# Patient Record
Sex: Female | Born: 1970 | Race: White | Hispanic: No | Marital: Married | Smoking: Never smoker
Health system: Southern US, Community
[De-identification: ages and names within clinical notes are randomized; demographics above are authoritative.]

## PROBLEM LIST (undated history)

## (undated) DIAGNOSIS — F419 Anxiety disorder, unspecified: Secondary | ICD-10-CM

## (undated) DIAGNOSIS — C44310 Basal cell carcinoma of skin of unspecified parts of face: Secondary | ICD-10-CM

## (undated) HISTORY — PX: MOHS SURGERY: SUR867

## (undated) HISTORY — DX: Basal cell carcinoma of skin of unspecified parts of face: C44.310

---

## 2002-08-22 ENCOUNTER — Other Ambulatory Visit: Admission: RE | Admit: 2002-08-22 | Discharge: 2002-08-22 | Payer: Self-pay | Admitting: Obstetrics and Gynecology

## 2003-01-23 ENCOUNTER — Encounter: Admission: RE | Admit: 2003-01-23 | Discharge: 2003-01-23 | Payer: Self-pay | Admitting: Obstetrics and Gynecology

## 2003-02-02 ENCOUNTER — Ambulatory Visit (HOSPITAL_COMMUNITY): Admission: RE | Admit: 2003-02-02 | Discharge: 2003-02-02 | Payer: Self-pay | Admitting: Obstetrics and Gynecology

## 2003-02-02 ENCOUNTER — Encounter: Payer: Self-pay | Admitting: Obstetrics and Gynecology

## 2003-03-08 ENCOUNTER — Inpatient Hospital Stay (HOSPITAL_COMMUNITY): Admission: AD | Admit: 2003-03-08 | Discharge: 2003-03-10 | Payer: Self-pay | Admitting: Obstetrics and Gynecology

## 2004-05-19 ENCOUNTER — Other Ambulatory Visit: Admission: RE | Admit: 2004-05-19 | Discharge: 2004-05-19 | Payer: Self-pay | Admitting: Obstetrics and Gynecology

## 2004-05-19 ENCOUNTER — Other Ambulatory Visit: Admission: RE | Admit: 2004-05-19 | Discharge: 2004-05-19 | Payer: Self-pay | Admitting: *Deleted

## 2004-12-07 ENCOUNTER — Inpatient Hospital Stay (HOSPITAL_COMMUNITY): Admission: AD | Admit: 2004-12-07 | Discharge: 2004-12-09 | Payer: Self-pay | Admitting: Obstetrics and Gynecology

## 2004-12-11 ENCOUNTER — Encounter: Admission: RE | Admit: 2004-12-11 | Discharge: 2005-01-10 | Payer: Self-pay | Admitting: Obstetrics and Gynecology

## 2005-01-11 ENCOUNTER — Encounter: Admission: RE | Admit: 2005-01-11 | Discharge: 2005-02-10 | Payer: Self-pay | Admitting: Obstetrics and Gynecology

## 2005-03-11 ENCOUNTER — Encounter: Admission: RE | Admit: 2005-03-11 | Discharge: 2005-04-10 | Payer: Self-pay | Admitting: Obstetrics and Gynecology

## 2005-05-11 ENCOUNTER — Encounter: Admission: RE | Admit: 2005-05-11 | Discharge: 2005-06-10 | Payer: Self-pay | Admitting: Obstetrics and Gynecology

## 2005-07-11 ENCOUNTER — Encounter: Admission: RE | Admit: 2005-07-11 | Discharge: 2005-08-10 | Payer: Self-pay | Admitting: Obstetrics and Gynecology

## 2005-08-11 ENCOUNTER — Encounter: Admission: RE | Admit: 2005-08-11 | Discharge: 2005-09-09 | Payer: Self-pay | Admitting: Obstetrics and Gynecology

## 2005-11-23 DIAGNOSIS — C44310 Basal cell carcinoma of skin of unspecified parts of face: Secondary | ICD-10-CM

## 2005-11-23 HISTORY — DX: Basal cell carcinoma of skin of unspecified parts of face: C44.310

## 2006-01-08 ENCOUNTER — Other Ambulatory Visit: Admission: RE | Admit: 2006-01-08 | Discharge: 2006-01-08 | Payer: Self-pay | Admitting: Obstetrics and Gynecology

## 2008-04-09 ENCOUNTER — Inpatient Hospital Stay (HOSPITAL_COMMUNITY): Admission: AD | Admit: 2008-04-09 | Discharge: 2008-04-12 | Payer: Self-pay | Admitting: Obstetrics and Gynecology

## 2008-04-10 ENCOUNTER — Encounter (INDEPENDENT_AMBULATORY_CARE_PROVIDER_SITE_OTHER): Payer: Self-pay | Admitting: Obstetrics and Gynecology

## 2008-10-24 ENCOUNTER — Encounter: Admission: RE | Admit: 2008-10-24 | Discharge: 2008-10-24 | Payer: Self-pay | Admitting: Family Medicine

## 2008-11-12 ENCOUNTER — Ambulatory Visit: Payer: Self-pay | Admitting: Occupational Medicine

## 2008-11-12 DIAGNOSIS — J45909 Unspecified asthma, uncomplicated: Secondary | ICD-10-CM

## 2009-04-11 ENCOUNTER — Encounter: Admission: RE | Admit: 2009-04-11 | Discharge: 2009-04-11 | Payer: Self-pay | Admitting: Obstetrics and Gynecology

## 2009-07-30 ENCOUNTER — Ambulatory Visit: Payer: Self-pay | Admitting: Family Medicine

## 2009-07-30 DIAGNOSIS — F411 Generalized anxiety disorder: Secondary | ICD-10-CM

## 2009-07-30 DIAGNOSIS — Z8742 Personal history of other diseases of the female genital tract: Secondary | ICD-10-CM

## 2009-10-14 ENCOUNTER — Ambulatory Visit: Payer: Self-pay | Admitting: Sports Medicine

## 2009-10-14 DIAGNOSIS — M779 Enthesopathy, unspecified: Secondary | ICD-10-CM | POA: Insufficient documentation

## 2009-10-14 DIAGNOSIS — IMO0002 Reserved for concepts with insufficient information to code with codable children: Secondary | ICD-10-CM | POA: Insufficient documentation

## 2009-12-12 ENCOUNTER — Encounter: Admission: RE | Admit: 2009-12-12 | Discharge: 2009-12-12 | Payer: Self-pay | Admitting: Obstetrics and Gynecology

## 2010-02-13 ENCOUNTER — Ambulatory Visit: Payer: Self-pay | Admitting: Family Medicine

## 2010-02-14 ENCOUNTER — Encounter (INDEPENDENT_AMBULATORY_CARE_PROVIDER_SITE_OTHER): Payer: Self-pay | Admitting: *Deleted

## 2010-03-04 ENCOUNTER — Ambulatory Visit (HOSPITAL_COMMUNITY): Payer: Self-pay | Admitting: Licensed Clinical Social Worker

## 2010-03-18 ENCOUNTER — Ambulatory Visit: Payer: Self-pay | Admitting: Family Medicine

## 2010-03-20 ENCOUNTER — Ambulatory Visit (HOSPITAL_COMMUNITY): Payer: Self-pay | Admitting: Licensed Clinical Social Worker

## 2010-05-20 ENCOUNTER — Ambulatory Visit (HOSPITAL_COMMUNITY): Payer: Self-pay | Admitting: Licensed Clinical Social Worker

## 2010-06-10 ENCOUNTER — Ambulatory Visit (HOSPITAL_COMMUNITY): Payer: Self-pay | Admitting: Psychiatry

## 2010-06-19 ENCOUNTER — Ambulatory Visit: Payer: Self-pay | Admitting: Family Medicine

## 2010-06-24 ENCOUNTER — Ambulatory Visit: Payer: Self-pay | Admitting: Sports Medicine

## 2010-06-24 DIAGNOSIS — M79609 Pain in unspecified limb: Secondary | ICD-10-CM

## 2010-06-24 DIAGNOSIS — R209 Unspecified disturbances of skin sensation: Secondary | ICD-10-CM

## 2010-06-27 ENCOUNTER — Ambulatory Visit: Payer: Self-pay | Admitting: Emergency Medicine

## 2010-06-27 DIAGNOSIS — S058X9A Other injuries of unspecified eye and orbit, initial encounter: Secondary | ICD-10-CM | POA: Insufficient documentation

## 2010-08-18 ENCOUNTER — Ambulatory Visit: Payer: Self-pay | Admitting: Family Medicine

## 2010-08-18 DIAGNOSIS — M25559 Pain in unspecified hip: Secondary | ICD-10-CM | POA: Insufficient documentation

## 2010-08-22 ENCOUNTER — Ambulatory Visit (HOSPITAL_COMMUNITY): Admission: RE | Admit: 2010-08-22 | Discharge: 2010-08-22 | Payer: Self-pay | Admitting: Family Medicine

## 2010-09-01 ENCOUNTER — Ambulatory Visit: Payer: Self-pay | Admitting: Family Medicine

## 2010-09-16 ENCOUNTER — Encounter: Admission: RE | Admit: 2010-09-16 | Discharge: 2010-10-14 | Payer: Self-pay | Admitting: Orthopedic Surgery

## 2010-09-17 DIAGNOSIS — N83202 Unspecified ovarian cyst, left side: Secondary | ICD-10-CM | POA: Insufficient documentation

## 2010-09-22 ENCOUNTER — Encounter: Payer: Self-pay | Admitting: Family Medicine

## 2010-10-03 ENCOUNTER — Ambulatory Visit: Payer: Self-pay | Admitting: Family Medicine

## 2010-10-04 ENCOUNTER — Encounter: Payer: Self-pay | Admitting: Family Medicine

## 2010-10-05 ENCOUNTER — Encounter: Payer: Self-pay | Admitting: Family Medicine

## 2010-10-07 ENCOUNTER — Telehealth (INDEPENDENT_AMBULATORY_CARE_PROVIDER_SITE_OTHER): Payer: Self-pay | Admitting: *Deleted

## 2010-12-23 NOTE — Progress Notes (Signed)
  Phone Note Outgoing Call Call back at [facility] Phone [phone]   Call placed by: Lajean Saver RN,  October 07, 2010 12:32 PM Call placed to: Patient Action Taken: Phone Call Completed Summary of Call: Callback: Patient not available, left message with family member for her to call back if she has any questions or concerns

## 2010-12-23 NOTE — Assessment & Plan Note (Signed)
Summary: EYE PAIN/KH   Vital Signs:  Patient Profile:   40 Years Old Female CC:      right eye scratch from bug x last night Height:     66 inches Weight:      154 pounds O2 Sat:      99 % O2 treatment:    Room Air Temp:     98 degrees F oral Pulse rate:   65 / minute Resp:     14 per minute BP sitting:   97 / 54  (right arm) Cuff size:   regular  Pt. in pain?   yes    Location:   right eye    Type:       burning  Vitals Entered By: Lajean Saver RN (June 27, 2010 8:15 AM)                   Updated Prior Medication List: SERTRALINE HCL 50 MG TABS (SERTRALINE HCL) Take 1 tablet by mouth once a day PROAIR HFA 108 (90 BASE) MCG/ACT AERS (ALBUTEROL SULFATE) inhale 2 puffs as needed MIRENA 20 MCG/24HR IUD (LEVONORGESTREL)   Current Allergies (reviewed today): No known allergies History of Present Illness Chief Complaint: right eye scratch from bug x last night History of Present Illness: Was running last night and a bug flew into her eye.  She went right home and washed it out.  Afterwards felt like either it was still in there or that it was scratched.  Eye was crusty and watery this morning.  Washing it out made it feel better but she is still blinking. No swelling, redness, but mild discomfort.  REVIEW OF SYSTEMS Constitutional Symptoms      Denies fever, chills, night sweats, weight loss, weight gain, and fatigue.  Eyes       Complains of eye pain and eye drainage.      Denies change in vision, glasses, contact lenses, and eye surgery.      Comments: right eye Ear/Nose/Throat/Mouth       Denies hearing loss/aids, change in hearing, ear pain, ear discharge, dizziness, frequent runny nose, frequent nose bleeds, sinus problems, sore throat, hoarseness, and tooth pain or bleeding.  Respiratory       Denies dry cough, productive cough, wheezing, shortness of breath, asthma, bronchitis, and emphysema/COPD.  Cardiovascular       Denies murmurs, chest pain, and tires easily  with exhertion.    Gastrointestinal       Denies stomach pain, nausea/vomiting, diarrhea, constipation, blood in bowel movements, and indigestion. Genitourniary       Denies painful urination, kidney stones, and loss of urinary control. Neurological       Denies paralysis, seizures, and fainting/blackouts. Musculoskeletal       Denies muscle pain, joint pain, joint stiffness, decreased range of motion, redness, swelling, muscle weakness, and gout.  Skin       Denies bruising, unusual mles/lumps or sores, and hair/skin or nail changes.  Psych       Denies mood changes, temper/anger issues, anxiety/stress, speech problems, depression, and sleep problems. Other Comments: patient went out to run last night and bug flew into her right eye. After immediate rinsing she feels as if it is scratched and is painful to open her eye   Past History:  Past Medical History: Reviewed history from 07/30/2009 and no changes required. 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    Past Surgical History: Reviewed history from 07/30/2009 and no changes required.  Moh's surgery to remove Basal Cell Carcinoma on nose  Family History: Reviewed history from 07/30/2009 and no changes required. Mother alive and healthy Father alive high cholesterol, HTN otherwise healthy Brother alive and healthy Sister alive anxiety, depression  otherwise healthy Ucnle with depression.  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  [place] with 2 duaghters and one son.   Never Smoked Alcohol use-yes, 1-2 Drug use-no Regular exercise-yes, running. 3x/wk Physical Exam General appearance: well developed, well nourished, no acute distress Eyes: conjunctivae and lids normal Pupils: equal, round, reactive to light Chest/Lungs: no rales, wheezes, or rhonchi bilateral, breath sounds equal without effort Heart: regular rate and  rhythm, no  murmur Right fluoroscein eye exam shows very small corneal abrasion at 6 o'clock on sclera ( ~83mm).  No dendrites, no ulcers.  No foreign bodies.  No erythema, or swelling.  Assessment New Problems: CORNEAL ABRASION, RIGHT (ICD-918.1)   Plan New Medications/Changes: CILOXAN 0.3 % SOLN (CIPROFLOXACIN HCL) 2 drops in right eye Q6 hours for 10 days  #1 bottle x 0, 06/27/2010, Hoyt Koch MD  New Orders: Est. Patient Level III [phone] Planning Comments:   Use drops as directed If any visual problems, increased redness, increased pain, go to ER or follow up with ophthomology   The patient and/or caregiver has been counseled thoroughly with regard to medications prescribed including dosage, schedule, interactions, rationale for use, and possible side effects and they verbalize understanding.  Diagnoses and expected course of recovery discussed and will return if not improved as expected or if the condition worsens. Patient and/or caregiver verbalized understanding.  Prescriptions: CILOXAN 0.3 % SOLN (CIPROFLOXACIN HCL) 2 drops in right eye Q6 hours for 10 days  #1 bottle x 0   Entered and Authorized by:   Hoyt Koch MD   Signed by:   Hoyt Koch MD on 06/27/2010   Method used:   Printed then faxed to ...       [facility]* (retail)       [address]       [address] Shipping/mailing       [place], [place]  [address]       Ph: [phone]       Fax: [phone]   RxID:   [id number]   Orders Added: 1)  Est. Patient Level III [66440]

## 2010-12-23 NOTE — Assessment & Plan Note (Signed)
Summary: FEET NUMBNESS x 2 mos   Vital Signs:  Patient profile:   40 year old female BP sitting:   98 / 62  Vitals Entered By: Lillia Pauls CMA (June 24, 2010 11:01 AM)  Primary Provider:  Nani Gasser, MD   History of Present Illness: c/o foot numbness on both feet more likely left first last year w old shoes/ loosened laces and went away  last couple of months getting worse again  training for marathon now 20 to 25 MPW numbness is worse on longer runs  starts at 2 miles/ this past sunday both feet went numb retied laces several times/ gave herself a 10 min rest at 4 miles/ picked up heels more and was able to run 4 miles w no problems at end  Allergies: No Known Drug Allergies  Physical Exam  General:  Well-developed,well-nourished,in no acute distress; alert,appropriate and cooperative throughout examination Msk:  RT and LT ankles shows no swelling; stable lateral and medial ligaments; squeeze test and kleiger test unremarkable; talar dome seems nontender; no sign of peroneal tendon subluxations; no pain at base of 5th MT.  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  Take 1 tablet by mouth once a day 2)  Retin-a 0.05 % Crea (Tretinoin) .... Apply one time a day 3)  Proair Hfa 108 (90 Base) Mcg/act Aers (Albuterol sulfate) .... Inhale 2 puffs as needed 4)  Propranolol Hcl 10 Mg Tabs (Propranolol hcl) .... Take one tablet by mouth twice a day as needed public speaking 5)  Mirena 20 Mcg/24hr Iud (Levonorgestrel)  Patient Instructions: 1)  use foot wedge w new insoles for 1 month as trial 2)  relace shoes 3)  try some calf raises with knee straight and knee bent 4)  do 1 to 3 sets of 15 on a step 5)  let me know how this works

## 2010-12-23 NOTE — Letter (Signed)
Summary: Primary Care Consult Scheduled Letter  [facility]  [address]   [place], [place] [address]   Phone: [phone]  Fax: [phone]      02/14/2010 MRN: [id number]  [facility] [address] [place], [place]  [address]    Dear Ms. Piacente,    We have scheduled an appointment for you.  At the recommendation of Dr.Metheney , we have scheduled you a consult with [facility] , Merlene Morse on April  12,2011 at 1:30pm .  [facility], [place] . The office phone number is [phone].  If this appointment day and time is not convenient for you, please feel free to call the office of the doctor you are being referred to at the number listed above and reschedule the appointment.     It is important for you to keep your scheduled appointments. We are here to make sure you are given good patient care.     Thank you, [facility] Patient Care Coordinator [place]

## 2010-12-23 NOTE — Letter (Signed)
Summary: Depression & Anxiety Questionnaire/[place] Kathryne Sharper  Depression & Anxiety Questionnaire/[place] Kathryne Sharper   Imported By: Lanelle Bal 03/28/2010 12:58:35  _____________________________________________________________________  External Attachment:    Type:   Image     Comment:   External Document

## 2010-12-23 NOTE — Assessment & Plan Note (Signed)
Summary: LEG PAIN FROM RUNNING,MC   Vital Signs:  Patient profile:   40 year old female Pulse rate:   62 / minute BP sitting:   124 / 80  (right arm)  Vitals Entered By: Lillia Pauls CMA (August 18, 2010 2:18 PM) CC: rt leg pain from knee to hip   Primary Care Provider:  Nani Gasser, MD  CC:  rt leg pain from knee to hip.  History of Present Illness: 1. Right leg pain:  Pt has been training for a marathon and has been increasing her mileage.  She was up to about 45 miles per week.  Last sunday she was going for a 15 mile run.  She began noticing some right leg pain around her hip at the last 3 miles.  After that she just had an achey pain that is hard to localize in her right leg from her hip to her knee.  She has gone running twice since last week but cannot go long distances because of the pain.  Pain is reproduced with running or jumping.  Pain is rated a 3/10.  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    Past Surgical History: Last updated: 07/30/2009 Moh's surgery to remove Basal Cell Carcinoma on nose  Family History: Last updated: 07/30/2009 Mother alive and healthy Father alive high cholesterol, HTN otherwise healthy Brother alive and healthy Sister alive anxiety, depression  otherwise healthy Ucnle with depression.  MGF with Diabetes PGF stroke  Social History: Reviewed history from 06/27/2010 and no changes required. 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  [place] with 2 duaghters and one son.   Never Smoked Alcohol use-yes, 1-2 Drug use-no Regular exercise-yes, running. 3x/wk  Physical Exam  General:  well appearing, no acute distress Msk:  Back:  Full  ROM, no swelling or pain  Right hip:  Full ROM, slightly decreased internal rotation.  Nontender to palpation over the hip joint or surrounding muscles.  Pain reproduced with jump test and short jog.  5/5 strength to hip flexors, abductors, and extensors  Right leg:  no swelling, redness or deformity.  Nontender to palpation  Right knee:  no swelling, redness or deformity.  No joint line tenderness.  Full ROM (full extension / flexion).  Neg McMurrays.  GAIT: running gait short stride length but symmetrical. no crossover of her knees from midline. heel-midfoot striker walking gait--not antalgic Neurologic:  sensation intact to soft touch B LE    Impression & Recommendations:  Problem # 1:  HIP PAIN, RIGHT (ICD-719.45) Assessment New Concerning for stress fracture given history of increased mileage and diffuse pain with impact.  Will send pt for MRI to evaluate.  Advised pt to do nonimpact training and no further running until MRI is obtained. Orders: MRI without Contrast (MRI w/o Contrast)  Complete Medication List: 1)  Sertraline Hcl 50 Mg Tabs (Sertraline hcl) .... Take 1 tablet by mouth once a day 2)  Proair Hfa 108 (90 Base) Mcg/act Aers (Albuterol sulfate) .... Inhale 2 puffs as needed 3)  Mirena 20 Mcg/24hr Iud (Levonorgestrel) 4)  Ciloxan 0.3 % Soln (Ciprofloxacin hcl) .... 2 drops in right eye q6 hours for 10 days 5)  Valium 5 Mg Tabs (Diazepam) .Marland KitchenMarland KitchenMarland Kitchen  1 by mouth q 30 minutes prior to procedure and may repeat x 1  Patient Instructions: 1)  MRI AT [facility] ON [facility], SEPT 30TH AT 9AM. [phone] Prescriptions: VALIUM 5 MG TABS (DIAZEPAM) 1 by mouth q 30 minutes prior to procedure and may repeat x 1  #2 x 0   Entered and Authorized by:   Denny Levy MD   Signed by:   Denny Levy MD on 08/18/2010   Method used:   Telephoned to ...       [facility]  [address] [phone]* (retail)       [address] [place], [place]  [address]       Ph: [phone] or [phone]       Fax: [phone]    RxID:   [id number]

## 2010-12-23 NOTE — Assessment & Plan Note (Signed)
Summary: Anxiety getting worse   Vital Signs:  Patient profile:   40 year old female Height:      66 inches Weight:      146.25 pounds BMI:     23.69 Pulse rate:   71 / minute Pulse rhythm:   regular BP sitting:   114 / 67  (left arm) Cuff size:   regular  Vitals Entered By: Mervin Kung CMA (February 13, 2010 2:54 PM) CC: room 5  Pt would like to discuss alternative to Zoloft. States she is having allergy flare up and would like rx. Also needs refill on propranolol today.   Primary Care Provider:  Nani Gasser, MD  CC:  room 5  Pt would like to discuss alternative to Zoloft. States she is having allergy flare up and would like rx. Also needs refill on propranolol today.Marland Kitchen  History of Present Illness: room 5  Pt would like to discuss alternative to Zoloft. States she is having allergy flare up and would like rx. Also needs refill on propranolol today. Has been on the zoloft consistantly for 2 years.  has alot of stress at work but feels liek she needs something for her mood. Not sleeping well lately. Can't turn her worries off at night.  Doesn' t feel down. Feels very stresses.  Feels her panic is getting worse. She is potentially interested in counseling but she i snot sure.   Allergies (verified): No Known Drug Allergies  Physical Exam  General:  Well-developed,well-nourished,in no acute distress; alert,appropriate and cooperative throughout examination Psych:  Cognition and judgment appear intact. Alert and cooperative with normal attention span and concentration. No apparent delusions, illusions, hallucinations   Impression & Recommendations:  Problem # 1:  ANXIETY DISORDER, GENERALIZED (ICD-300.02)  GAD-7 score today is  12 (moderate) PHQ-9 score today is 8 (mild). Discussed restarting teh sertraline but making sure getting to a therapuetic dose of 100mg .  Will refer for counseling. F/U in 3-4 weeks to make sure doing wel. Will repeat the GAD-7 at next visit.  I  think the counseling will help her more long term.   Her updated medication list for this problem includes:    Sertraline Hcl 100 Mg Tabs (Sertraline hcl) .Marland Kitchen... 1/2 tab by mouth for one week, then increase to a whole tab  Orders: Psychology Referral (Psychology)  Complete Medication List: 1)  Sertraline Hcl 100 Mg Tabs (Sertraline hcl) .... 1/2 tab by mouth for one week, then increase to a whole tab 2)  Retin-a 0.05 % Crea (Tretinoin) .... Apply one time a day 3)  Proair Hfa 108 (90 Base) Mcg/act Aers (Albuterol sulfate) .... Inhale 2 puffs as needed 4)  Propranolol Hcl 10 Mg Tabs (Propranolol hcl) .... Take one tablet by mouth twice a day as needed public speaking  Patient Instructions: 1)  Please schedule a follow-up appointment in 4 weeks for mood.  2)  We will make a referral for counseling as well.  Prescriptions: PROPRANOLOL HCL 10 MG TABS (PROPRANOLOL HCL) Take one tablet by mouth twice a day as needed public speaking  #30 x 1   Entered and Authorized by:   Nani Gasser MD   Signed by:   Nani Gasser MD on 02/13/2010   Method used:   Electronically to        [facility]* (retail)       [address]       [address] Shipping/mailing  [place], [place]  [address]       Ph: [phone]       Fax: [phone]   RxID:   [id number] SERTRALINE HCL 100 MG TABS (SERTRALINE HCL) 1/2 tab by mouth for one week, then increase to a whole tab  #30 x 1   Entered and Authorized by:   Nani Gasser MD   Signed by:   Nani Gasser MD on 02/13/2010   Method used:   Electronically to        [facility]* (retail)       [address]       [address] Shipping/mailing       [place], [place]  [address]       Ph: [phone]       Fax: [phone]   RxID:   [id number]   Current Allergies (reviewed today): No known allergies

## 2010-12-23 NOTE — Assessment & Plan Note (Signed)
Summary: 3 mo. f/u Anxiety   Vital Signs:  Patient profile:   40 year old female Height:      66 inches Weight:      152 pounds Pulse rate:   71 / minute BP sitting:   103 / 57  (left arm) Cuff size:   regular  Vitals Entered By: Avon Gully CMA, Duncan Dull) (June 19, 2010 2:03 PM) CC: f/u mood, pt feeling fine. concerned that she has gained wt on the medication   Primary Care Provider:  Nani Gasser, MD  CC:  f/u mood and pt feeling fine. concerned that she has gained wt on the medication.  History of Present Illness: f/u mood, pt feeling fine. concerned that she has gained wt on the medication.  she is training for a marathon and still gaining weight on the meds. Says has been eating well. had 4 seesions with Merlene Morse and thought this was helpful.   Current Medications (verified): 1)  Sertraline Hcl 100 Mg Tabs (Sertraline Hcl) .... Take 1 Tablet By Mouth Once A Day 2)  Retin-A 0.05 % Crea (Tretinoin) .... Apply One Time A Day 3)  Proair Hfa 108 (90 Base) Mcg/act Aers (Albuterol Sulfate) .... Inhale 2 Puffs As Needed 4)  Propranolol Hcl 10 Mg Tabs (Propranolol Hcl) .... Take One Tablet By Mouth Twice A Day As Needed Public Speaking 5)  Mirena 20 Mcg/24hr Iud (Levonorgestrel)  Allergies (verified): No Known Drug Allergies  Comments:  Nurse/Medical Assistant: The patient's medications and allergies were reviewed with the patient and were updated in the Medication and Allergy Lists. Avon Gully CMA, Duncan Dull) (June 19, 2010 2:09 PM)  Physical Exam  General:  Well-developed,well-nourished,in no acute distress; alert,appropriate and cooperative throughout examination   Impression & Recommendations:  Problem # 1:  ANXIETY DISORDER, GENERALIZED (ICD-300.02) Will try to taper off since doing well. See instructions. If decides to stay on the low dose and not completely taper off then f/u in 3 months.    Her updated medication list for this problem includes:  Sertraline Hcl 50 Mg Tabs (Sertraline hcl) .Marland Kitchen... Take 1 tablet by mouth once a day  Complete Medication List: 1)  Sertraline Hcl 50 Mg Tabs (Sertraline hcl) .... Take 1 tablet by mouth once a day 2)  Retin-a 0.05 % Crea (Tretinoin) .... Apply one time a day 3)  Proair Hfa 108 (90 Base) Mcg/act Aers (Albuterol sulfate) .... Inhale 2 puffs as needed 4)  Propranolol Hcl 10 Mg Tabs (Propranolol hcl) .... Take one tablet by mouth twice a day as needed public speaking 5)  Mirena 20 Mcg/24hr Iud (Levonorgestrel)  Patient Instructions: 1)  Drop the sertraline down to 50mg  a day for one month. If doing well then can cut in half for 2 weeks and then stop.   Prescriptions: SERTRALINE HCL 50 MG TABS (SERTRALINE HCL) Take 1 tablet by mouth once a day  #30 x 1   Entered and Authorized by:   Nani Gasser MD   Signed by:   Nani Gasser MD on 06/19/2010   Method used:   Electronically to        [facility]* (retail)       [address]       [address] Shipping/mailing       [place], [place]  [address]       Ph: [phone]       Fax: [phone]   RxID:   [id number]

## 2010-12-23 NOTE — Assessment & Plan Note (Signed)
Summary: RASH ON CHEST/WB (rm 5)   Vital Signs:  Patient Profile:   40 Years Old Female CC:      rash on chest x 2 days Height:     66 inches Weight:      149 pounds O2 Sat:      99 % O2 treatment:    Room Air Temp:     98.8 degrees F oral Pulse rate:   86 / minute Pulse rhythm:   regular Resp:     14 per minute BP sitting:   113 / 67  (left arm) Cuff size:   regular  Pt. in pain?   no  Vitals Entered By: Lajean Saver RN (October 03, 2010 8:14 AM)                   Updated Prior Medication List: SERTRALINE HCL 50 MG TABS (SERTRALINE HCL) Take 1 tablet by mouth once a day PROAIR HFA 108 (90 BASE) MCG/ACT AERS (ALBUTEROL SULFATE) inhale 2 puffs as needed MIRENA 20 MCG/24HR IUD (LEVONORGESTREL)   Current Allergies: No known allergies History of Present Illness Chief Complaint: rash on chest x 2 days History of Present Illness:  Subjective:  Patient complains of onset of cold-like symptoms about 4 days ago with sinus congestion and sore throat.  Two days ago she developed a rash on her upper anterior chest that has gradually spread to her back.  The rash does not itch.  She still has a mild sore throat and sinus congestion; minimal cough.  No fevers, chills, and sweats.  She feels well otherwise.  Denies hot tub use.  REVIEW OF SYSTEMS Constitutional Symptoms      Denies fever, chills, night sweats, weight loss, weight gain, and fatigue.  Eyes       Denies change in vision, eye pain, eye discharge, glasses, contact lenses, and eye surgery. Ear/Nose/Throat/Mouth       Complains of frequent nose bleeds.      Denies hearing loss/aids, change in hearing, ear pain, ear discharge, dizziness, frequent runny nose, sinus problems, sore throat, hoarseness, and tooth pain or bleeding.      Comments: congestion Respiratory       Denies dry cough, productive cough, wheezing, shortness of breath, asthma, bronchitis, and emphysema/COPD.  Cardiovascular       Denies murmurs, chest  pain, and tires easily with exhertion.    Gastrointestinal       Denies stomach pain, nausea/vomiting, diarrhea, constipation, blood in bowel movements, and indigestion. Genitourniary       Denies painful urination, kidney stones, and loss of urinary control. Neurological       Denies paralysis, seizures, and fainting/blackouts. Musculoskeletal       Denies muscle pain, joint pain, joint stiffness, decreased range of motion, redness, swelling, muscle weakness, and gout.  Skin       Denies bruising, unusual mles/lumps or sores, and hair/skin or nail changes.      Comments: chest/upper back Psych       Denies mood changes, temper/anger issues, anxiety/stress, speech problems, depression, and sleep problems. Other Comments: Patient c/o rash to chest and upper back x 2 days. It is made up as multiple small red bumps, she denies any itching. She noted that the rash developed about the same time as she developed runny nose and congestin   Past History:  Past Medical History: Reviewed history from 07/30/2009 and no changes required. 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    Past Surgical  History: Reviewed history from 07/30/2009 and no changes required. Moh's surgery to remove Basal Cell Carcinoma on nose  Family History: Reviewed history from 07/30/2009 and no changes required. Mother alive and healthy Father alive high cholesterol, HTN otherwise healthy Brother alive and healthy Sister alive anxiety, depression  otherwise healthy Ucnle with depression.  MGF with Diabetes PGF stroke  Social History: Reviewed history from 06/27/2010 and no changes required. 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  [place] with 2 duaghters and one son.   Never Smoked Alcohol use-yes, 1-2 Drug use-no Regular exercise-yes, running. 3x/wk   Objective:  Appearance:  Patient appears healthy, stated age, and in no acute distress  Skin:  On upper anterior  chest extending to neck is confluent macular erythema.  On shoulders and back the rash transitions to follicular lesions with a tiny central pustule.  No involvement of extremities or abdomen Eyes:  Pupils are equal, round, and reactive to light and accomdation.  Extraocular movement is intact.  Conjunctivae are not inflamed.  Ears:  Canals normal.  Tympanic membranes normal.   Nose:  Normal septum.  Normal turbinates, mildly congested.    No sinus tenderness present.  Pharynx:  Minimal erythema. Neck:  Supple.  No adenopathy is present.   Lungs:  Clear to auscultation.  Breath sounds are equal.  Heart:  Regular rate and rhythm without murmurs, rubs, or gallops.  Abdomen:  Nontender without masses or hepatosplenomegaly.  Bowel sounds are present.  No CVA or flank tenderness.  CBC: WBC 5.3 Rapid strep test negative  Assessment New Problems: ACUTE PHARYNGITIS (ICD-462) FOLLICULITIS (ICD-704.8) UPPER RESPIRATORY INFECTION, ACUTE (ICD-465.9)  RASH IS SUGGESTIVE OF SCARLET FEVER. HOWEVER, SUSPECT VIRAL URI AND BACTERIAL FOLLICULITIS  Plan New Medications/Changes: CEPHALEXIN 500 MG CAPS (CEPHALEXIN) One by mouth two times a day  #14 x 0, 10/03/2010, Donna Christen MD  New Orders: T-Culture, Throat [[id number]] Rapid Strep [35009] CBC w/Diff [[id number]] Est. Patient Level IV [69678] Planning Comments:   Begin Keflex.  Throat culture pending. Given a Water quality scientist patient information and instruction sheet on topic folliculitis. Treat URI symptomatically:  expectorant/decongestant, cough suppressant at bedtime. Follow-up with PCP if not improving.   The patient and/or caregiver has been counseled thoroughly with regard to medications prescribed including dosage, schedule, interactions, rationale for use, and possible side effects and they verbalize understanding.  Diagnoses and expected course of recovery discussed and will return if not improved as expected or if the condition worsens. Patient  and/or caregiver verbalized understanding.  Prescriptions: CEPHALEXIN 500 MG CAPS (CEPHALEXIN) One by mouth two times a day  #14 x 0   Entered and Authorized by:   Donna Christen MD   Signed by:   Donna Christen MD on 10/03/2010   Method used:   Print then Give to Patient   RxID:   [id number]   Patient Instructions: 1)  May use Mucinex D (guaifenesin with decongestant) twice daily for congestion. 2)  Increase fluid intake, rest. 3)  May use Afrin nasal spray (or generic oxymetazoline) twice daily for about 5 days.  Also recommend using saline nasal spray several times daily and/or saline nasal irrigation. 4)  If cough becomes worse at night, Delsym Cough Suppressant may be helpful at bedtime. 5)  Followup with family doctor if not improving one week.   Orders Added: 1)  T-Culture, Throat [[id number]] 2)  Rapid Strep [44315] 3)  CBC w/Diff [[id number]] 4)  Est. Patient Level IV [09326]  Laboratory Results  Date/Time Received: October 03, 2010 8:45 AM  Date/Time Reported: October 03, 2010 8:45 AM   Other Tests  Rapid Strep: negative  Kit Test Internal QC: Negative   (Normal Range: Negative)

## 2010-12-23 NOTE — Assessment & Plan Note (Signed)
Summary: 1 MONTH FU Anxiety   Vital Signs:  Patient profile:   40 year old female Height:      66 inches Weight:      148 pounds Pulse rate:   70 / minute BP sitting:   116 / 57  (left arm) Cuff size:   regular  Vitals Entered By: Kathlene November (March 18, 2010 1:59 PM) CC: followup mood. Pt states feeling better- would like to decrease dose to 50mg - feels like she would do ok on the lower dose   Primary Care Provider:  Nani Gasser, MD  CC:  followup mood. Pt states feeling better- would like to decrease dose to 50mg - feels like she would do ok on the lower dose.  History of Present Illness: followup mood. Pt states feeling better- would like to decrease dose to 50mg - feels like she would do ok on the lower dose. Sleeping OK. No S.E from the doses except has gained about 2 lLBs and she is worried it may be from the medication. Did go to see Darel Hong once. Has an appt later this week.    Current Medications (verified): 1)  Sertraline Hcl 100 Mg Tabs (Sertraline Hcl) .... 1/2 Tab By Mouth For One Week, Then Increase To A Whole Tab 2)  Retin-A 0.05 % Crea (Tretinoin) .... Apply One Time A Day 3)  Proair Hfa 108 (90 Base) Mcg/act Aers (Albuterol Sulfate) .... Inhale 2 Puffs As Needed 4)  Propranolol Hcl 10 Mg Tabs (Propranolol Hcl) .... Take One Tablet By Mouth Twice A Day As Needed Public Speaking 5)  Mirena 20 Mcg/24hr Iud (Levonorgestrel)  Allergies (verified): No Known Drug Allergies  Comments:  Nurse/Medical Assistant: The patient's medications and allergies were reviewed with the patient and were updated in the Medication and Allergy Lists. 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   GAD-7 score is 4 today (much improved) and PHQ-9 score is 3 (normal), thus she is fully in remission on both. Discussed staying on the 100mg  for one more month and then if dong  welll can try to drop down to 50mg  daily and see if sxs stay well congtrolled. If needs to stop med for any reason asked her to call and have [place] help her taper it. F/ u in 3 mo.  Her updated medication list for this problem includes:    Sertraline Hcl 100 Mg Tabs (Sertraline hcl) .Marland Kitchen... Take 1 tablet by mouth once a day  Complete Medication List: 1)  Sertraline Hcl 100 Mg Tabs (Sertraline hcl) .... Take 1 tablet by mouth once a day 2)  Retin-a 0.05 % Crea (Tretinoin) .... Apply one time a day 3)  Proair Hfa 108 (90 Base) Mcg/act Aers (Albuterol sulfate) .... Inhale 2 puffs as needed 4)  Propranolol Hcl 10 Mg Tabs (Propranolol hcl) .... Take one tablet by mouth twice a day as needed public speaking 5)  Mirena 20 Mcg/24hr Iud (Levonorgestrel)  Patient Instructions: 1)  Please schedule a follow-up appointment in 3 months for mood.  Prescriptions: SERTRALINE HCL 100 MG TABS (SERTRALINE HCL) Take 1 tablet by mouth once a day  #30 x 2   Entered and Authorized by:   Nani Gasser MD   Signed by:   Nani Gasser MD on 03/18/2010   Method used:   Electronically to        [facility]  [address] [phone]* (retail)  [address]       [place], [place]  [address]       Ph: [phone] or [phone]       Fax: [phone]   RxID:   [id number]

## 2010-12-23 NOTE — Letter (Signed)
Summary: Handout Printed  Printed Handout:  - Rheumatic Fever 

## 2010-12-23 NOTE — Assessment & Plan Note (Signed)
Summary: 3:30 APPT,LEG PAIN,MC   Vital Signs:  Patient profile:   40 year old female Height:      66 inches Weight:      145 pounds Pulse rate:   64 / minute BP sitting:   115 / 71  (right arm)  Vitals Entered By: Rochele Pages RN (September 01, 2010 3:33 PM) CC: f/u rt leg pain    Primary Care Jayvien Rowlette:  Nani Gasser, MD  CC:  f/u rt leg pain .  History of Present Illness: Contniued right hip / leg pain. It improved some when she was not doing any running. Yesterday she ran 2 miles and is having fauirly significant pain during latter part of her run.  Pain has improved overnigt but still nagging. Pain remians located in hip jointb wit radiation down from of thigh.  Current Medications (verified): 1)  Sertraline Hcl 50 Mg Tabs (Sertraline Hcl) .... Take 1 Tablet By Mouth Once A Day 2)  Proair Hfa 108 (90 Base) Mcg/act Aers (Albuterol Sulfate) .... Inhale 2 Puffs As Needed 3)  Mirena 20 Mcg/24hr Iud (Levonorgestrel) 4)  Ciloxan 0.3 % Soln (Ciprofloxacin Hcl) .... 2 Drops in Right Eye Q6 Hours For 10 Days  Allergies: No Known Drug Allergies  Physical Exam  General:  alert, well-developed, well-nourished, and well-hydrated.     Hip Exam  Hip Exam:    Right:    Inspection:  Normal    Palpation:  Normal    Stability:  stable    Tenderness:  no    Swelling:  no    Erythema:  no    Cannot elicit pain with any particular maneuver. Has FROm IR/ER extension and flexion. Pain with hop test.   Impression & Recommendations:  Problem # 1:  HIP PAIN, RIGHT (ICD-719.45) very unclear. Pain with weight bearing activity, normal MRI. Will refer to ortho for further eval. we discussed at length her decisions re upcoming marathon (1 m away and would be her first0 and whether or not she should attempt this. I think ortho eval will help give her more info.  Complete Medication List: 1)  Sertraline Hcl 50 Mg Tabs (Sertraline hcl) .... Take 1 tablet by mouth once a day 2)  Proair Hfa  108 (90 Base) Mcg/act Aers (Albuterol sulfate) .... Inhale 2 puffs as needed 3)  Mirena 20 Mcg/24hr Iud (Levonorgestrel) 4)  Ciloxan 0.3 % Soln (Ciprofloxacin hcl) .... 2 drops in right eye q6 hours for 10 days  Patient Instructions: 1)  Your appt is with Dr. Jodi Geralds at [facility] on Thurs October 13th at 2pm. [phone]. [address]

## 2010-12-25 NOTE — Consult Note (Signed)
Summary: [facility]   Imported By: Marily Memos 11/03/2010 10:44:26  _____________________________________________________________________  External Attachment:    Type:   Image     Comment:   External Document

## 2011-04-07 NOTE — H&P (Signed)
[name], [name]             ACCOUNT NO.:  [id number]   MEDICAL RECORD NO.:  [id number]          PATIENT TYPE:  INP   LOCATION:  9163                          FACILITY:  [facility]   PHYSICIAN:  Crist Fat. Rivard, M.D. DATE OF BIRTH:  09/05/1971   DATE OF ADMISSION:  04/09/2008  DATE OF DISCHARGE:                              HISTORY & PHYSICAL   The patient is a 40 year old, married, white female, gravida 3, para 2-0-  0-2, who presents at 40-1/7 weeks for an [facility] of Apr 08, 2008 with chief  complaint of constant upper abdominal pain with onset earlier in the day  and extreme sensitivity and upper abdomen and abdominal muscles.  She  reports difficulty in changing her position.  She does have some  improvement with her arms above her head.  She reports being very  thirsty today.  She denies vaginal bleeding or leakage of fluid.  She  reports positive fetal movement.  No PIH, UTI or other signs and  symptoms of infection noted.  She reports some nausea, some indigestion.  She reports contractions are fairly frequent as well.  She did eat a  regular supper approximately 6 p.m. She was followed by CNM service at  [facility]. Her history is remarkable for:  1. History of panic attacks.  2. Asthma.  3. Gestational diabetes with gravida 1 that was diet controlled.   OBSTETRICAL HISTORY:  Gravida 1 was spontaneous vaginal delivery of a  female infant weighing 7 pounds 11 ounces at 38-4/7 weeks.  That was in  April 2004.  She had Stadol for anesthesia.  She has a second degree  laceration and it was complicated by gestational diabetes which was diet  controlled.  Her daughter's name is Sue [name].   Gravida 2 was spontaneous vaginal delivery at 39 weeks in January 2006.  Female infant named Sue [name] and weight was 7 pounds 11 ounces.  She reports  only using Stadol with that.  She had about 10 hours of labor with both  of those deliveries.   Gravida 3 is current pregnancy.   ALLERGIES:  She  denies medication or latex allergies.   PAST MEDICAL HISTORY:  1. She reports menarche at age 89, 62-30 day cycles and last menstrual      period July 03, 2007, giving her an [facility] of Apr 08, 2008.  2.  She      reports use of NuvaRing and Micronor in the past for contraception.  2. Occasional yeast infection.  3. She was group beta strep positive with both her previous      pregnancies, has been negative this time.  4. She had an outbreak of shingles in 2001.  5. Diet controlled gestational diabetes with gravida 1.  6. Exercise and illness induced asthma.  She does have a p.r.n.      metered-dose inhaler.  Asthma is well controlled.  7. Panic attacks and anxiety for which she takes Zoloft 50 mg every      day.  8. She has had a fracture of her leg in 1994.  9. Sports concussion July 25, 1990.  10.Wisdom teeth excision 1998.  11.Basal cell carcinoma of the right side of her nose which was      excised in December 2006.  12.She does have some trouble with nausea and vomiting after      anesthesia.  13.She has only been hospitalized for childbirth with her two previous      pregnancies.   FAMILY HISTORY:  Paternal grandfather with heart disease.  Paternal  uncle and three cousins with chronic hypertension.  Maternal grandfather  with adult  onset diabetes.  Maternal grandmother has Alzheimer's.  Paternal grandmother with colorectal cancer.  Maternal grandfather,  brain cancer.  Brother and sister with those anxiety and panic attacks.  The patient's sister had intrahepatic cholestasis with her third child.   GENETIC HISTORY:  Unremarkable.   SOCIAL HISTORY:  She is married white female.  Husband's name is Sue [name].  They are of Catholic faith.  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  Father of  the  baby is a Arts development officer.  Has a bachelor's degree.  She denied  alcohol, tobacco or illicit drug use.  She uses Claritin  10 daily as  well as having the sertraline and she has an albuterol metered-dose  inhaler that she used as needed.   PRENATAL LABS:  Blood type O positive, Rh antibody screen negative, RPR  nonreactive, rubella titer immune.  Hepatitis surface antigen negative,  HIV nonreactive.  Cystic fibrosis screen negative.  Gonorrhea and  chlamydia cultures negative.  Hemoglobin at her new OB was 14, platelets  were 195,000.  Group beta strep culture negative.  Change in her 1-hour  GTT which had an early Glucola she failed.  It was 154.  Her 3-hour GTT  which was then followed was within normal limits and the results fasting  was 80, 1 hour was 141, 2 hour was 144, and 3 hour was 105.  She then  had repeat 1-hour GTT around 28 weeks which she again did not pass.  It  was 166 and then she had a follow-up 3-hour GTT and those were within  normal limits with a history of present pregnancy.  She entered care at  approximately 10-2/7 weeks in October 2008.  She did desire first  trimester screen which she had done on November 4 which was within  normal limits.  She was treated for yeast infection November 18;  otherwise was doing well.  Had anatomy ultrasound at 18-2/7 weeks  showing  SIUP with size equal to dates.  Cervix was 4.22 cm.  All  anatomy was within normal limits with normal growth and development.  Had some complaints late second trimester of restless leg syndrome.  She  was doing well on her Zoloft 50 mg per day. The patient's pregnancy  continued to progress from that point without any complications.  Most  recently in the office cervix was 1 cm.  She had not had any other  ultrasounds since her 18-week scan and was measuring size equal to  dates.   PHYSICAL EXAMINATION:  VITAL SIGNS:  On admission to MAU, her vital  signs were blood pressure 124/80, heart rate 90, temperature 97.5,  respirations were 20. On EFM fetal heart rate was 145 initially with  minimal variability, was  nonreactive but there were 2 to 3 just very  mild variable decelerations.  Toco was tachysystole and uterine  irritability.  There was minimal relaxation between contractions on  palpation.  Bedside ultrasound was obtained and vertex presentation was  noted.  GENERAL:  Alert and oriented x3, slightly anxious.  Noted discomfort,  particularly with position changes and contractions.  She was grossly  intact within normal limits.  CARDIOVASCULAR:  Regular rate and rhythm without murmur.  LUNGS:  Clear to auscultation bilaterally.  ABDOMEN:  Upper abdomen was hard and tender to touch.  She was guarded  with her contractions.  Estimated fetal weight 7-1/2 to 8 hours.  PELVIC:  Cervix 2-3 cm, 70%, minus 2 initially.  The cervix was more  anterior on second exam, unchanged but was more posterior and to the  patient's right.  EXTREMITIES:  Within normal limits with some mild edema.  DTRs 2+.  No  clonus.   Secondary to tachysystole the patient was given IV fluid bolus and some  oxygen secondary to nonreactive NST.  After further observation,  ultrasound was obtained and showed AFI of 27.1 which is polyhydramnios,  greater than 97th percentile and BPP was 6/10 with no fetal breathing  movements observed  The patient had a difficult time with ultrasound  secondary to upper abdominal discomfort, actually asked the sonographer  to stop the ultrasound several times because of pain from the transducer  with trying to take a look at the baby for the BPP.  Following the  ultrasound, the patient was placed back on monitor and consulted with  Dr. Estanislado Pandy  regarding the patient's status.  Before talking with Dr.  Estanislado Pandy, the patient had stated I don't feel comfortable when home with  the pain.  She voiced desire for least intervention possible and  awaiting spontaneous labor.  She was amenable to AROM but not Pitocin at  the time and desired rest overnight and reevaluation in a.m.  She was  having some  indigestion and had emesis episode x1.  Discussed with the  patient the risk of AROM with ballottable presentation and possible cord  prolapse and per Dr. Cloretta Ned recommendation, admit to 23-hour  observation with continuous monitoring and reevaluation in the a.m. for  spontaneous labor and rediscussion of induction of labor in a.m.  secondary to term pregnancy with polyhydramnios.  The patient is  agreeable with plan.  If the patient is without spontaneous labor in the  a.m., Dr. Estanislado Pandy recommends repeat ultrasound for another BPP and AFI.  Just before 1 a.m., fetal heart rate with baseline around 140, still  minimal to moderate variability, but is now reactive.  Contractions  continue every 1-3 minutes with some spacing.  She did have a CBC that  was drawn and her IV was placed.  Her white count is slightly elevated  at 16.4.  Hemoglobin is 10.9, hematocrit 32.6 and platelets were stable  at 187,000.   IMPRESSION:  1. Intrauterine pregnancy at 40-2/7 weeks.  2. Polyhydramnios at term.  3. Elevated white blood cell count without other signs or symptoms of      infection.  4. Now reactive NST which was prior nonreactive after extended      monitoring.  5. Biophysical profile 6 out of 10 which would now be 8 out of 10.  6. Tachysystole with some spacing.   PLAN:  1. Admit to birthing suite for 23-hour observation with Dr. Estanislado Pandy as      attending physician.  2. She will have continuous monitoring, saline lock IV at present with      regular diet.  She has a list of p.r.n. medications for rest  and      discomfort and plan to allow her to rest overnight and reevaluate      in a.m. for spontaneous labor for possible induction of labor.      Candice [place], CNM      Dois Davenport A. Rivard, M.D.  Electronically Signed    CHS/MEDQ  D:  04/10/2008  T:  04/10/2008  Job:  [id number]

## 2011-04-10 NOTE — H&P (Signed)
NAME:  Sue [name], Sue [name]                       ACCOUNT NO.:  [id number]   MEDICAL RECORD NO.:  [id number]                   PATIENT TYPE:  INP   LOCATION:  9165                                 [facility]ILITY:  [facility]   PHYSICIAN:  Naima A. Dillard, M.D.              DATE OF BIRTH:  11-Dec-1970   DATE OF ADMISSION:  03/08/2003  DATE OF DISCHARGE:                                HISTORY & PHYSICAL   REASON FOR ADMISSION:  The patient is a 40 year old married white female,  gravida at 1 and 4/7th weeks who presents with regular uterine contractions  all evening.  She denies leaking, bleeding, headache, nausea, vomiting,  visual disturbances.  Her pregnancy has been followed by [facility]  OB/GYN MD Service and it has been remarkable for a history of panic attacks,  class A diabetic, group B strep positive.  Her ultrasound at 34-weeks showed  estimated fetal weight in the 90th-96th percentile.  Her prenatal labs were  collected on August 02, 2002.  Hemoglobin 13.3, hematocrit 39.2,  platelets 215,000.  Blood type O positive.  Antibody negative.  RPR  nonreactive.  Rubella immune.  Hepatitis B surface antigen negative.  Past  ________ normal limits.  Gonorrhea negative.  Chlamydia negative.  TSH  within normal limits.  Return of CMF  S protein was within normal limits.  One hour Glucola on January 01, 2003 was 162 with an abnormal three hour GTT  and her hemoglobin on January 01, 2003 was 11.1.  Culture of the vaginal  tract for group B strep on February 22, 2003 was positive.   HISTORY OF PRESENT PREGNANCY:  She presented for care at [facility] on  December 25, 2002 at 28 and 2/7th weeks as a transfer from [facility].  She had an abnormal one hour Glucola at [redacted] weeks  gestation and a three hour GTT on February 18 which was abnormal.  She was  then transferred to the MD's service from the [facility] service.  Her gestational  diabetes was diet controlled and was well  managed with her fasting blood  sugar and two hour PCs within normal limits for the most part.  She was  measured in size greater then date at [redacted] weeks gestation and her ultrasound  showed growth in the 90th-96th percentile.  The rest of her prenatal care  was unremarkable.   OBSTETRIC HISTORY:  She is a primigravida.   ALLERGIES:  She has no medication allergies.   PAST MEDICAL HISTORY:  1. She reports having had the usual childhood illnesses.  2. She has occasional yeast infections.  3. She has exercise or illness induced asthma which she uses an inhaler as     needed.  4. She reports a history of panic attacks and anxiety.  5. She fractured her leg in 1994 in a sports injury.  6. Had a D&C in 1991.   PAST  SURGICAL HISTORY:  Remarkable for wisdom teeth extraction in 1998.   FAMILY MEDICAL HISTORY:  Paternal grandfather with MI.  Paternal uncle and  cousins with chronic hypertension.  Maternal grandfather with adult onset  diabetes.  Paternal grandmother with colorectal cancer.  Maternal  grandfather with brain cancer.  Paternal grandfather with history of CVA.  Maternal grandmother with Alzheimer's.  Brother and sister with panic  attacks and anxiety.  Genetic history:  Unremarkable.   SOCIAL HISTORY:  Patient is married to the father of the baby.  His name is  Jeremi.  They are of the Catholic faith.  They are both college educated and  employed full time.  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  They deny any alcohol, tobacco or illicit drug use  with the pregnancy.   OBJECTIVE DATA:  VITAL SIGNS:  Stable.  She is afebrile.  HEENT:  Grossly within normal limits.  CHEST:  Clear to auscultation.  HEART:  Regular, rate and rhythm.  ABDOMEN:  Gravid with fundal height extending approximately 39 cm above the  pubic symphysis.  Electronic fetal monitoring shows reassuring fetal heart  rate.  Uterine contractions every 2-3 half minutes,  moderate in quality.  PELVIC:  Cervical exam, 5 cm, 90% effaced.  Vertex minus one with intact bag  of water.  EXTREMITIES:  Within normal limits.   ASSESSMENT:  1. Pregnancy at term.  2. Gestational diabetes mellitus.  3. Active labor.  4. Group B strep positive.   PLAN:  1. Admit to birthing suite for a consult with Dr. Normand Sloop.  2. Routine MD orders.  3. Plan group B strep prophylaxis with penicillin.     Cam Hai, C.N.M.                     Naima A. Normand Sloop, M.D.    KS/MEDQ  D:  03/08/2003  T:  03/08/2003  Job:  [id number]

## 2011-04-10 NOTE — H&P (Signed)
Sue [name], Sue [name]             ACCOUNT NO.:  [id number]   MEDICAL RECORD NO.:  [id number]          PATIENT TYPE:  INP   LOCATION:  9170                          FACILITY:  [facility]   PHYSICIAN:  Janine Limbo, M.D.DATE OF BIRTH:  03-29-1971   DATE OF ADMISSION:  12/07/2004  DATE OF DISCHARGE:                                HISTORY & PHYSICAL   HISTORY OF PRESENT ILLNESS:  The patient is a 40 year old gravida 2, para 1-  0-0-1, at [redacted] weeks gestation, EDD December 14, 2004, who presents for labor  evaluation.   NO FURTHER DICTATION     [name]   SDM/MEDQ  D:  12/07/2004  T:  12/07/2004  Job:  [id number]

## 2011-04-10 NOTE — H&P (Signed)
NAMESUVI, Sue [name]             ACCOUNT NO.:  [id number]   MEDICAL RECORD NO.:  [id number]          PATIENT TYPE:  INP   LOCATION:  9170                          [facility]ILITY:  [facility]   PHYSICIAN:  Janine Limbo, M.D.DATE OF BIRTH:  12-Nov-1971   DATE OF ADMISSION:  12/07/2004  DATE OF DISCHARGE:                                HISTORY & PHYSICAL   HISTORY OF PRESENT ILLNESS:  The patient is a 40 year old gravida 2, para 1-  0-0-1, at [redacted] weeks gestation, EDD December 14, 2004, who presents for labor  evaluation with contractions every three to four minutes.  Her contractions  began at approximately 4:30 a.m. this morning and have become stronger and  more regular.  She reports positive fetal movement, no bleeding, and no  rupture of membranes and denies any PIH symptoms, no headache, visual  changes, or epigastric pain.  Her pregnancy has been followed by the C.N.M.  service at [facility] and is remarkable for:  1) History of panic attacks.  2)  History of gestational diabetes.  3) Asthma.  4) Positive Group B Strep.  This patient began prenatal care at the office of [facility] on May 19, 2004, at  [redacted] weeks gestation.  Her pregnancy has been unremarkable.  She has been size  equal to dates throughout, normotensive with no proteinuria.   PAST OBSTETRICAL HISTORY:  In 2004, the patient had a normal spontaneous  vaginal delivery with the birth of a 7 pound 11 ounce female infant named  Arna Medici with no complications.  The patient was gestational diabetic with that  pregnancy.   PAST MEDICAL HISTORY:  The patient had shingles in 2001.  The patient has a  history of asthma and uses an inhaler p.r.n.  She has a history of panic  attacks and is currently not taking any medication.  She had her wisdom  teeth removed in 1998.   FAMILY HISTORY:  Paternal grandfather with a history of heart disease.  Paternal grandmother with colorectal cancer and maternal grandfather with  brain cancer.  Maternal  grandmother with Alzheimer'Sue disease.   GENETIC HISTORY:  There is no genetic history of familial or chromosomal  disorders, children that died in infancy or that were born with birth  defects.   ALLERGIES:  No known drug allergies.   SOCIAL HISTORY:  She denies the use of tobacco, alcohol, or illicit drugs.  The patient is a 40 year old married Caucasian female.  She works in the  [employer] at [employer].  Her husband, Felica Chargois, is a Runner, broadcasting/film/video.  He is involved and supportive.  They are catholic on their faith.   PRENATAL LABORATORY DATA:  On May 19, 2004, hemoglobin and hematocrit 13.8  and 41.4, platelets 207,000.  Blood type and Rh O positive, antibody screen  negative.  VDRL nonreactive.  Rubella immune.  Hepatitis B surface antigen  negative.  Pap smear within normal limits.  GC and Chlamydia negative.  At  28 weeks, one-hour Glucose Challenge 138.  She did have three-hour GTT on  October 03, 2004, which was within normal limits.  At 36 weeks, culture  of  the vaginal tract is positive for Group B Strep.  The patient will be  treated in labor with penicillin.   REVIEW OF SYSTEMS:  As described above.  The patient is typical of one with  a uterine pregnancy at term in early labor.   PHYSICAL EXAMINATION:  VITAL SIGNS:  Stable, afebrile.  HEENT:  Unremarkable.  HEART:  Regular rate and rhythm.  LUNGS:  Clear.  ABDOMEN:  Gravid in its contour.  Uterine fundus is noted to extend 39 cm  above the level of the pubic symphysis.  Leopold'Sue maneuver finds the infant  to be in a longitudinal lie, cephalic presentation, and the estimated fetal  weight is 8 pounds.  Baseline of the fetal heart rate monitor is 140 to 150  with average longterm variability.  Reactivity is present with no periodic  changes.  The patient is contracting every two to four minutes.  Digital  examination of the cervix finds it to be 3 to 4 cm dilated, 90% effaced,  with a cephalic presenting part at  a -2 station and a bulging bag of water  is present.  EXTREMITIES:  No pathologic edema.  DTR'Sue are 1+ with no clonus.   ASSESSMENT:  1.  Intrauterine pregnancy at term.  2.  Early labor.   PLAN:  Admit per Janine Limbo, M.D.  Routine C.N.M. orders.  Start  penicillin-G prophylaxis for positive Group B Strep.  Will discuss pain  medicine further as the patient desires.     Sue [name]   SDM/MEDQ  D:  12/07/2004  T:  12/07/2004  Job:  [id number]

## 2011-05-21 ENCOUNTER — Ambulatory Visit (INDEPENDENT_AMBULATORY_CARE_PROVIDER_SITE_OTHER): Payer: Self-pay | Admitting: Family Medicine

## 2011-05-21 ENCOUNTER — Encounter: Payer: Self-pay | Admitting: Family Medicine

## 2011-05-21 DIAGNOSIS — R209 Unspecified disturbances of skin sensation: Secondary | ICD-10-CM

## 2011-05-21 DIAGNOSIS — M62838 Other muscle spasm: Secondary | ICD-10-CM

## 2011-05-21 DIAGNOSIS — M62831 Muscle spasm of calf: Secondary | ICD-10-CM

## 2011-05-21 NOTE — Patient Instructions (Signed)
Your calf spasms typically are the result of weakness in musculature leading to spasms. Do calf raises - start with 3 sets of 6 each direction (toes in, straight, and out) once a day without weights and work your way up to 3 sets of 10. Theraband ankle strengthening exercises 3 sets of 10 in each of 3 directions as well once a day. Calf sleeve for compression will help keep muscle warm and supported, less risk of spasms. Ice calves down for 15 minutes at a time at end of runs. Cross train for next few days then start back into a walk:jog program for 10 minutes, 1:1 walk:jog.  Increase jog time by 5 minutes each time, jog time by 1 minute each time.  For hydration, drink 20 ounces of water 2 hours prior to run, 6 ounces 15 minutes prior to run, then generally it's recommended you drink 4 ounces every 15-30 minutes during your runs. Add a tablespoon of salt to a bottle of gatorade if you are getting cramps still and drink one of these on days of your runs (typically at least a half hour before your run). Pickle juice, beef broth, pretzels, are all other things that will help increase sodium levels and decrease risk of cramps.  For feet numbness this is generally related to one of two things: compressive neuropathy (swelling of feet compressing skin nerves that go into foot) or tarsal tunnel syndrome (nerve on inside of ankles being stretched). Make sure your shoes are not too tight when you run - if they are, loosen laces.  If they're not, take one of the laces out to allow for more expansion in your shoes. You may have to switch to the new shoes to see if they're better in this. Try the insoles with scaphoid pads instead - use them regularly for at least 3 weeks to know if they're making a difference. You can buy these insoles from [employer] if they're working well for you. Follow up with me in 1 month for a recheck on how you're doing.

## 2011-05-22 ENCOUNTER — Encounter: Payer: Self-pay | Admitting: Family Medicine

## 2011-05-22 DIAGNOSIS — M62831 Muscle spasm of calf: Secondary | ICD-10-CM | POA: Insufficient documentation

## 2011-05-22 NOTE — Assessment & Plan Note (Signed)
1.  R calf spasms/strain  - Pt shown calf strengthening exercises to do everyday. Provided with a calf sleeve for compression and warmth.  Tylenol or NSAIDS  Prn.  Icing after activities.  Will ease her back into a walk/jog program as her pain allows.  See instructions for further.

## 2011-05-22 NOTE — Assessment & Plan Note (Signed)
2.  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 Numbness is not in typical tarsal tunnel distribution and she has neg Tinel's today.  We discussed ways to relieve circumferential pressure on her ankle that is likely contributing to her numbness.  She does have mile over-pronation of bilateral long arches.  Given sports insoles with scaphoid pads to prevent possible stretching of tibial nerve.  Pt has no pain or numbness in typical distributions of exertional compartment syndrome.

## 2011-05-22 NOTE — Progress Notes (Signed)
Subjective:    Patient ID: Sue [name], female    DOB: 08/21/1971, 40 y.o.   MRN: [id number] PCP Dr. Joneen Caraway HPI 40 yo female here for R calf spasms and bilateral foot numbness.    1. R calf spasms   Pt currently training for a marathon  She states she is up to 10-15 miles per week.    1 week ago about 1 mile into a 4 mile run, she developed spasms deep in R calf Had to stop running due to spasm.   Had no swelling or bruising and has had no pain while walking She took a day off in between runs and next time she could only run 1/2 mile before getting spasms. She has tried Ibuprofen, stretching which both help minimally No prior calf injuries or problems Does not due weight training or leg strengthening exercises.  2.  Bilateral foot numbness Has a history of this back in the fall when she saw Dr. Darrick Penna States that approximately a couple of miles into her runs, she feels bilateral feet become numb that after she gets to 6 miles, numbness resolves. Was previously diagnosed with probable tarsal tunnel syndrome She has been using Sports Insoles with medial heel wedges but not much relief. Bought new shoes recently but hasn't run enough to detect a difference  History reviewed. No pertinent past medical history.  No current outpatient prescriptions on file prior to visit.    History reviewed. No pertinent past surgical history.  No Known Allergies  History   Social History  . Marital Status: Married    Spouse Name: N/A    Number of Children: N/A  . Years of Education: N/A   Occupational History  . Not on file.   Social History Main Topics  . Smoking status: Never Smoker   . Smokeless tobacco: Not on file  . Alcohol Use: Yes     couple drinks per week  . Drug Use: Not on file  . Sexually Active: Not on file   Other Topics Concern  . Not on file   Social History Narrative  . No narrative on file    Family History  Problem Relation Age of Onset  .  Hypertension Mother   . Hyperlipidemia Father   . Hypertension Father   . Diabetes Maternal Grandfather     BP 108/72  Pulse 76  Temp(Src) 98.1 F (36.7 C) (Oral)  Ht 5\' 6"  (1.676 m)  Wt 156 lb 3.2 oz (70.852 kg)  BMI 25.21 kg/m2   Review of Systems  See HPI     Objective:   Physical Exam Gen:  NAD  R lower leg:   No gross deformity, swelling, or bruising.  No palpable cords in calf.   No focal tenderness to palpation in calf muscle medially or laterally FROM knee and ankle Negative Thompson's test Strength 5/5 with all ankle motions. Neg Tinel's over tibial nerve  L ankle  No gross deformity, swelling or bruising. No focal tenderness about ankle or foot FROM Negative Thompson's test Negative Tinel's over tibial nerve  Mild bilateral over pronation long arches  Assessment & Plan:    1.  R calf spasms/strain  - Pt shown calf strengthening exercises to do everyday. Provided with a calf sleeve for compression and warmth.  Tylenol or NSAIDS  Prn.  Icing after activities.  Will ease her back into a walk/jog program as her pain allows.  See instructions for further. 2.  Bilateral foot numbness secondary  to bilateral compressive neuropathy versus less likely tarsal tunnel syndrome - Pts. Numbness is not in typical tarsal tunnel distribution and she has neg Tinel's today.  We discussed ways to relieve circumferential pressure on her ankle that is likely contributing to her numbness.  She does have mild over-pronation of bilateral long arches.  Given sports insoles with scaphoid pads to prevent possible stretching of tibial nerve.  Pt has no pain or numbness in typical distributions of exertional compartment syndrome.

## 2011-07-03 ENCOUNTER — Encounter: Payer: Self-pay | Admitting: Family Medicine

## 2011-07-03 ENCOUNTER — Inpatient Hospital Stay (INDEPENDENT_AMBULATORY_CARE_PROVIDER_SITE_OTHER)
Admission: RE | Admit: 2011-07-03 | Discharge: 2011-07-03 | Disposition: A | Discharge status: Home / Self Care | Payer: 59 | Source: Ambulatory Visit | Attending: Family Medicine | Admitting: Family Medicine

## 2011-07-03 DIAGNOSIS — T1590XA Foreign body on external eye, part unspecified, unspecified eye, initial encounter: Secondary | ICD-10-CM | POA: Insufficient documentation

## 2011-07-10 ENCOUNTER — Encounter: Payer: Self-pay | Admitting: Family Medicine

## 2011-07-10 ENCOUNTER — Ambulatory Visit (INDEPENDENT_AMBULATORY_CARE_PROVIDER_SITE_OTHER): Payer: 59 | Admitting: Family Medicine

## 2011-07-10 DIAGNOSIS — K146 Glossodynia: Secondary | ICD-10-CM

## 2011-07-10 DIAGNOSIS — Z23 Encounter for immunization: Secondary | ICD-10-CM

## 2011-07-10 NOTE — Progress Notes (Signed)
  Subjective:    Patient ID: Sue [name], female    DOB: March 15, 1971, 40 y.o.   MRN: [id number]  HPI  Lesion on her tongue, tender. Noticed it last night. Says tender and looks orange.  No treatments. No worsening or alleviating sx.   Review of Systems     Objective:   Physical Exam  Constitutional: She is oriented to person, place, and time. She appears well-developed and well-nourished.  HENT:  Head: Normocephalic and atraumatic.  Mouth/Throat: Oropharynx is clear and moist.       Popcorn kernel wedges onto her tongue. I was able to move it with a tongue depressor and pt was ble to swallow it down.   Eyes: Pupils are equal, round, and reactive to light.  Neurological: She is alert and oriented to person, place, and time.  Skin: Skin is warm and dry.  Psychiatric: She has a normal mood and affect. Her behavior is normal.          Assessment & Plan:  Tongue Pain - secondary to popcorn kernel that was removed.  Pt feeling better.   Tdap updated today. She says she gets a flu shot at work.  She plans on getting her flu shot through work.

## 2011-08-19 LAB — CCBB MATERNAL DONOR DRAW

## 2011-08-19 LAB — CBC
HCT: 32.6 — ABNORMAL LOW
Hemoglobin: 10.9 — ABNORMAL LOW
MCHC: 33.4
MCHC: 33.6
MCV: 82.2
Platelets: 140 — ABNORMAL LOW
Platelets: 187
RDW: 14.1
RDW: 14.2

## 2011-10-02 ENCOUNTER — Other Ambulatory Visit: Payer: Self-pay | Admitting: Family Medicine

## 2011-10-26 NOTE — Progress Notes (Signed)
Summary: eye irritation (rm 4)   Vital Signs:  Patient Profile:   40 Years Old Female CC:      right eye irritation x today Height:     66 inches Weight:      154 pounds O2 Sat:      98 % O2 treatment:    Room Air Temp:     98.6 degrees F oral Pulse rate:   66 / minute Resp:     14 per minute BP sitting:   99 / 64  (left arm) Cuff size:   regular  Vitals Entered By: Lajean Saver RN (July 03, 2011 4:29 PM)                  Updated Prior Medication List: SERTRALINE HCL 50 MG TABS (SERTRALINE HCL) Take 1 tablet by mouth once a day PROAIR HFA 108 (90 BASE) MCG/ACT AERS (ALBUTEROL SULFATE) inhale 2 puffs as needed MIRENA 20 MCG/24HR IUD (LEVONORGESTREL)   Current Allergies: No known allergies History of Present Illness Chief Complaint: right eye irritation x today History of Present Illness: Twice she has irigated her eye but she keeps feeling as if something is in it sticking her as well. No Hx of eyes problems before and she does not wear corrective eyewear.  Current Problems: FOREIGN BODY, EYE, RIGHT (ICD-930.9) HIP PAIN, RIGHT (ICD-719.45) CORNEAL ABRASION, RIGHT (ICD-918.1) FOOT PAIN, BILATERAL (ICD-729.5) NUMBNESS (ICD-782.0) HAMSTRING TENDINITIS (ICD-726.90) SHIN SPLINTS (ICD-844.9) DIABETES MELLITUS, GESTATIONAL, HX OF (ICD-V13.29) ANXIETY DISORDER, GENERALIZED (ICD-300.02) ASTHMA (ICD-493.90)   Current Meds SERTRALINE HCL 50 MG TABS (SERTRALINE HCL) Take 1 tablet by mouth once a day PROAIR HFA 108 (90 BASE) MCG/ACT AERS (ALBUTEROL SULFATE) inhale 2 puffs as needed MIRENA 20 MCG/24HR IUD (LEVONORGESTREL)  GENTAMICIN SULFATE 0.3 % SOLN (GENTAMICIN SULFATE) 2 drops in R eye 3-4x a day next 3-5 days HYDROCODONE-ACETAMINOPHEN 5-325 MG TABS (HYDROCODONE-ACETAMINOPHEN) 1 by mouth q 8hrs as needed for pain  REVIEW OF SYSTEMS Constitutional Symptoms      Denies fever, chills, night sweats, weight loss, weight gain, and fatigue.  Eyes       Complains of eye  pain.      Denies change in vision, eye discharge, glasses, contact lenses, and eye surgery. Ear/Nose/Throat/Mouth       Denies hearing loss/aids, change in hearing, ear pain, ear discharge, dizziness, frequent runny nose, frequent nose bleeds, sinus problems, sore throat, hoarseness, and tooth pain or bleeding.  Respiratory       Denies dry cough, productive cough, wheezing, shortness of breath, asthma, bronchitis, and emphysema/COPD.  Cardiovascular       Denies murmurs, chest pain, and tires easily with exhertion.    Gastrointestinal       Denies stomach pain, nausea/vomiting, diarrhea, constipation, blood in bowel movements, and indigestion. Genitourniary       Denies painful urination, kidney stones, and loss of urinary control. Neurological       Denies paralysis, seizures, and fainting/blackouts. Musculoskeletal       Denies muscle pain, joint pain, joint stiffness, decreased range of motion, redness, swelling, muscle weakness, and gout.  Skin       Denies bruising, unusual mles/lumps or sores, and hair/skin or nail changes.  Psych       Denies mood changes, temper/anger issues, anxiety/stress, speech problems, depression, and sleep problems. Other Comments: Today patient felt "like something was poking me in the center of my eye". She washed her eye out and the pain went away. it came back later,  washed again and it has returned again   Past History:  Family History: Last updated: 07/30/2009 Mother alive and healthy Father alive high cholesterol, HTN otherwise healthy Brother alive and healthy Sister alive anxiety, depression  otherwise healthy Ucnle with depression.  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  [place] with 2 duaghters and one son.   Never Smoked Alcohol use-yes, 1-2 Drug use-no Regular exercise-yes, running. 3x/wk  Past Medical History: Reviewed  history from 07/30/2009 and no changes required. 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    Past Surgical History: Reviewed history from 07/30/2009 and no changes required. Moh's surgery to remove Basal Cell Carcinoma on nose  Family History: Reviewed history from 07/30/2009 and no changes required. Mother alive and healthy Father alive high cholesterol, HTN otherwise healthy Brother alive and healthy Sister alive anxiety, depression  otherwise healthy Ucnle with depression.  MGF with Diabetes PGF stroke  Social History: Reviewed history from 06/27/2010 and no changes required. 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  [place] with 2 duaghters and one son.   Never Smoked Alcohol use-yes, 1-2 Drug use-no Regular exercise-yes, running. 3x/wk Physical Exam General appearance: well developed, well nourished, no acute distress Head: normocephalic, atraumatic Eyes: conjunctivae and lids normal Pupils: equal, round, reactive to light Skin: no obvious rashes or lesions MSE: oriented to time, place, and person Assessment Problems:   HIP PAIN, RIGHT (ICD-719.45) CORNEAL ABRASION, RIGHT (ICD-918.1) FOOT PAIN, BILATERAL (ICD-729.5) NUMBNESS (ICD-782.0) HAMSTRING TENDINITIS (ICD-726.90) SHIN SPLINTS (ICD-844.9) DIABETES MELLITUS, GESTATIONAL, HX OF (ICD-V13.29) ANXIETY DISORDER, GENERALIZED (ICD-300.02) ASTHMA (ICD-493.90) New Problems: FOREIGN BODY, EYE, RIGHT (ICD-930.9)   Plan New Medications/Changes: HYDROCODONE-ACETAMINOPHEN 5-325 MG TABS (HYDROCODONE-ACETAMINOPHEN) 1 by mouth q 8hrs as needed for pain  #20 x 0, 07/03/2011, Hassan Rowan MD GENTAMICIN SULFATE 0.3 % SOLN (GENTAMICIN SULFATE) 2 drops in R eye 3-4x a day next 3-5 days  #1 x 0, 07/03/2011, Hassan Rowan MD  New Orders: Est. Patient Level III [16109] Remove Foreign Body Eye superficial  [65205] Irrigation Syringe [A4322] Follow Up: Follow up in 2-3 days if no  improvement Follow Up: Opthomology Work/School Excuse: Return to work/school tomorrow  The patient and/or caregiver has been counseled thoroughly with regard to medications prescribed including dosage, schedule, interactions, rationale for use, and possible side effects and they verbalize understanding.  Diagnoses and expected course of recovery discussed and will return if not improved as expected or if the condition worsens. Patient and/or caregiver verbalized understanding.   PROCEDURE:  Lesion Removal Site: R eye Number of Lesions: 1 Anesthesia: tetracaine Procedure: after eye was stained and no signs of scratches or FOB was  seen on the cornea the eye lid was inverted and swept w/a moist cotton swab . A small silver piece of material was recovered.  Follow up: plans to try and irrigate between 100-511ml of saline  Prescriptions: HYDROCODONE-ACETAMINOPHEN 5-325 MG TABS (HYDROCODONE-ACETAMINOPHEN) 1 by mouth q 8hrs as needed for pain  #20 x 0   Entered and Authorized by:   Hassan Rowan MD   Signed by:   Hassan Rowan MD on 07/03/2011   Method used:   Printed then faxed to ...       [facility]  [address] [phone]* (retail)       [address]       [place], [place]  [address]  Ph: [phone] or [phone]       Fax: [phone]   RxID:   [id number] GENTAMICIN SULFATE 0.3 % SOLN (GENTAMICIN SULFATE) 2 drops in R eye 3-4x a day next 3-5 days  #1 x 0   Entered and Authorized by:   Hassan Rowan MD   Signed by:   Hassan Rowan MD on 07/03/2011   Method used:   Printed then faxed to ...       [facility]  [address] [phone]* (retail)       [address] [place], [place]  [address]       Ph: [phone] or [phone]       Fax: [phone]   RxID:   [id number]   Patient Instructions: 1)  If not better by MOnday see OPthomologist of choice if symptoms get worse this weekend go to [facility].  Orders Added: 1)  Est. Patient Level III [42706] 2)  Remove Foreign Body Eye  superficial  [65205] 3)  Irrigation Syringe [A4322]

## 2011-12-26 ENCOUNTER — Encounter: Payer: Self-pay | Admitting: Obstetrics and Gynecology

## 2011-12-26 DIAGNOSIS — N83202 Unspecified ovarian cyst, left side: Secondary | ICD-10-CM

## 2011-12-26 DIAGNOSIS — N926 Irregular menstruation, unspecified: Secondary | ICD-10-CM

## 2012-01-06 ENCOUNTER — Other Ambulatory Visit: Payer: Self-pay

## 2012-01-06 ENCOUNTER — Encounter (HOSPITAL_COMMUNITY): Payer: Self-pay | Admitting: *Deleted

## 2012-01-06 ENCOUNTER — Emergency Department (INDEPENDENT_AMBULATORY_CARE_PROVIDER_SITE_OTHER)
Admission: EM | Admit: 2012-01-06 | Discharge: 2012-01-06 | Disposition: A | Discharge status: Home / Self Care | Payer: 59 | Source: Home / Self Care | Attending: Family Medicine | Admitting: Family Medicine

## 2012-01-06 DIAGNOSIS — K219 Gastro-esophageal reflux disease without esophagitis: Secondary | ICD-10-CM

## 2012-01-06 DIAGNOSIS — F410 Panic disorder [episodic paroxysmal anxiety] without agoraphobia: Secondary | ICD-10-CM

## 2012-01-06 DIAGNOSIS — R4589 Other symptoms and signs involving emotional state: Secondary | ICD-10-CM

## 2012-01-06 HISTORY — DX: Anxiety disorder, unspecified: F41.9

## 2012-01-06 MED ORDER — CLONAZEPAM 0.5 MG PO TABS: 0.5000 mg | ORAL_TABLET | Freq: Two times a day (BID) | ORAL | Status: DC | PRN

## 2012-01-06 MED ORDER — GI COCKTAIL ~~LOC~~
ORAL | Status: AC
  Filled 2012-01-06: qty 30

## 2012-01-06 MED ORDER — PANTOPRAZOLE SODIUM 40 MG PO TBEC: 40.0000 mg | DELAYED_RELEASE_TABLET | Freq: Every day | ORAL | Status: DC

## 2012-01-06 MED ORDER — GI COCKTAIL ~~LOC~~
30.0000 mL | Freq: Once | ORAL | Status: AC
  Administered 2012-01-06: 30 mL via ORAL

## 2012-01-06 NOTE — ED Notes (Signed)
Pt. States jaw pain eased.

## 2012-01-06 NOTE — ED Notes (Signed)
C/o chest tightness x 3 days but today pain to left jaw, c/o feeling lightheaded and faint and slightly nausea

## 2012-01-06 NOTE — ED Provider Notes (Signed)
History     CSN: [id number]  Arrival date & time 01/06/12  1659   First MD Initiated Contact with Patient 01/06/12 1707      Chief Complaint  Patient presents with  . Chest Pain    (Consider location/radiation/quality/duration/timing/severity/associated sxs/prior treatment) Patient is a 41 y.o. female presenting with chest pain. The history is provided by the patient.  Chest Pain The chest pain began 2 days ago. Chest pain occurs constantly. The chest pain is unchanged. The severity of the pain is mild. The quality of the pain is described as tightness. Primary symptoms include palpitations and nausea. Primary symptoms comment: has had palpitations in past assoc with panic attacks , has has assoc gerd sx for past 2 days, and been under lot of work stress. Risk factors include stress.     Past Medical History  Diagnosis Date  . Anxiety     History reviewed. No pertinent past surgical history.  Family History  Problem Relation Age of Onset  . Hypertension Mother   . Hyperlipidemia Father   . Hypertension Father   . Diabetes Maternal Grandfather     History  Substance Use Topics  . Smoking status: Never Smoker   . Smokeless tobacco: Not on file  . Alcohol Use: Yes     couple drinks per week    OB History    Grav Para Term Preterm Abortions TAB SAB Ect Mult Living                  Review of Systems  Constitutional: Negative.   HENT: Negative.   Eyes: Negative.   Respiratory: Positive for chest tightness.   Cardiovascular: Positive for chest pain and palpitations.  Gastrointestinal: Positive for nausea.  Neurological: Positive for light-headedness.    Allergies  Review of patient's allergies indicates no known allergies.  Home Medications   Current Outpatient Rx  Name Route Sig Dispense Refill  . SERTRALINE HCL 25 MG PO TABS Oral Take 25 mg by mouth daily.    Marland Kitchen CLONAZEPAM 0.5 MG PO TABS Oral Take 1 tablet (0.5 mg total) by mouth 2 (two) times daily as  needed for anxiety. 30 tablet 0  . LEVONORGESTREL 20 MCG/24HR IU IUD Intrauterine 1 each by Intrauterine route once.    Marland Kitchen PANTOPRAZOLE SODIUM 40 MG PO TBEC Oral Take 1 tablet (40 mg total) by mouth daily. 30 tablet 1  . PROPRANOLOL HCL 10 MG PO TABS  TAKE ONE TABLET BY MOUTH TWICE A DAY AS NEEDED FOR PUBLIC SPEAKING 30 tablet 1    BP 130/88  Pulse 86  Temp(Src) 99 F (37.2 C) (Oral)  Resp 22  SpO2 100%  LMP 01/06/2012  Physical Exam  Nursing note and vitals reviewed. Constitutional: She is oriented to person, place, and time. She appears well-developed and well-nourished.  HENT:  Head: Normocephalic.  Right Ear: External ear normal.  Mouth/Throat: Oropharynx is clear and moist.  Eyes: Pupils are equal, round, and reactive to light.  Neck: Normal range of motion. Neck supple.  Cardiovascular: Normal rate, regular rhythm, normal heart sounds and intact distal pulses.   Pulmonary/Chest: Effort normal and breath sounds normal.  Abdominal: Soft. Bowel sounds are normal. There is no tenderness.  Musculoskeletal: She exhibits no edema.  Neurological: She is alert and oriented to person, place, and time.  Skin: Skin is warm and dry.    ED Course  Procedures (including critical care time)  Labs Reviewed - No data to display No results found.  1. GERD (gastroesophageal reflux disease)   2. Panic attack as reaction to stress       MDM  [id number]--[id number]  Sx improved after med.        Barkley Bruns, MD 01/06/12 1740

## 2012-01-06 NOTE — ED Notes (Signed)
Pt. Placed on Monitor. NSR noted w/ rate of 90

## 2012-01-06 NOTE — Discharge Instructions (Signed)
Consider increasing zoloft to 50mg  daily, take new medicine as prescribed, reduce coffee,see your doctor to discuss medicines.

## 2012-01-22 ENCOUNTER — Ambulatory Visit (INDEPENDENT_AMBULATORY_CARE_PROVIDER_SITE_OTHER): Payer: 59 | Admitting: Family Medicine

## 2012-01-22 ENCOUNTER — Encounter: Payer: Self-pay | Admitting: Family Medicine

## 2012-01-22 VITALS — BP 110/46 | HR 72 | Ht 66.0 in | Wt 157.0 lb

## 2012-01-22 DIAGNOSIS — F419 Anxiety disorder, unspecified: Secondary | ICD-10-CM

## 2012-01-22 DIAGNOSIS — R61 Generalized hyperhidrosis: Secondary | ICD-10-CM

## 2012-01-22 DIAGNOSIS — F411 Generalized anxiety disorder: Secondary | ICD-10-CM

## 2012-01-22 MED ORDER — SERTRALINE HCL 50 MG PO TABS: 50.0000 mg | ORAL_TABLET | Freq: Every day | ORAL | Status: DC

## 2012-01-22 NOTE — Progress Notes (Addendum)
  Subjective:    Patient ID: Sue [name], female    DOB: November 05, 1971, 41 y.o.   MRN: [id number]  HPI says had been taking her medication irregularly bc she was doing well. But then 2 weeks ago had a panic attack and went to UC.  Now here to get back on meds.  Would like to start seeing a therapist before.    Did restart the zoloft. Used to go to counseling and would like to go again. Would prefer [place].   Says she sweats a lot more since having children. It is mostly under the under arm since that has tried several different types of deodorants without significant improvement. She also has a problem with underarm odor as well.  Review of Systems     Objective:   Physical Exam  Constitutional: She is oriented to person, place, and time. She appears well-developed and well-nourished.  HENT:  Head: Normocephalic and atraumatic.  Cardiovascular: Normal rate, regular rhythm and normal heart sounds.   Pulmonary/Chest: Effort normal and breath sounds normal.  Neurological: She is alert and oriented to person, place, and time.  Skin: Skin is warm and dry.  Psychiatric: She has a normal mood and affect. Her behavior is normal.          Assessment & Plan:  Anxiety - GAD -7 score of 13. Will refer for counseling. She is out of the sertraline, replace with 50 mg tablet. I would like to see her back in 4-6 weeks and repeat her dad 7 to see if she is at therapeutic goal. If she is not then consider increasing to 100 mg.  Excessive  Sweating - we discussed using a clinical strength type deodorant to have a higher amount of aluminum chloride. There also prescription medication such as Drysol that can be used. Also recommend applying at night as this will help shrink the sweat glands. Also recommend using a stain remover on her clothing close to the underarms of her clothes and using an antibacterial soap to scrub underarm.

## 2012-03-07 ENCOUNTER — Ambulatory Visit: Payer: 59 | Admitting: Family Medicine

## 2012-03-07 ENCOUNTER — Encounter: Payer: Self-pay | Admitting: *Deleted

## 2012-03-11 ENCOUNTER — Encounter: Payer: Self-pay | Admitting: *Deleted

## 2012-03-14 ENCOUNTER — Encounter: Payer: Self-pay | Admitting: Family Medicine

## 2012-03-14 ENCOUNTER — Ambulatory Visit (INDEPENDENT_AMBULATORY_CARE_PROVIDER_SITE_OTHER): Payer: 59 | Admitting: Family Medicine

## 2012-03-14 VITALS — BP 115/59 | HR 82 | Ht 66.0 in | Wt 155.0 lb

## 2012-03-14 DIAGNOSIS — F411 Generalized anxiety disorder: Secondary | ICD-10-CM

## 2012-03-14 DIAGNOSIS — F419 Anxiety disorder, unspecified: Secondary | ICD-10-CM

## 2012-03-14 MED ORDER — SERTRALINE HCL 50 MG PO TABS: 50.0000 mg | ORAL_TABLET | Freq: Every day | ORAL | Status: DC

## 2012-03-14 NOTE — Progress Notes (Signed)
  Subjective:    Patient ID: Sue [name], female    DOB: 12-13-1970, 41 y.o.   MRN: [id number]  HPI Anxiety - Says overall feeling much better.  Says doesn't want to go up on her dose bc she says it affects her libido and says tends to make it hard to lose weight. She has taken it before.  No CP or SOB.  Sleeing well.  Some regular exercise right nowl She hasn't had change to make appt with therapist yet, but plans to.     Review of Systems     Objective:   Physical Exam  Constitutional: She is oriented to person, place, and time. She appears well-developed and well-nourished.  HENT:  Head: Normocephalic and atraumatic.  Cardiovascular: Normal rate, regular rhythm and normal heart sounds.   Pulmonary/Chest: Effort normal and breath sounds normal.  Neurological: She is alert and oriented to person, place, and time.  Skin: Skin is warm and dry.  Psychiatric: She has a normal mood and affect. Her behavior is normal.          Assessment & Plan:  Anxiety - Gad7 score down to 8, form 13.  Says hasn't made appt with therapist yesterday.  Encouraged her to do so. We discussed options of increasing meds but wants to hold off for now bc of S.E. Continue with resgular exercise. F/U in 8 weeks.

## 2012-05-20 ENCOUNTER — Encounter: Payer: Self-pay | Admitting: *Deleted

## 2012-05-23 ENCOUNTER — Ambulatory Visit: Payer: 59 | Admitting: Family Medicine

## 2012-05-30 ENCOUNTER — Ambulatory Visit (INDEPENDENT_AMBULATORY_CARE_PROVIDER_SITE_OTHER): Payer: 59 | Admitting: Family Medicine

## 2012-05-30 ENCOUNTER — Encounter: Payer: Self-pay | Admitting: Family Medicine

## 2012-05-30 VITALS — BP 91/57 | HR 100 | Ht 66.0 in | Wt 157.0 lb

## 2012-05-30 DIAGNOSIS — F419 Anxiety disorder, unspecified: Secondary | ICD-10-CM

## 2012-05-30 DIAGNOSIS — F411 Generalized anxiety disorder: Secondary | ICD-10-CM

## 2012-05-30 MED ORDER — CLONAZEPAM 0.5 MG PO TABS: 0.5000 mg | ORAL_TABLET | Freq: Every day | ORAL | Status: DC | PRN

## 2012-05-30 MED ORDER — SERTRALINE HCL 50 MG PO TABS: 75.0000 mg | ORAL_TABLET | Freq: Every day | ORAL | Status: DC

## 2012-05-30 NOTE — Progress Notes (Signed)
  Subjective:    Patient ID: Sue [name], female    DOB: 03-27-1971, 41 y.o.   MRN: [id number]  HPI Anxiety - sEeing [place] at [facility] group.  Taking her meds without any SE.  Feels the overall anxiety is better but still has panicky moments when feel like on  The verge of a panic attack. Says walking with psych who rec inc her zoloft and consider adding clonazapam.  Says her appetite increases on zoloft so feaful of weight gain.     Review of Systems     Objective:   Physical Exam  Constitutional: She is oriented to person, place, and time. She appears well-developed and well-nourished.  HENT:  Head: Normocephalic and atraumatic.  Neurological: She is alert and oriented to person, place, and time.  Skin: Skin is warm.  Psychiatric: She has a normal mood and affect. Her behavior is normal.          Assessment & Plan:  Anxiety - GAD- score of 5.5, down from 8, 8 weeks ago.  We discussed different options including going up to 100 mg versus going in between and 75 mg. We also discussed changing to a different SSRI. She has taken Paxil in the past and gained a lot of weight on that as well. So far it has not affected her libido. Will increase her zoloft to 75mg .  Wil add clonazepam for rescue only. We discussed the possibility of dependency issues with this type of medication. Also warned about sedation with benzodiazepines. She said she will use it cautiously. Encouraged her start with half a tab if needed and then increase to whole after 30 minutes if she is not getting response with half of a tab. Followup in one month. We will continue to monitor weight. She continues to gain and we can consider switching her to either fluoxetine or possibly citalopram.

## 2012-06-10 ENCOUNTER — Other Ambulatory Visit: Payer: Self-pay | Admitting: Dermatology

## 2012-06-27 ENCOUNTER — Ambulatory Visit: Payer: 59 | Admitting: Family Medicine

## 2012-07-28 ENCOUNTER — Ambulatory Visit (INDEPENDENT_AMBULATORY_CARE_PROVIDER_SITE_OTHER): Payer: 59 | Admitting: Family Medicine

## 2012-07-28 ENCOUNTER — Encounter: Payer: Self-pay | Admitting: Family Medicine

## 2012-07-28 VITALS — BP 130/72 | HR 69 | Ht 66.0 in | Wt 150.0 lb

## 2012-07-28 DIAGNOSIS — M79609 Pain in unspecified limb: Secondary | ICD-10-CM

## 2012-07-28 DIAGNOSIS — M79604 Pain in right leg: Secondary | ICD-10-CM

## 2012-07-28 NOTE — Patient Instructions (Addendum)
Your history and exam are consistent with different issues in your right leg (patellofemoral syndrome, IT band syndrome, iliopsoas fatigue/weakness). These should all improve with home exercises. If you're still having problems can consider formal physical therapy. You can continue with your running protocol - no restrictions. Straight leg raises, straight leg raises with foot turned outward, hip side raises - 3 sets of 10 once a day. Hip flexion 3 sets of 10. Pick 2-3 stretches for IT band and hip flexor - hold for 20-30 seconds, repeat 3 times. Do all of these daily for next 6 weeks. Can add ankle weight if exercises become too easy. Follow up with me in 6 weeks.

## 2012-08-08 ENCOUNTER — Encounter: Payer: Self-pay | Admitting: Family Medicine

## 2012-08-08 DIAGNOSIS — M79604 Pain in right leg: Secondary | ICD-10-CM | POA: Insufficient documentation

## 2012-08-08 NOTE — Assessment & Plan Note (Signed)
Patient's exam negative for hip, lower leg stress fracture but advised to continue monitoring this - if worsening despite exercises and continued activity advised to return for f/u.  This is most consistent with right lower extremity weakness leading to pain from patellofemoral syndrome, IT band syndrome.  Shown a home exercise program and stretches.  Handouts provided.  Consider formal PT, imaging if not improving.  F/u in 6 weeks otherwise.

## 2012-08-08 NOTE — Progress Notes (Signed)
Subjective:    Patient ID: Sue [name], female    DOB: 1971-10-13, 41 y.o.   MRN: [id number]  PCP: Dr. Linford Arnold  HPI 41 yo F here for right hip, knee pain.  Patient denies known injury. States for past 2 weeks she has had a feeling of a rubber band tightness from hip to knee. Feels worse when running, worse last 2 miles. Some pain after running too. Has not progressed though to occurring earlier in run. No prior h/o stress fracture. Stretches before running and afterwards. Pain is primarily behind kneecap and up to right hip. Running 25-30 miles per week. No prior h/o stress fracture.  Past Medical History  Diagnosis Date  . Anxiety   . Asthma   . Gestational diabetes     Current Outpatient Prescriptions on File Prior to Visit  Medication Sig Dispense Refill  . albuterol (PROVENTIL HFA;VENTOLIN HFA) 108 (90 BASE) MCG/ACT inhaler Inhale 2 puffs into the lungs every 6 (six) hours as needed.      . clonazePAM (KLONOPIN) 0.5 MG tablet Take 1 tablet (0.5 mg total) by mouth daily as needed for anxiety.  30 tablet  0  . levonorgestrel (MIRENA) 20 MCG/24HR IUD 1 each by Intrauterine route once.      . propranolol (INDERAL) 10 MG tablet TAKE ONE TABLET BY MOUTH TWICE A DAY AS NEEDED FOR PUBLIC SPEAKING  30 tablet  1  . sertraline (ZOLOFT) 50 MG tablet Take 1.5 tablets (75 mg total) by mouth daily.  135 tablet  0    Past Surgical History  Procedure Date  . Mohs surgery     removal of basal cell carcinoma    No Known Allergies  History   Social History  . Marital Status: Married    Spouse Name: N/A    Number of Children: N/A  . Years of Education: N/A   Occupational History  . Not on file.   Social History Main Topics  . Smoking status: Never Smoker   . Smokeless tobacco: Not on file  . Alcohol Use: Yes     couple drinks per week  . Drug Use: No  . Sexually Active: Not on file   Other Topics Concern  . Not on file   Social History Narrative  . No  narrative on file    Family History  Problem Relation Age of Onset  . Hypertension Mother   . Hyperlipidemia Father   . Hypertension Father   . Diabetes Maternal Grandfather   . Depression Sister     BP 130/72  Pulse 69  Ht 5\' 6"  (1.676 m)  Wt 150 lb (68.04 kg)  BMI 24.21 kg/m2  Review of Systems See HPI above.    Objective:   Physical Exam Gen: NAD  R hip: No gross deformity, swelling, bruising. No focal TTP greater trochanter with mild tenderness within IT band.  No other TTP about hip. FROM with negative logroll. 4/5 strength with hip flexion and pain.  5-/5 with hip abduction. 5/5 other hip motions. NVI distally. Negative hop and fulcrum tests. Leg lengths equal.  R knee: No gross deformity, ecchymoses, swelling.  Mild VMO atrophy. No TTP joint lines or post patellar facets. FROM. Negative ant/post drawers. Negative valgus/varus testing. Negative lachmanns. Negative mcmurrays, apleys, patellar apprehension, clarkes. NV intact distally.    Assessment & Plan:  1. Right leg pain - Patient's exam negative for hip, lower leg stress fracture but advised to continue monitoring this - if worsening despite exercises  and continued activity advised to return for f/u.  This is most consistent with right lower extremity weakness leading to pain from patellofemoral syndrome, IT band syndrome.  Shown a home exercise program and stretches.  Handouts provided.  Consider formal PT, imaging if not improving.  F/u in 6 weeks otherwise.

## 2012-08-30 ENCOUNTER — Emergency Department
Admission: EM | Admit: 2012-08-30 | Discharge: 2012-08-30 | Disposition: A | Discharge status: Home / Self Care | Payer: 59 | Source: Home / Self Care | Attending: Family Medicine | Admitting: Family Medicine

## 2012-08-30 DIAGNOSIS — J069 Acute upper respiratory infection, unspecified: Secondary | ICD-10-CM

## 2012-08-30 DIAGNOSIS — H9209 Otalgia, unspecified ear: Secondary | ICD-10-CM

## 2012-08-30 MED ORDER — AMOXICILLIN 875 MG PO TABS: 875.0000 mg | ORAL_TABLET | Freq: Two times a day (BID) | ORAL | Status: DC

## 2012-08-30 MED ORDER — BENZONATATE 200 MG PO CAPS: 200.0000 mg | ORAL_CAPSULE | Freq: Every day | ORAL | Status: DC

## 2012-08-30 MED ORDER — ALBUTEROL SULFATE HFA 108 (90 BASE) MCG/ACT IN AERS: 2.0000 | INHALATION_SPRAY | RESPIRATORY_TRACT | Status: DC | PRN

## 2012-08-30 MED ORDER — PREDNISONE 20 MG PO TABS: 20.0000 mg | ORAL_TABLET | Freq: Two times a day (BID) | ORAL | Status: DC

## 2012-08-30 NOTE — ED Notes (Signed)
Angellica complains of dry cough and nasal congestion for 2 weeks. She has had chills, ear pain, sneezing, sore throat and body aches. Denies fever or sweats. She took Nyquil and Dayquil without relief.

## 2012-08-30 NOTE — ED Provider Notes (Signed)
History     CSN: [id number]  Arrival date & time 08/30/12  1706   First MD Initiated Contact with Patient 08/30/12 1726      Chief Complaint  Patient presents with  . Cough    x 2 weeks  . Nasal Congestion    x 2weeks      HPI Comments: Patient complains of approximately 2 week history of gradually progressive URI symptoms beginning with a mild sore throat (now improved), followed by progressive nasal congestion and a cough.  The symptoms became acutely worse one week ago.  She complains of increased fatigue and myalgias.  Cough is now worse at night and generally non-productive during the day.  There has been no pleuritic pain or shortness of breath but she does wheeze at times.  She has a history of exercise induced asthma, and rarely ever needs her albuterol inhaler but lately has felt that it would be helpful.   The history is provided by the patient.    Past Medical History  Diagnosis Date  . Anxiety   . Asthma   . Gestational diabetes     Past Surgical History  Procedure Date  . Mohs surgery     removal of basal cell carcinoma    Family History  Problem Relation Age of Onset  . Hypertension Mother   . Hyperlipidemia Father   . Hypertension Father   . Diabetes Maternal Grandfather   . Depression Sister     History  Substance Use Topics  . Smoking status: Never Smoker   . Smokeless tobacco: Not on file  . Alcohol Use: Yes     couple drinks per week    OB History    Grav Para Term Preterm Abortions TAB SAB Ect Mult Living                  Review of Systems + sore throat + cough No pleuritic pain + occasional wheezing + nasal congestion + post-nasal drainage + sinus pain/pressure No itchy/red eyes ? Earache; left ear feels clogged with decreased hearing No hemoptysis No SOB No fever, + chills No nausea No vomiting No abdominal pain No diarrhea No urinary symptoms No skin rashes + fatigue + myalgias No headache Used OTC meds without  relief  Allergies  Review of patient's allergies indicates no known allergies.  Home Medications   Current Outpatient Rx  Name Route Sig Dispense Refill  . LEVONORGESTREL 20 MCG/24HR IU IUD Intrauterine 1 each by Intrauterine route once.    Marland Kitchen PROPRANOLOL HCL 10 MG PO TABS  TAKE ONE TABLET BY MOUTH TWICE A DAY AS NEEDED FOR PUBLIC SPEAKING 30 tablet 1  . SERTRALINE HCL 50 MG PO TABS Oral Take 1.5 tablets (75 mg total) by mouth daily. 135 tablet 0  . ALBUTEROL SULFATE HFA 108 (90 BASE) MCG/ACT IN AERS Inhalation Inhale 2 puffs into the lungs every 6 (six) hours as needed.    . ALBUTEROL SULFATE HFA 108 (90 BASE) MCG/ACT IN AERS Inhalation Inhale 2 puffs into the lungs every 4 (four) hours as needed for wheezing. 1 Inhaler 0  . AMOXICILLIN 875 MG PO TABS Oral Take 1 tablet (875 mg total) by mouth 2 (two) times daily. 20 tablet 0  . BENZONATATE 200 MG PO CAPS Oral Take 1 capsule (200 mg total) by mouth at bedtime. Take as needed for cough 12 capsule 0  . CLONAZEPAM 0.5 MG PO TABS Oral Take 1 tablet (0.5 mg total) by mouth daily as  needed for anxiety. 30 tablet 0  . PREDNISONE 20 MG PO TABS Oral Take 1 tablet (20 mg total) by mouth 2 (two) times daily. Take with food. 10 tablet 0    BP 130/76  Pulse 69  Temp 98.2 F (36.8 C) (Oral)  Resp 16  Ht 5\' 5"  (1.651 m)  Wt 155 lb (70.308 kg)  BMI 25.79 kg/m2  SpO2 98%  LMP 08/12/2012  Physical Exam Nursing notes and Vital Signs reviewed. Appearance:  Patient appears healthy, stated age, and in no acute distress Eyes:  Pupils are equal, round, and reactive to light and accomodation.  Extraocular movement is intact.  Conjunctivae are not inflamed  Ears:  Canals normal (right canal partly occluded with cerumen).  Right tympanic membrane not completely visualized but appears normal.  Left tympanic membrane retracted.  Nose:  Mildly congested turbinates.  Mild maxillary sinus tenderness is present.  Pharynx:  Normal Neck:  Supple.   Tender shotty  posterior nodes are palpated bilaterally  Lungs:  Clear to auscultation.  Breath sounds are equal.  Heart:  Regular rate and rhythm without murmurs, rubs, or gallops.  Abdomen:  Nontender without masses or hepatosplenomegaly.  Bowel sounds are present.  No CVA or flank tenderness.  Extremities:  No edema.  No calf tenderness Skin:  No rash present.   ED Course  Procedures  none  Labs Reviewed - Tympanogram normal both ears    1. Acute upper respiratory infections of unspecified site; ?early sinusitis       MDM   Begin amoxicillin and prednisone burst. Prescription written for Benzonatate (Tessalon) to take at bedtime for night-time cough.  Take Mucinex D (guaifenesin with decongestant) twice daily for congestion.  Increase fluid intake, rest. May use Afrin nasal spray (or generic oxymetazoline) twice daily for about 5 days.  Also recommend using saline nasal spray several times daily and saline nasal irrigation (AYR is a common brand) Stop all antihistamines for now, and other non-prescription cough/cold preparations. Continue albuterol inhaler as needed (new Rx written) Follow-up with family doctor if not improving 7 to 10 days.         Lattie Haw, MD 08/30/12 [phone]

## 2012-09-01 ENCOUNTER — Telehealth: Payer: Self-pay | Admitting: *Deleted

## 2012-09-12 ENCOUNTER — Ambulatory Visit (INDEPENDENT_AMBULATORY_CARE_PROVIDER_SITE_OTHER): Payer: 59 | Admitting: Family Medicine

## 2012-09-12 ENCOUNTER — Encounter: Payer: Self-pay | Admitting: Family Medicine

## 2012-09-12 VITALS — BP 113/71 | HR 73 | Ht 66.0 in | Wt 148.0 lb

## 2012-09-12 DIAGNOSIS — M25579 Pain in unspecified ankle and joints of unspecified foot: Secondary | ICD-10-CM

## 2012-09-12 DIAGNOSIS — M25571 Pain in right ankle and joints of right foot: Secondary | ICD-10-CM

## 2012-09-12 NOTE — Patient Instructions (Addendum)
Have left peroneal tendinopathy. Continue using inserts in your running shoes. Foot outturning exercise (trash can or theraband) - 3 sets of 10 once a day;  And calf raise exercises 3 sets of 10 also. Icing 3 times a day for 15 minutes at a time. Aleve 2 tabs twice a day with food for pain and inflammation. Consider cortisone shot into the area though these are about 50/50 for benefiting your problem. Ankle brace may help with stability. Stop running from now to race day - it's better to go in undertrained and as healthy as possible than to complete training and run injured.

## 2012-09-13 ENCOUNTER — Encounter: Payer: Self-pay | Admitting: Family Medicine

## 2012-09-13 DIAGNOSIS — M25571 Pain in right ankle and joints of right foot: Secondary | ICD-10-CM | POA: Insufficient documentation

## 2012-09-13 NOTE — Assessment & Plan Note (Signed)
2/2 peroneal tendinopathy.  Has orthotics already.  Shown home exercise program with ext rotation, calf raises.  Advised to cease training now to allow for her to be as healthy as possible for race on Sunday - has done a significant amount of training up to this point including a 22 mile run so she should be fine for race day.  We discussed tendon sheath injection but we're 6 days from race and I think it would be about a 50/50 chance it would help as opposed to making her more sore - as such I advised against this.  Icing, regular nsaids.  See instructions.

## 2012-09-13 NOTE — Progress Notes (Signed)
Subjective:    Patient ID: Sue [name], female    DOB: 1970-11-24, 41 y.o.   MRN: [id number]  PCP: Dr. Linford Arnold  Foot Pain   41 yo F here for left foot/ankle pain.  Patient denies known injury. Patient training for a marathon coming up next weekend. States ran 22 miles about 2 weeks ago - did fine with this but that night started to develop lateral left foot/ankle pain. Took a cocktail of medicines for a URI including prednisone which helped ease the pain. Then ran a 10 mile run a few days ago and had lateral ankle/foot pain throughout and arch pain last 3 miles. No swelling or bruising. No numbness or tingling. No prior left foot/ankle problems.  Past Medical History  Diagnosis Date  . Anxiety   . Asthma   . Gestational diabetes     Current Outpatient Prescriptions on File Prior to Visit  Medication Sig Dispense Refill  . clonazePAM (KLONOPIN) 0.5 MG tablet Take 1 tablet (0.5 mg total) by mouth daily as needed for anxiety.  30 tablet  0  . levonorgestrel (MIRENA) 20 MCG/24HR IUD 1 each by Intrauterine route once.      . propranolol (INDERAL) 10 MG tablet TAKE ONE TABLET BY MOUTH TWICE A DAY AS NEEDED FOR PUBLIC SPEAKING  30 tablet  1  . sertraline (ZOLOFT) 50 MG tablet Take 1.5 tablets (75 mg total) by mouth daily.  135 tablet  0    Past Surgical History  Procedure Date  . Mohs surgery     removal of basal cell carcinoma    No Known Allergies  History   Social History  . Marital Status: Married    Spouse Name: N/A    Number of Children: N/A  . Years of Education: N/A   Occupational History  . Not on file.   Social History Main Topics  . Smoking status: Never Smoker   . Smokeless tobacco: Not on file  . Alcohol Use: Yes     couple drinks per week  . Drug Use: No  . Sexually Active: Not on file   Other Topics Concern  . Not on file   Social History Narrative  . No narrative on file    Family History  Problem Relation Age of Onset  .  Hypertension Mother   . Hyperlipidemia Father   . Hypertension Father   . Diabetes Maternal Grandfather   . Depression Sister     BP 113/71  Pulse 73  Ht 5\' 6"  (1.676 m)  Wt 148 lb (67.132 kg)  BMI 23.89 kg/m2  LMP 08/12/2012  Review of Systems  See HPI above.    Objective:   Physical Exam  Gen: NAD  L foot/ankle: No gross deformity, swelling, ecchymoses FROM with pain on ext rotation 5-/5 strength.  5/5 without pain other directions. TTP along peroneal tendons laterally.  No bony or other foot/ankle TTP. Negative ant drawer and talar tilt.   Negative syndesmotic compression. Thompsons test negative. NV intact distally.    Assessment & Plan:  1. Right foot/ankle pain - 2/2 peroneal tendinopathy.  Has orthotics already.  Shown home exercise program with ext rotation, calf raises.  Advised to cease training now to allow for her to be as healthy as possible for race on Sunday - has done a significant amount of training up to this point including a 22 mile run so she should be fine for race day.  We discussed tendon sheath injection but we're 6  days from race and I think it would be about a 50/50 chance it would help as opposed to making her more sore - as such I advised against this.  Icing, regular nsaids.  See instructions.

## 2012-09-14 ENCOUNTER — Telehealth: Payer: Self-pay | Admitting: *Deleted

## 2013-01-05 ENCOUNTER — Ambulatory Visit: Payer: 59 | Admitting: Family Medicine

## 2013-01-12 ENCOUNTER — Encounter: Payer: Self-pay | Admitting: Family Medicine

## 2013-01-12 ENCOUNTER — Ambulatory Visit (INDEPENDENT_AMBULATORY_CARE_PROVIDER_SITE_OTHER): Payer: 59 | Admitting: Family Medicine

## 2013-01-12 VITALS — BP 87/47 | HR 73 | Ht 66.0 in | Wt 155.0 lb

## 2013-01-12 MED ORDER — PROPRANOLOL HCL 10 MG PO TABS: ORAL_TABLET | ORAL | Status: DC

## 2013-01-12 MED ORDER — SERTRALINE HCL 50 MG PO TABS: 75.0000 mg | ORAL_TABLET | Freq: Every day | ORAL | Status: DC

## 2013-01-12 NOTE — Progress Notes (Signed)
  Subjective:    Patient ID: Sue [name], female    DOB: January 05, 1971, 42 y.o.   MRN: [id number]  HPI Anxiety - Doing well overall. Still some fluctuations in mood.  She is on 75mg  of sertraline.  No SE of the medication. Sleeping well.  Ran a marathon in October.  Not exercising as much.  Using MyFitness Pal for the last week.   Review of Systems     Objective:   Physical Exam  Constitutional: She is oriented to person, place, and time. She appears well-developed and well-nourished.  HENT:  Head: Normocephalic and atraumatic.  Cardiovascular: Normal rate, regular rhythm and normal heart sounds.   Pulmonary/Chest: Effort normal and breath sounds normal.  Neurological: She is alert and oriented to person, place, and time.  Skin: Skin is warm and dry.  Psychiatric: She has a normal mood and affect. Her behavior is normal.          Assessment & Plan:  GAD- GAD- 7 score of 5.  Doing well. Discussed options. She wants to continue current dose. Call if any problems or bumps in the road. Followup in 6 months. Refills sent to pharmacy.  Weight gain-we discussed strategies to work on losing weight. She started working out again and also started my fitness panel which is a phone at. This is fantastic. Also discussed how he can very easily undereat and potentially overeat and this can affect your ability to lose weight.  Tim spent 20 min, >50% spent in counseling on diet and anxiety

## 2013-04-25 ENCOUNTER — Ambulatory Visit: Payer: 59 | Admitting: *Deleted

## 2013-06-07 ENCOUNTER — Ambulatory Visit: Payer: 59 | Admitting: *Deleted

## 2013-06-19 ENCOUNTER — Encounter: Payer: 59 | Attending: Family Medicine | Admitting: Dietician

## 2013-06-19 ENCOUNTER — Encounter: Payer: Self-pay | Admitting: Dietician

## 2013-06-19 VITALS — Ht 66.0 in | Wt 156.3 lb

## 2013-06-19 DIAGNOSIS — Z713 Dietary counseling and surveillance: Secondary | ICD-10-CM | POA: Insufficient documentation

## 2013-06-19 DIAGNOSIS — E663 Overweight: Secondary | ICD-10-CM | POA: Insufficient documentation

## 2013-06-19 NOTE — Progress Notes (Signed)
  Medical Nutrition Therapy:  Appt start time: 1500 end time:  1600.   Assessment:  Primary concerns today: Daja is interested in losing weight, especially around the middle. She has previously been diagnosed with gestational diabetes for one pregnancy and is interested in preventing future diabetes. States that she was a normal weight most of her life, but gained weight when she went on Paxil for anxiety 14-15 years ago. Her weight fluctuated with three pregnancies with a highest non-pregnancy weight of 162 lbs, and has been stable around 150-155 ever since. Her weight loss goals is 135-140 lbs.   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 Husband chooses food for evening meal most of the time since he gets home from work earlier than her. Always looking for easier ways to make meals. The family eats dinner at the table with the TV on most of the time.    Tries to exercise, will exercise a lot or not at all and struggles with motivation to continue regular physical activity. Rahaf ran a marathon last fall and stopped working out after completing it. Discussed that she is barely overweight and that it is probably best to focus on eating intuitively and finding regular physical activity to do instead of counting calories at this point.   Tanita Body Composition:  Total body weight: 157 lbs Fat mass: 55 lbs FFM: 102 lbs TBW: 74.5 lbs Fat %: 35.0  MEDICATIONS: see list    DIETARY INTAKE:  24-hr recall:  B ( AM): coffee with creamer with granola and sometimes a banana  Snk ( AM): sometimes has a yogurt  L ( PM): goes to [facility] and will have salad bar with egg, chicken breast, tomatoes, broccoli, cheese, black olives, tries to not use dressing, sometimes ranch or balsamic, soups Snk ( PM): none D ( PM): meat such as fish, pork chop, with starchy side, green vegetable  (1 or 2), spaghetti once every week, goes out to eat on Friday and sometimes Saturdays Snk ( PM): cookies, string cheese  Beverages: half sweet iced tea (32 ounce), water  Usual physical activity: none at this time  Estimated energy needs: 1800 calories 200 g carbohydrates 135 g protein 50 g fat  Progress Towards Goal(s):  In progress.   Nutritional Diagnosis:  NB-1.1 Food and nutrition-related knowledge deficit As related to eating large portions at dinner and lack of regular physcial exercise.  As evidenced by BMI of 25.3.    Intervention:  Nutrition Counseling provided. Discussed focusing of intuitive eating instead of numbers on a scale, finding physical activity to do as a family, and eating more in the morning to prevent late day hunger. Plan includes: Eat in proportion with MyPlate. Listen to hunger/fullness cues. Eat when hungry and stop when no longer hungry.  Eat larger breakfast with protein and fiber to help control late day hunger. Consider going on evening walks with family or another activity with family. Consider reading Intuitive Eating book for guidance.  Handouts given during visit include:  MyPlate handout   Snack handout  Tanita scale print out  Monitoring/Evaluation:  Dietary intake, exercise, mindful eating, and body weight in 4 week(s).

## 2013-06-19 NOTE — Patient Instructions (Addendum)
Eat in proportion with MyPlate. Listen to hunger/fullness cues. Eat when hungry and stop when no longer hungry.  Eat larger breakfast with protein and fiber to help control late day hunger. Consider going on evening walks with family or another activity with family. Consider reading Intuitive Eating book for guidance.

## 2013-07-12 ENCOUNTER — Encounter: Payer: Self-pay | Admitting: Family Medicine

## 2013-07-12 ENCOUNTER — Ambulatory Visit (INDEPENDENT_AMBULATORY_CARE_PROVIDER_SITE_OTHER): Payer: 59 | Admitting: Family Medicine

## 2013-07-12 VITALS — BP 101/54 | HR 78 | Ht 65.6 in | Wt 159.0 lb

## 2013-07-12 DIAGNOSIS — F411 Generalized anxiety disorder: Secondary | ICD-10-CM

## 2013-07-12 MED ORDER — SERTRALINE HCL 100 MG PO TABS: 100.0000 mg | ORAL_TABLET | Freq: Every day | ORAL | Status: DC

## 2013-07-12 NOTE — Progress Notes (Signed)
  Subjective:    Patient ID: Sue [name], female    DOB: [birth year], 42 y.o.   MRN: [id number]  HPI Anxiety - she has been really stressed. She is moving. Says she hasn't been exercising or eating well.  Sleeping well overall. Taking meds regularly.  Her with her daughter today.  Still feelig on edge, nervous and anxious.  No loss of appetite.     Review of Systems     Objective:   Physical Exam  Constitutional: She is oriented to person, place, and time. She appears well-developed and well-nourished.  HENT:  Head: Normocephalic and atraumatic.  Cardiovascular: Normal rate, regular rhythm and normal heart sounds.   Pulmonary/Chest: Effort normal and breath sounds normal.  Neurological: She is alert and oriented to person, place, and time.  Skin: Skin is warm and dry.  Psychiatric: She has a normal mood and affect. Her behavior is normal.          Assessment & Plan:  Anxiety - will inc zoloft to 100mg  for a few months and then if doing well and settled in new home can dec dose back down.  F/U in 6 months. Cal if any concners.    Due for lipids next time I see her and A1C with her hx of gestational DM.

## 2013-07-17 ENCOUNTER — Ambulatory Visit: Payer: 59 | Admitting: Dietician

## 2013-10-16 ENCOUNTER — Ambulatory Visit (INDEPENDENT_AMBULATORY_CARE_PROVIDER_SITE_OTHER): Payer: 59 | Admitting: Family Medicine

## 2013-10-16 ENCOUNTER — Encounter: Payer: Self-pay | Admitting: Family Medicine

## 2013-10-16 ENCOUNTER — Encounter: Payer: 59 | Admitting: Family Medicine

## 2013-10-16 VITALS — BP 98/62 | HR 65 | Ht 65.6 in | Wt 163.0 lb

## 2013-10-16 DIAGNOSIS — Z1231 Encounter for screening mammogram for malignant neoplasm of breast: Secondary | ICD-10-CM

## 2013-10-16 DIAGNOSIS — Z0000 Encounter for general adult medical examination without abnormal findings: Secondary | ICD-10-CM

## 2013-10-16 MED ORDER — SERTRALINE HCL 100 MG PO TABS: 100.0000 mg | ORAL_TABLET | Freq: Every day | ORAL | Status: DC

## 2013-10-16 NOTE — Progress Notes (Signed)
  Subjective:     Sue [name] is a 42 y.o. female and is here for a comprehensive physical exam. The patient reports no problems.  History   Social History  . Marital Status: Married    Spouse Name: N/A    Number of Children: N/A  . Years of Education: N/A   Occupational History  . Not on file.   Social History Main Topics  . Smoking status: Never Smoker   . Smokeless tobacco: Not on file  . Alcohol Use: Yes     Comment: couple drinks per week  . Drug Use: No  . Sexual Activity: Not on file   Other Topics Concern  . Not on file   Social History Narrative  . No narrative on file   Health Maintenance  Topic Date Due  . Influenza Vaccine  06/23/2014  . Pap Smear  05/12/2016  . Tetanus/tdap  07/09/2021    The following portions of the patient's history were reviewed and updated as appropriate: allergies, current medications, past family history, past medical history, past social history, past surgical history and problem list.  Review of Systems A comprehensive review of systems was negative.   Objective:    BP 98/62  Pulse 65  Ht 5' 5.6" (1.666 m)  Wt 163 lb (73.936 kg)  BMI 26.64 kg/m2 General appearance: alert, cooperative and appears stated age Head: Normocephalic, without obvious abnormality, atraumatic Eyes: conj clear, EOMi, PEERLA Ears: normal TM's and external ear canals both ears Nose: Nares normal. Septum midline. Mucosa normal. No drainage or sinus tenderness. Throat: lips, mucosa, and tongue normal; teeth and gums normal Neck: no adenopathy, no carotid bruit, no JVD, supple, symmetrical, trachea midline and thyroid not enlarged, symmetric, no tenderness/mass/nodules Back: symmetric, no curvature. ROM normal. No CVA tenderness. Lungs: clear to auscultation bilaterally Heart: regular rate and rhythm, S1, S2 normal, no murmur, click, rub or gallop Abdomen: soft, non-tender; bowel sounds normal; no masses,  no organomegaly Extremities: extremities  normal, atraumatic, no cyanosis or edema Pulses: 2+ and symmetric Skin: Skin color, texture, turgor normal. No rashes or lesions Lymph nodes: normal cervical and supraclavicular nodes Neurologic: Alert and oriented X 3, normal strength and tone. Normal symmetric reflexes. Normal coordination and gait    Assessment:    Healthy female exam.    Plan:     See After Visit Summary for Counseling Recommendations  complete physical examination Keep up a regular exercise program and make sure you are eating a healthy diet Try to eat 4 servings of dairy a day, or if you are lactose intolerant take a calcium with vitamin D daily.  Your vaccines are up to date.   Mood - well controlled on zoloft - f/U in 6 mo.

## 2013-10-16 NOTE — Patient Instructions (Signed)
Check your insurance for coverage on pneumonia vaccine.

## 2013-11-22 LAB — LIPID PANEL
Total CHOL/HDL Ratio: 2.5 Ratio
VLDL: 9 mg/dL (ref 0–40)

## 2013-11-22 LAB — COMPLETE METABOLIC PANEL WITH GFR
AST: 16 U/L (ref 0–37)
Albumin: 4 g/dL (ref 3.5–5.2)
Alkaline Phosphatase: 53 U/L (ref 39–117)
Chloride: 106 mEq/L (ref 96–112)
GFR, Est Non African American: >89 mL/min
Glucose, Bld: 88 mg/dL (ref 70–99)
Potassium: 3.8 mEq/L (ref 3.5–5.3)
Sodium: 138 mEq/L (ref 135–145)
Total Protein: 6.4 g/dL (ref 6.0–8.3)

## 2014-01-12 ENCOUNTER — Ambulatory Visit: Payer: 59 | Admitting: Family Medicine

## 2014-04-17 ENCOUNTER — Encounter: Payer: Self-pay | Admitting: Family Medicine

## 2014-04-17 ENCOUNTER — Ambulatory Visit (INDEPENDENT_AMBULATORY_CARE_PROVIDER_SITE_OTHER): Payer: 59 | Admitting: Family Medicine

## 2014-04-17 VITALS — BP 115/59 | HR 77

## 2014-04-17 DIAGNOSIS — F411 Generalized anxiety disorder: Secondary | ICD-10-CM

## 2014-04-17 MED ORDER — PROPRANOLOL HCL 10 MG PO TABS: ORAL_TABLET | ORAL | Status: DC

## 2014-04-17 MED ORDER — SERTRALINE HCL 100 MG PO TABS: 100.0000 mg | ORAL_TABLET | Freq: Every day | ORAL | Status: DC

## 2014-04-17 NOTE — Progress Notes (Signed)
   Subjective:    Patient ID: Sue [name], female    DOB: [birth year], 43 y.o.   MRN: [id number]  HPI GAD f/u. She complains of feeling nervous anxious and on edge nearly every day. Also feels like she worries too much about things and has trouble relaxing. She becomes easily annoyed and feels afraid something awful might have been more than half the days of the week. She feels like she has a lot of external stressors going on right now with 3 children. She feels like a lot of the things will get better in the near future. She really is very happy with her regimen and doesn't want to change it. She would like to get back in with a therapist. She's going to see 2 different therapists and says that it was okay but felt like he really wasn't very helpful but is interested in potentially working with another therapist.  Review of Systems     Objective:   Physical Exam  Constitutional: She is oriented to person, place, and time. She appears well-developed and well-nourished.  HENT:  Head: Normocephalic and atraumatic.  Cardiovascular: Normal rate, regular rhythm and normal heart sounds.   Pulmonary/Chest: Effort normal and breath sounds normal.  Neurological: She is alert and oriented to person, place, and time.  Skin: Skin is warm and dry.  Psychiatric: She has a normal mood and affect. Her behavior is normal.          Assessment & Plan:  GAD- GAD- 7 score of 11 today. Not well controlled. Will continue current regimen but if her symptoms do not improve then please let me know and we can consider adjusting her regimen. I would also like to get her in with a provider who does more behavioral-type therapy maybe even a psychologist would be helpful. We'll try to do some research and find out for her.

## 2014-04-25 ENCOUNTER — Other Ambulatory Visit: Payer: Self-pay | Admitting: Family Medicine

## 2014-04-25 DIAGNOSIS — F411 Generalized anxiety disorder: Secondary | ICD-10-CM

## 2014-05-09 ENCOUNTER — Ambulatory Visit: Payer: 59

## 2014-06-14 ENCOUNTER — Ambulatory Visit
Admission: RE | Admit: 2014-06-14 | Discharge: 2014-06-14 | Disposition: A | Discharge status: Home / Self Care | Payer: 59 | Source: Ambulatory Visit | Attending: Family Medicine | Admitting: Family Medicine

## 2014-06-14 DIAGNOSIS — Z1231 Encounter for screening mammogram for malignant neoplasm of breast: Secondary | ICD-10-CM

## 2014-08-01 ENCOUNTER — Other Ambulatory Visit: Payer: Self-pay | Admitting: Family Medicine

## 2014-09-07 ENCOUNTER — Other Ambulatory Visit: Payer: Self-pay

## 2014-09-26 ENCOUNTER — Ambulatory Visit (INDEPENDENT_AMBULATORY_CARE_PROVIDER_SITE_OTHER): Payer: 59 | Admitting: Licensed Clinical Social Worker

## 2014-09-26 DIAGNOSIS — F4322 Adjustment disorder with anxiety: Secondary | ICD-10-CM

## 2014-10-12 ENCOUNTER — Ambulatory Visit (INDEPENDENT_AMBULATORY_CARE_PROVIDER_SITE_OTHER): Payer: 59 | Admitting: Licensed Clinical Social Worker

## 2014-10-12 DIAGNOSIS — F4322 Adjustment disorder with anxiety: Secondary | ICD-10-CM

## 2014-10-22 ENCOUNTER — Ambulatory Visit (INDEPENDENT_AMBULATORY_CARE_PROVIDER_SITE_OTHER): Payer: 59 | Admitting: Family Medicine

## 2014-10-22 ENCOUNTER — Encounter: Payer: Self-pay | Admitting: Family Medicine

## 2014-10-22 VITALS — BP 98/56 | HR 77 | Wt 172.0 lb

## 2014-10-22 DIAGNOSIS — J4530 Mild persistent asthma, uncomplicated: Secondary | ICD-10-CM

## 2014-10-22 DIAGNOSIS — F411 Generalized anxiety disorder: Secondary | ICD-10-CM

## 2014-10-22 MED ORDER — ALBUTEROL SULFATE HFA 108 (90 BASE) MCG/ACT IN AERS: 2.0000 | INHALATION_SPRAY | RESPIRATORY_TRACT | Status: DC | PRN

## 2014-10-22 MED ORDER — PROPRANOLOL HCL 10 MG PO TABS: ORAL_TABLET | ORAL | Status: DC

## 2014-10-22 MED ORDER — SERTRALINE HCL 50 MG PO TABS: 50.0000 mg | ORAL_TABLET | Freq: Every day | ORAL | Status: DC

## 2014-10-22 NOTE — Progress Notes (Signed)
   Subjective:    Patient ID: Sue [name], female    DOB: 03-15-1971, 43 y.o.   MRN: [id number]  HPI F/U anxiety - she is now seeing a therapist.  She is on 100 mg of sertraline. She is interested in decreasing her dose.  She feels it causes some weight gain. Does complain feeling nervous on it several days of the week and trouble relaxing. Does report becoming easily annoyed and irritable several days of the week.  Asthma - has had to use her rescue inhaler unless has a cold. Her current cone has expired. She needs new refill.   Review of Systems     Objective:   Physical Exam  Constitutional: She is oriented to person, place, and time. She appears well-developed and well-nourished.  HENT:  Head: Normocephalic and atraumatic.  Cardiovascular: Normal rate, regular rhythm and normal heart sounds.   Pulmonary/Chest: Effort normal and breath sounds normal.  Neurological: She is alert and oriented to person, place, and time.  Skin: Skin is warm and dry.  Psychiatric: She has a normal mood and affect. Her behavior is normal.          Assessment & Plan:  GAD- doing really well.  PHQ -9 score of 4 today. Looks great. She really wants to decrease her dose. Decrease to 74m. F/U in 6 months.    Asthma, mild intermittant- well controlled. REfill prn albuterol inhaler.

## 2014-12-27 ENCOUNTER — Emergency Department
Admission: EM | Admit: 2014-12-27 | Discharge: 2014-12-27 | Disposition: A | Discharge status: Home / Self Care | Payer: 59 | Source: Home / Self Care | Attending: Family Medicine | Admitting: Family Medicine

## 2014-12-27 ENCOUNTER — Encounter: Payer: Self-pay | Admitting: Emergency Medicine

## 2014-12-27 DIAGNOSIS — R002 Palpitations: Secondary | ICD-10-CM

## 2014-12-27 DIAGNOSIS — F411 Generalized anxiety disorder: Secondary | ICD-10-CM

## 2014-12-27 MED ORDER — CLONAZEPAM 0.25 MG PO TBDP: ORAL_TABLET | ORAL | Status: DC

## 2014-12-27 NOTE — Discharge Instructions (Signed)
Increase your Zoloft 35m to two tablets daily each morning.   Panic Attacks Panic attacks are sudden, short-livedsurges of severe anxiety, fear, or discomfort. They may occur for no reason when you are relaxed, when you are anxious, or when you are sleeping. Panic attacks may occur for a number of reasons:   Healthy people occasionally have panic attacks in extreme, life-threatening situations, such as war or natural disasters. Normal anxiety is a protective mechanism of the body that helps u[place] to danger (fight or flight response).  Panic attacks are often seen with anxiety disorders, such as panic disorder, social anxiety disorder, generalized anxiety disorder, and phobias. Anxiety disorders cause excessive or uncontrollable anxiety. They may interfere with your relationships or other life activities.  Panic attacks are sometimes seen with other mental illnesses, such as depression and posttraumatic stress disorder.  Certain medical conditions, prescription medicines, and drugs of abuse can cause panic attacks. SYMPTOMS  Panic attacks start suddenly, peak within 20 minutes, and are accompanied by four or more of the following symptoms:  Pounding heart or fast heart rate (palpitations).  Sweating.  Trembling or shaking.  Shortness of breath or feeling smothered.  Feeling choked.  Chest pain or discomfort.  Nausea or strange feeling in your stomach.  Dizziness, light-headedness, or feeling like you will faint.  Chills or hot flushes.  Numbness or tingling in your lips or hands and feet.  Feeling that things are not real or feeling that you are not yourself.  Fear of losing control or going crazy.  Fear of dying. Some of these symptoms can mimic serious medical conditions. For example, you may think you are having a heart attack. Although panic attacks can be very scary, they are not life threatening. DIAGNOSIS  Panic attacks are diagnosed through an assessment by your  health care provider. Your health care provider will ask questions about your symptoms, such as where and when they occurred. Your health care provider will also ask about your medical history and use of alcohol and drugs, including prescription medicines. Your health care provider may order blood tests or other studies to rule out a serious medical condition. Your health care provider may refer you to a mental health professional for further evaluation. TREATMENT   Most healthy people who have one or two panic attacks in an extreme, life-threatening situation will not require treatment.  The treatment for panic attacks associated with anxiety disorders or other mental illness typically involves counseling with a mental health professional, medicine, or a combination of both. Your health care provider will help determine what treatment is best for you.  Panic attacks due to physical illness usually go away with treatment of the illness. If prescription medicine is causing panic attacks, talk with your health care provider about stopping the medicine, decreasing the dose, or substituting another medicine.  Panic attacks due to alcohol or drug abuse go away with abstinence. Some adults need professional help in order to stop drinking or using drugs. HOME CARE INSTRUCTIONS   Take all medicines as directed by your health care provider.   Schedule and attend follow-up visits as directed by your health care provider. It is important to keep all your appointments. SEEK MEDICAL CARE IF:  You are not able to take your medicines as prescribed.  Your symptoms do not improve or get worse. SEEK IMMEDIATE MEDICAL CARE IF:   You experience panic attack symptoms that are different than your usual symptoms.  You have serious thoughts about hurting  yourself or others.  You are taking medicine for panic attacks and have a serious side effect. MAKE SURE YOU:  Understand these instructions.  Will watch  your condition.  Will get help right away if you are not doing well or get worse. Document Released: 11/09/2005 Document Revised: 11/14/2013 Document Reviewed: 06/23/2013 [facility] Patient Information 2015 [place], [place]. This information is not intended to replace advice given to you by your health care provider. Make sure you discuss any questions you have with your health care provider.

## 2014-12-27 NOTE — ED Notes (Addendum)
Pt is tearful...states her heart has been racing and pounding off and on x2 weeks. She think this is anxiety. She has had these sxs in past. Denies chest pain, SOB and nausea.

## 2014-12-27 NOTE — ED Provider Notes (Addendum)
CSN: [id number]     Arrival date & time 12/27/14  1705 History   First MD Initiated Contact with Patient 12/27/14 1714     Chief Complaint  Patient presents with  . Anxiety      HPI Comments: Patient has a history of anxiety.  She reports that she has been under increased stress for the prior two months.  Two weeks ago she began developing intermittent palpitations about once daily, lasting from several minutes to about 30 minutes and resolving spontaneously.  She denies chest pain, shortness of breath, and nausea.  She states that she does not feel depressed.  She reports difficulty falling asleep but not early morning awakening. She reports that in the past her anxiety had been controlled with Zoloft 134m daily, but she decreased the dose to 557mdaily after consistently well.   She states that she has a follow-up appointment with her PCP in four days.  Patient is a [age].o. female presenting with anxiety. The history is provided by the patient.  Anxiety This is a recurrent problem. Episode onset: 2 weeks ago. The problem occurs daily. The problem has been gradually worsening. Associated symptoms include headaches. Pertinent negatives include no chest pain and no shortness of breath. The symptoms are aggravated by stress. Nothing relieves the symptoms.    Past Medical History  Diagnosis Date  . Anxiety   . Asthma   . Gestational diabetes   . Basal cell carcinoma of skin of face 2007    Dr. GoDelman Cheadle Past Surgical History  Procedure Laterality Date  . Mohs surgery      removal of basal cell carcinoma   Family History  Problem Relation Age of Onset  . Hypertension Mother   . Hyperlipidemia Father   . Hypertension Father   . Diabetes Maternal Grandfather   . Depression Sister    History  Substance Use Topics  . Smoking status: Never Smoker   . Smokeless tobacco: Not on file  . Alcohol Use: Yes     Comment: couple drinks per week   OB History    No data available     Review  of Systems  Respiratory: Negative for shortness of breath.   Cardiovascular: Positive for palpitations. Negative for chest pain.  Neurological: Positive for headaches.  Psychiatric/Behavioral: Positive for sleep disturbance and decreased concentration. Negative for suicidal ideas. The patient is nervous/anxious.   All other systems reviewed and are negative.   Allergies  Review of patient's allergies indicates no known allergies.  Home Medications   Prior to Admission medications   Medication Sig Start Date End Date Taking? Authorizing Provider  albuterol (PROVENTIL HFA;VENTOLIN HFA) 108 (90 BASE) MCG/ACT inhaler Inhale 2 puffs into the lungs every 4 (four) hours as needed for wheezing. 10/22/14 10/22/15  CaHali MarryMD  clonazePAM (KLONOPIN) 0.25 MG disintegrating tablet Take one tab by mouth one to three times daily as needed for anxiety 12/27/14   StKandra NicolasMD  levonorgestrel (MIRENA) 20 MCG/24HR IUD 1 each by Intrauterine route once. 11/14/09 11/14/14  ElEarnstine RegalPA-C  propranolol (INDERAL) 10 MG tablet TAKE ONE TABLET BY MOUTH TWICE A DAY AS NEEDED FOR PUBLIC SPEAKING 1167/59/16 CaHali MarryMD  sertraline (ZOLOFT) 50 MG tablet Take 1 tablet (50 mg total) by mouth daily. 10/22/14   CaHali MarryMD   BP 134/83 mmHg  Pulse 81  Temp(Src) 98.3 F (36.8 C) (Oral)  Wt 165 lb (74.844 kg)  SpO2 97%  LMP 12/27/2014 Physical Exam Nursing notes and Vital Signs reviewed. Appearance:  Patient appears healthy, stated age, and in no acute distress.    Psychiatric:  Patient is alert and oriented with good eye contact.  Thoughts are organized.  No psychomotor retardation.  Memory intact.  Not suicidal.  Affect is flat.  Mood is mildly depressed; she becomes tearful during interview.  No suicidal ideation.  Eyes:  Pupils are equal, round, and reactive to light and accomodation.  Extraocular movement is intact.  Conjunctivae are not inflamed  Nose:   Normal Mouth/Pharynx:  Normal Neck:  Supple.   No adenopathy or thyromegaly Lungs:  Clear to auscultation.  Breath sounds are equal.  Heart:  Regular rate and rhythm without murmurs, rubs, or gallops.  Rate 80 Abdomen:  Nontender  Extremities:  No edema   ED Course  Procedures  none      MDM   1. Generalized anxiety disorder; decreased control   2. Palpitations resulting from anxiety     Increase Zoloft 12m to two tablets daily each morning.  Rx for clonazepam 0.270mone to three times daily prn. Follow-up with PCP as scheduled in 4 days.    StKandra NicolasMD 12/31/14 [id number] NicolasMD 12/31/14 [phone]

## 2014-12-31 ENCOUNTER — Encounter: Payer: Self-pay | Admitting: Family Medicine

## 2014-12-31 ENCOUNTER — Ambulatory Visit (INDEPENDENT_AMBULATORY_CARE_PROVIDER_SITE_OTHER): Payer: 59 | Admitting: Family Medicine

## 2014-12-31 VITALS — BP 116/62 | HR 81 | Wt 169.0 lb

## 2014-12-31 DIAGNOSIS — R0789 Other chest pain: Secondary | ICD-10-CM

## 2014-12-31 DIAGNOSIS — F411 Generalized anxiety disorder: Secondary | ICD-10-CM

## 2014-12-31 MED ORDER — SERTRALINE HCL 100 MG PO TABS: 100.0000 mg | ORAL_TABLET | Freq: Every day | ORAL | Status: DC

## 2014-12-31 NOTE — Progress Notes (Signed)
   Subjective:    Patient ID: Sue [name], female    DOB: [birth year], 44 y.o.   MRN: [id number]  HPI She has been really stressed and overwhelmed. Has a lot of deadlines at work and her dog died about a month ago. Using klonopin some and has increased her sertraline to 1103m about 4 days ago. [facility] were going well so hasn't been in awhile.   Has been having chest tightness adn says would feel like her heart was pounding to the point of needing to cough. That has gotten a little better but still getting the chest tightness. Does not cause her exacerbated by activity. No relief with rest. It can happen any time. Says the clonazepam is helping that .   Review of Systems     Objective:   Physical Exam  Constitutional: She is oriented to person, place, and time. She appears well-developed and well-nourished.  HENT:  Head: Normocephalic and atraumatic.  Cardiovascular: Normal rate, regular rhythm and normal heart sounds.   Pulmonary/Chest: Effort normal and breath sounds normal.  Neurological: She is alert and oriented to person, place, and time.  Skin: Skin is warm and dry.  Psychiatric: She has a normal mood and affect. Her behavior is normal.          Assessment & Plan:  Anxiety - gave reassurance. She was very tearful in the office today. She has restarted increased dose on her sertraline. New prescriptions sent. Gave reassurance that it may take a few weeks to really kick him. Can certainly use the clonazepam as needed but discussed the potential for dependency and to use it sparingly.  Chest tightness-gave reassurance to really think this is mood related since it does seem to respond to the clonazepam. I'm not hearing anything that is worrisome for cardiac causes. We'll go ahead and check some blood work today just to rule out anemia and thyroid problems.

## 2015-02-11 ENCOUNTER — Ambulatory Visit (INDEPENDENT_AMBULATORY_CARE_PROVIDER_SITE_OTHER): Payer: 59 | Admitting: Family Medicine

## 2015-02-11 ENCOUNTER — Encounter: Payer: Self-pay | Admitting: Family Medicine

## 2015-02-11 VITALS — BP 119/56 | HR 80 | Wt 167.0 lb

## 2015-02-11 DIAGNOSIS — F411 Generalized anxiety disorder: Secondary | ICD-10-CM | POA: Diagnosis not present

## 2015-02-11 DIAGNOSIS — R0789 Other chest pain: Secondary | ICD-10-CM

## 2015-02-11 DIAGNOSIS — R002 Palpitations: Secondary | ICD-10-CM | POA: Diagnosis not present

## 2015-02-11 LAB — EKG 12-LEAD

## 2015-02-11 MED ORDER — CLONAZEPAM 0.25 MG PO TBDP: ORAL_TABLET | ORAL | Status: DC

## 2015-02-11 NOTE — Progress Notes (Signed)
   Subjective:    Patient ID: Sue [name], female    DOB: 09/13/1971, 44 y.o.   MRN: [id number]  HPI Follow-up anxiety. She complains of feeling nervous and on edge nearly every day as well as feeling like she worries excessively and has difficulty relaxing. She's currently on sertraline 100 mg daily. She does feel like the medication has been helpful. She doesn't feel as anxious about as many things. But there still a couple of teeth issues going on in her life that are still causing a lot of stress and anxiety for her.  Still having the chest pain - the clonazepam has helped.  She hasn't gone for the labs yet.  She  Describes it as a tightness.  Says feels like happening less often but is still very worried about it being cardiac.  Before was getting more heart fluttering but that seems to be better. The fluttering can last about 20 min The chest tightness can last all day.  happens once a week on average.   Review of Systems     Objective:   Physical Exam  Constitutional: She is oriented to person, place, and time. She appears well-developed and well-nourished.  HENT:  Head: Normocephalic and atraumatic.  Cardiovascular: Normal rate, regular rhythm and normal heart sounds.   Pulmonary/Chest: Effort normal and breath sounds normal.  Neurological: She is alert and oriented to person, place, and time.  Skin: Skin is warm and dry.  Psychiatric: She has a normal mood and affect. Her behavior is normal.          Assessment & Plan:  Generalized anxiety disorder, mild-gad 7 score of 8 today. Previous was 4. She rates her symptoms is somewhat difficult. She is actually happy with her regimen. She preferred not to adjust her dose today. We'll continue with current regimen and see her back in one month just make sure that were still on track. I think even giving her some reassurance about the chest pain would be helpful for her. I think this is one of the things that has been stressing  her. Next  Atypical chest pain/tightness-did encourage her to go for the blood work that we had ordered at the last visit just rule out anemia and thyroid problems etc. Did do an EKG today. There is one in the chart for comparison from 2013. Also get a chest x-ray performed. She can have this done at [facility] to her work. EKG today shows rate of 70 bpm, normal sinus rhythm with normal axis and no acute ST-T wave changes.  Palpitations-could consider cardiac monitor. It sounds like though the frequency of the palpitations has decreased since being on the sertraline.

## 2015-02-15 LAB — COMPLETE METABOLIC PANEL WITH GFR
ALT: 14 U/L (ref 0–35)
AST: 13 U/L (ref 0–37)
Albumin: 4.1 g/dL (ref 3.5–5.2)
Alkaline Phosphatase: 55 U/L (ref 39–117)
BILIRUBIN TOTAL: 0.5 mg/dL (ref 0.2–1.2)
BUN: 13 mg/dL (ref 6–23)
CO2: 28 mEq/L (ref 19–32)
CREATININE: 0.65 mg/dL (ref 0.50–1.10)
Calcium: 8.8 mg/dL (ref 8.4–10.5)
Chloride: 106 mEq/L (ref 96–112)
GFR, Est African American: >89 mL/min
GFR, Est Non African American: >89 mL/min
Glucose, Bld: 83 mg/dL (ref 70–99)
Potassium: 4.5 mEq/L (ref 3.5–5.3)
Sodium: 141 mEq/L (ref 135–145)
Total Protein: 6.6 g/dL (ref 6.0–8.3)

## 2015-02-15 LAB — LIPID PANEL
CHOL/HDL RATIO: 2.4 ratio
CHOLESTEROL: 117 mg/dL (ref 0–200)
HDL: 48 mg/dL (ref >=46)
LDL Cholesterol: 60 mg/dL (ref 0–99)
TRIGLYCERIDES: 45 mg/dL (ref <150)
VLDL: 9 mg/dL (ref 0–40)

## 2015-02-15 LAB — CBC WITH DIFFERENTIAL/PLATELET
BASOS ABS: 0 10*3/uL (ref 0.0–0.1)
Basophils Relative: 1 % (ref 0–1)
EOS ABS: 0.1 10*3/uL (ref 0.0–0.7)
EOS PCT: 3 % (ref 0–5)
HEMATOCRIT: 39.9 % (ref 36.0–46.0)
HEMOGLOBIN: 13.5 g/dL (ref 12.0–15.0)
LYMPHS PCT: 36 % (ref 12–46)
Lymphs Abs: 1.8 10*3/uL (ref 0.7–4.0)
MCH: 30.6 pg (ref 26.0–34.0)
MCHC: 33.8 g/dL (ref 30.0–36.0)
MCV: 90.5 fL (ref 78.0–100.0)
MPV: 11.5 fL (ref 8.6–12.4)
Monocytes Absolute: 0.3 10*3/uL (ref 0.1–1.0)
Monocytes Relative: 7 % (ref 3–12)
Neutro Abs: 2.6 10*3/uL (ref 1.7–7.7)
Neutrophils Relative %: 53 % (ref 43–77)
PLATELETS: 185 10*3/uL (ref 150–400)
RBC: 4.41 MIL/uL (ref 3.87–5.11)
RDW: 12.6 % (ref 11.5–15.5)
WBC: 4.9 10*3/uL (ref 4.0–10.5)

## 2015-02-15 LAB — TSH: TSH: 1.933 u[IU]/mL (ref 0.350–4.500)

## 2015-02-15 NOTE — Progress Notes (Signed)
Quick Note:  All labs are normal. ______

## 2015-02-21 NOTE — Addendum Note (Signed)
Addended by: Narda Rutherford on: 02/21/2015 10:10 AM   Modules accepted: Orders

## 2015-03-08 ENCOUNTER — Ambulatory Visit: Payer: 59 | Admitting: Family Medicine

## 2015-03-12 LAB — HM PAP SMEAR: HM PAP: NEGATIVE

## 2015-03-13 ENCOUNTER — Other Ambulatory Visit: Payer: Self-pay

## 2015-03-22 ENCOUNTER — Ambulatory Visit (HOSPITAL_COMMUNITY)
Admission: RE | Admit: 2015-03-22 | Discharge: 2015-03-22 | Disposition: A | Discharge status: Home / Self Care | Payer: 59 | Source: Ambulatory Visit | Attending: Family Medicine | Admitting: Family Medicine

## 2015-03-22 DIAGNOSIS — R079 Chest pain, unspecified: Secondary | ICD-10-CM | POA: Diagnosis not present

## 2015-03-22 DIAGNOSIS — R002 Palpitations: Secondary | ICD-10-CM | POA: Diagnosis not present

## 2015-03-22 DIAGNOSIS — R0789 Other chest pain: Secondary | ICD-10-CM

## 2015-04-09 ENCOUNTER — Other Ambulatory Visit: Payer: Self-pay | Admitting: Family Medicine

## 2015-04-09 MED ORDER — CLONAZEPAM 0.25 MG PO TBDP: ORAL_TABLET | ORAL | Status: DC

## 2015-04-09 NOTE — Telephone Encounter (Signed)
Patient needs appointment with Provider for any further refills.

## 2015-04-26 ENCOUNTER — Ambulatory Visit: Payer: 59 | Admitting: Family Medicine

## 2015-07-18 ENCOUNTER — Encounter: Payer: Self-pay | Admitting: Sports Medicine

## 2015-07-18 ENCOUNTER — Ambulatory Visit (INDEPENDENT_AMBULATORY_CARE_PROVIDER_SITE_OTHER): Payer: 59 | Admitting: Sports Medicine

## 2015-07-18 VITALS — BP 119/75 | HR 90 | Ht 66.0 in | Wt 159.0 lb

## 2015-07-18 DIAGNOSIS — M7711 Lateral epicondylitis, right elbow: Secondary | ICD-10-CM | POA: Diagnosis not present

## 2015-07-18 MED ORDER — MELOXICAM 15 MG PO TABS: ORAL_TABLET | ORAL | Status: DC

## 2015-07-18 NOTE — Assessment & Plan Note (Signed)
This is run-of-the-mill lateral epicondylitis.  X-rays, 4 sprays, meloxicam, formal PT. Return in one month, injection if no better, if still no improvement we will do platelet rich plasma.

## 2015-07-18 NOTE — Progress Notes (Signed)
   Subjective:    I'm seeing this patient as a consultation for:  Dr. Beatrice Lecher  CC: Right elbow pain  HPI: For the past several months this pleasant 44 year old female has had pain that she localizes over the lateral elbow, moderate, persistent with radiation into the forearm, tenderness is directly over the lateral epicondyles. No trauma. No constitutional symptoms. Difficult to grasp, grip, and turn a door handle.  Past medical history, Surgical history, Family history not pertinant except as noted below, Social history, Allergies, and medications have been entered into the medical record, reviewed, and no changes needed.   Review of Systems: No headache, visual changes, nausea, vomiting, diarrhea, constipation, dizziness, abdominal pain, skin rash, fevers, chills, night sweats, weight loss, swollen lymph nodes, body aches, joint swelling, muscle aches, chest pain, shortness of breath, mood changes, visual or auditory hallucinations.   Objective:   General: Well Developed, well nourished, and in no acute distress.  Neuro/Psych: Alert and oriented x3, extra-ocular muscles intact, able to move all 4 extremities, sensation grossly intact. Skin: Warm and dry, no rashes noted.  Respiratory: Not using accessory muscles, speaking in full sentences, trachea midline.  Cardiovascular: Pulses palpable, no extremity edema. Abdomen: Does not appear distended. Right Elbow: Unremarkable to inspection. Range of motion full pronation, supination, flexion, extension. Strength is full to all of the above directions Stable to varus, valgus stress. Negative moving valgus stress test. Tender to palpation at the common extensor tendon origin, reproduction of pain with resisted extension of the middle finger. Ulnar nerve does not sublux. Negative cubital tunnel Tinel's.  Impression and Recommendations:   This case required medical decision making of moderate complexity.

## 2015-08-04 ENCOUNTER — Telehealth: Payer: 59 | Admitting: Family

## 2015-08-04 DIAGNOSIS — M545 Low back pain: Secondary | ICD-10-CM | POA: Diagnosis not present

## 2015-08-04 MED ORDER — CYCLOBENZAPRINE HCL 10 MG PO TABS: 10.0000 mg | ORAL_TABLET | Freq: Three times a day (TID) | ORAL | Status: DC | PRN

## 2015-08-04 MED ORDER — ETODOLAC 300 MG PO CAPS: 200.0000 mg | ORAL_CAPSULE | Freq: Two times a day (BID) | ORAL | Status: DC

## 2015-08-04 NOTE — Progress Notes (Signed)

## 2015-08-16 ENCOUNTER — Encounter: Payer: Self-pay | Admitting: Family Medicine

## 2015-08-16 ENCOUNTER — Ambulatory Visit (INDEPENDENT_AMBULATORY_CARE_PROVIDER_SITE_OTHER): Payer: 59 | Admitting: Sports Medicine

## 2015-08-16 ENCOUNTER — Ambulatory Visit (INDEPENDENT_AMBULATORY_CARE_PROVIDER_SITE_OTHER): Payer: 59 | Admitting: Family Medicine

## 2015-08-16 ENCOUNTER — Encounter: Payer: Self-pay | Admitting: Sports Medicine

## 2015-08-16 VITALS — BP 120/67 | HR 69 | Wt 161.0 lb

## 2015-08-16 VITALS — BP 131/75 | HR 98 | Ht 66.0 in | Wt 160.0 lb

## 2015-08-16 DIAGNOSIS — F411 Generalized anxiety disorder: Secondary | ICD-10-CM | POA: Diagnosis not present

## 2015-08-16 DIAGNOSIS — M545 Low back pain, unspecified: Secondary | ICD-10-CM | POA: Insufficient documentation

## 2015-08-16 DIAGNOSIS — M7711 Lateral epicondylitis, right elbow: Secondary | ICD-10-CM

## 2015-08-16 MED ORDER — ALBUTEROL SULFATE HFA 108 (90 BASE) MCG/ACT IN AERS: 2.0000 | INHALATION_SPRAY | RESPIRATORY_TRACT | Status: DC | PRN

## 2015-08-16 MED ORDER — SERTRALINE HCL 100 MG PO TABS: 100.0000 mg | ORAL_TABLET | Freq: Every day | ORAL | Status: DC

## 2015-08-16 NOTE — Progress Notes (Signed)
   Subjective:    I'm seeing this patient as a consultation for:  Dr. Beatrice Lecher  CC: Low back pain  HPI: This is a pleasant 44 year old female, for the past 2 weeks she's had pain that she localizes on the right side of her lower back, moderate, persistent without radiation, nothing radicular, no fevers, chills, night sweats, no trauma, no history of cancer. She did a visit and was prescribed Flexeril and total lack, overall she is improving. She has not been doing any rehabilitation exercises. No bowel or bladder dysfunction, no saddle numbness.  Lateral epicondylitis: Completely resolved with conservative measures.  Past medical history, Surgical history, Family history not pertinant except as noted below, Social history, Allergies, and medications have been entered into the medical record, reviewed, and no changes needed.   Review of Systems: No headache, visual changes, nausea, vomiting, diarrhea, constipation, dizziness, abdominal pain, skin rash, fevers, chills, night sweats, weight loss, swollen lymph nodes, body aches, joint swelling, muscle aches, chest pain, shortness of breath, mood changes, visual or auditory hallucinations.   Objective:   General: Well Developed, well nourished, and in no acute distress.  Neuro/Psych: Alert and oriented x3, extra-ocular muscles intact, able to move all 4 extremities, sensation grossly intact. Skin: Warm and dry, no rashes noted.  Respiratory: Not using accessory muscles, speaking in full sentences, trachea midline.  Cardiovascular: Pulses palpable, no extremity edema. Abdomen: Does not appear distended. Back Exam:  Inspection: Unremarkable , small tuft of hair Motion: Flexion 45 deg, Extension 45 deg, Side Bending to 45 deg bilaterally,  Rotation to 45 deg bilaterally  SLR laying: Negative  XSLR laying: Negative  Palpable tenderness: Only minimal tenderness at the lower right sacroiliac joint. FABER: negative. Sensory change:  Gross sensation intact to all lumbar and sacral dermatomes.  Reflexes: 2+ at both patellar tendons, 2+ at achilles tendons, Babinski's downgoing.  Strength at foot  Plantar-flexion: 5/5 Dorsi-flexion: 5/5 Eversion: 5/5 Inversion: 5/5  Leg strength  Quad: 5/5 Hamstring: 5/5 Hip flexor: 5/5 Hip abductors: 5/5  Gait unremarkable.  Impression and Recommendations:   This case required medical decision making of moderate complexity.

## 2015-08-16 NOTE — Assessment & Plan Note (Signed)
Right-sided, sacrum, axial without radiculopathy or red flags. Continue meloxicam, daily at bedtime Flexeril, and I'm going to add aggressive home rehabilitation exercises. Return to see me in 4 weeks, MRI if no better.

## 2015-08-16 NOTE — Progress Notes (Signed)
   Subjective:    Patient ID: Sue [name], female    DOB: November 03, 1971, 44 y.o.   MRN: [id number]  HPI GAD- says she is doing well and occ using her clonazpam. She is doing well. Still feeling nervous several days a week. She has had a difficult summer. She is seeing. She is seeing a therapist at [place].  She and her husband have been going through some marital issues which she has been working on with the therapist. And she's also had some stressful things at work but work has actually been batched better lately. She continues to take the sertraline 100 mg daily and is happy with her current dosing regimen and does not want to change it.  Review of Systems     Objective:   Physical Exam  Constitutional: She is oriented to person, place, and time. She appears well-developed and well-nourished.  HENT:  Head: Normocephalic and atraumatic.  Cardiovascular: Normal rate, regular rhythm and normal heart sounds.   Pulmonary/Chest: Effort normal and breath sounds normal.  Neurological: She is alert and oriented to person, place, and time.  Skin: Skin is warm and dry.  Psychiatric: She has a normal mood and affect. Her behavior is normal.          Assessment & Plan:  GAD -  Continue current regimen of sertraline 100 mg daily and continue therapy. I think this is been really helpful for her. Follow-up in 6 months. Strongly encouraged her to call me at any point in time if she feels like when he to make adjustments or if she just needs to come in and talk. Continue o use the clonazepam sparingly. Time spent 20 min, > 50% spent counseling about anxiety.

## 2015-08-16 NOTE — Assessment & Plan Note (Signed)
Completed a resolved with conservative measures

## 2015-09-04 ENCOUNTER — Other Ambulatory Visit: Payer: Self-pay | Admitting: Family Medicine

## 2015-09-05 MED ORDER — CLONAZEPAM 0.25 MG PO TBDP: ORAL_TABLET | ORAL | Status: DC

## 2015-09-17 ENCOUNTER — Ambulatory Visit: Payer: 59 | Admitting: Sports Medicine

## 2016-02-04 MED FILL — SERTRALINE HCL 100 MG TAB: 100 | 90 days supply | Qty: 90 | Fill #1

## 2016-02-13 ENCOUNTER — Ambulatory Visit (INDEPENDENT_AMBULATORY_CARE_PROVIDER_SITE_OTHER): Payer: 59 | Admitting: Family Medicine

## 2016-02-13 ENCOUNTER — Encounter: Payer: Self-pay | Admitting: Family Medicine

## 2016-02-13 VITALS — BP 108/48 | HR 65 | Wt 159.0 lb

## 2016-02-13 DIAGNOSIS — F411 Generalized anxiety disorder: Secondary | ICD-10-CM

## 2016-02-13 DIAGNOSIS — Z13220 Encounter for screening for lipoid disorders: Secondary | ICD-10-CM

## 2016-02-13 DIAGNOSIS — Z114 Encounter for screening for human immunodeficiency virus [HIV]: Secondary | ICD-10-CM | POA: Diagnosis not present

## 2016-02-13 MED ORDER — SERTRALINE HCL 100 MG PO TABS: 100.0000 mg | ORAL_TABLET | Freq: Every day | ORAL | Status: DC

## 2016-02-13 NOTE — Progress Notes (Signed)
   Subjective:    Patient ID: Sue [name], female    DOB: 01-Jul-1971, 45 y.o.   MRN: [id number]  HPI Here for follow-up generalized anxiety disorder-I last saw her 6 months ago. She is currently on sertraline 100 mg daily and uses clonazepam occasionally. She was previously working with a marital therapist. Things are going better with her marriage but there are no longer doing counseling. She said they really just couldn't find the right fit for therapist. But overall she feels like things are in a good place and is actually ready to start weaning her medication.   Review of Systems     Objective:   Physical Exam  Constitutional: She is oriented to person, place, and time. She appears well-developed and well-nourished.  HENT:  Head: Normocephalic and atraumatic.  Cardiovascular: Normal rate, regular rhythm and normal heart sounds.   Pulmonary/Chest: Effort normal and breath sounds normal.  Neurological: She is alert and oriented to person, place, and time.  Skin: Skin is warm and dry.  Psychiatric: She has a normal mood and affect. Her behavior is normal.          Assessment & Plan:  Generalized anxiety disorder-stable overall. In fact she's actually at the point where she like to start weaning down her medication potentially. Her dad 7 score was 6 today. Things are going better with her marriage. They are no longer doing counseling. We discussed decreasing her medication down to half of a tab for a month to see how she does. If she is doing well and we can stick with that for a while and then discontinue and wean the medication when she is ready to. Otherwise if she feels like her mood is starting to slip some and she can always go back up to a whole tab if needed. If she is doing well then I will see her back in 6 months. I did encourage regular exercise as well.  Time spent 20 minutes, greater than 50% time spent counseling about anxiety.

## 2016-03-17 ENCOUNTER — Other Ambulatory Visit: Payer: Self-pay

## 2016-03-17 DIAGNOSIS — Z1231 Encounter for screening mammogram for malignant neoplasm of breast: Secondary | ICD-10-CM

## 2016-03-27 ENCOUNTER — Ambulatory Visit
Admission: RE | Admit: 2016-03-27 | Discharge: 2016-03-27 | Disposition: A | Discharge status: Home / Self Care | Payer: 59 | Source: Ambulatory Visit

## 2016-03-27 DIAGNOSIS — Z1231 Encounter for screening mammogram for malignant neoplasm of breast: Secondary | ICD-10-CM

## 2016-04-17 ENCOUNTER — Encounter: Payer: Self-pay | Admitting: Sports Medicine

## 2016-04-17 ENCOUNTER — Ambulatory Visit (INDEPENDENT_AMBULATORY_CARE_PROVIDER_SITE_OTHER): Payer: 59 | Admitting: Sports Medicine

## 2016-04-17 VITALS — BP 107/69 | HR 68 | Resp 16 | Wt 160.4 lb

## 2016-04-17 DIAGNOSIS — M79672 Pain in left foot: Secondary | ICD-10-CM

## 2016-04-17 DIAGNOSIS — M79671 Pain in right foot: Secondary | ICD-10-CM

## 2016-04-17 NOTE — Patient Instructions (Signed)
Start a walk-run progression: - I would like you to do line drills (try to keep foot on a line when jogging). - Initially start one minute walking than one minute running for 20 mins in the first week,   then 25 mins during the second week, then 30 mins afterwards.  Once you have reached 30 mins: - Run 2 mins, then walk 1 min. -Then run 3 mins, and walk 1 min. -Then run 4 mins, and walk 1 min. -Then run 5 mins, and walk 1 min. -Slowly build up weekly to running 30 mins nonstop.  If painful at any of the steps, back up one step.

## 2016-04-17 NOTE — Progress Notes (Addendum)
    Patient was fitted for a : standard, cushioned, semi-rigid orthotic. The orthotic was heated and afterward the patient stood on the orthotic blank positioned on the orthotic stand. The patient was positioned in subtalar neutral position and 10 degrees of ankle dorsiflexion in a weight bearing stance. After completion of molding, a stable base was applied to the orthotic blank. The blank was ground to a stable position for weight bearing. Size: 9 Base: [employer] and safety inspector and Padding: None The patient ambulated these, and they were very comfortable. Gait analyzed, external rotation of the right foot visible and when measured, is 2 cm longer than the left.  I spent 40 minutes with this patient, greater than 50% was face-to-face time counseling regarding the below diagnosis.

## 2016-04-17 NOTE — Assessment & Plan Note (Addendum)
Custom orthotics as above. Starting a walker and regimen. If any discomfort we will add a small lift to the left orthotic.

## 2016-05-20 DIAGNOSIS — B079 Viral wart, unspecified: Secondary | ICD-10-CM | POA: Diagnosis not present

## 2016-05-20 DIAGNOSIS — L739 Follicular disorder, unspecified: Secondary | ICD-10-CM | POA: Diagnosis not present

## 2016-05-20 DIAGNOSIS — D485 Neoplasm of uncertain behavior of skin: Secondary | ICD-10-CM | POA: Diagnosis not present

## 2016-05-20 DIAGNOSIS — Z411 Encounter for cosmetic surgery: Secondary | ICD-10-CM | POA: Diagnosis not present

## 2016-05-20 DIAGNOSIS — B078 Other viral warts: Secondary | ICD-10-CM | POA: Diagnosis not present

## 2016-05-20 DIAGNOSIS — L570 Actinic keratosis: Secondary | ICD-10-CM | POA: Diagnosis not present

## 2016-05-20 DIAGNOSIS — L814 Other melanin hyperpigmentation: Secondary | ICD-10-CM | POA: Diagnosis not present

## 2016-05-20 DIAGNOSIS — L719 Rosacea, unspecified: Secondary | ICD-10-CM | POA: Diagnosis not present

## 2016-05-20 MED FILL — FINACEA 15% GEL: 15 | 30 days supply | Qty: 50 | Fill #0

## 2016-08-07 ENCOUNTER — Other Ambulatory Visit: Payer: Self-pay | Admitting: Family Medicine

## 2016-08-07 MED FILL — SERTRALINE HCL 100 MG TAB: 100 | 90 days supply | Qty: 90 | Fill #0

## 2016-08-17 ENCOUNTER — Telehealth: Payer: Self-pay | Admitting: Family Medicine

## 2016-08-17 ENCOUNTER — Ambulatory Visit (INDEPENDENT_AMBULATORY_CARE_PROVIDER_SITE_OTHER): Payer: 59 | Admitting: Family Medicine

## 2016-08-17 ENCOUNTER — Encounter: Payer: Self-pay | Admitting: Family Medicine

## 2016-08-17 VITALS — BP 121/61 | HR 89 | Ht 66.0 in | Wt 168.0 lb

## 2016-08-17 DIAGNOSIS — F411 Generalized anxiety disorder: Secondary | ICD-10-CM

## 2016-08-17 DIAGNOSIS — J069 Acute upper respiratory infection, unspecified: Secondary | ICD-10-CM

## 2016-08-17 MED ORDER — SERTRALINE HCL 100 MG PO TABS: 100.0000 mg | ORAL_TABLET | Freq: Every day | ORAL | 1 refills | Status: DC

## 2016-08-17 NOTE — Progress Notes (Signed)
Subjective:    CC: Anxiety  HPI:  GAD- Overall she feels like she's doing fairly well. She says her marriage is in a better place than it was previously. They're not actively in counseling at this point. Work has been a little bit stressful. She said she very rarely uses the clonazepam and is very happy with the Zoloft. She does want to continue it for this time.   She did develop cold symptoms over the weekend with cough and nasal congestion. She's not had any fever.  Past medical history, Surgical history, Family history not pertinant except as noted below, Social history, Allergies, and medications have been entered into the medical record, reviewed, and corrections made.   Review of Systems: No fevers, chills, night sweats, weight loss, chest pain, or shortness of breath.   Objective:    General: Well Developed, well nourished, and in no acute distress.  Neuro: Alert and oriented x3, extra-ocular muscles intact, sensation grossly intact.  HEENT: Normocephalic, atraumatic  Skin: Warm and dry, no rashes. Cardiac: Regular rate and rhythm, no murmurs rubs or gallops, no lower extremity edema.  Respiratory: Clear to auscultation bilaterally. Not using accessory muscles, speaking in full sentences.   Impression and Recommendations:    GAD- Overall overall well controlled. We'll continue with current regimen. She's not having any side effects or problems. Follow-up in 6 months. GAD score of 3, previsou was 6.   URI - viral. Call if not improving.

## 2016-08-17 NOTE — Telephone Encounter (Signed)
Left VM for Pt to return clinic call regarding information.

## 2016-08-17 NOTE — Telephone Encounter (Signed)
Please call pt and see where she goes for pap. I have her last one was 3 yr ago. See if can call for report.    Beatrice Lecher, MD

## 2016-08-18 DIAGNOSIS — F411 Generalized anxiety disorder: Secondary | ICD-10-CM | POA: Diagnosis not present

## 2016-08-18 DIAGNOSIS — Z13220 Encounter for screening for lipoid disorders: Secondary | ICD-10-CM | POA: Diagnosis not present

## 2016-08-18 DIAGNOSIS — Z114 Encounter for screening for human immunodeficiency virus [HIV]: Secondary | ICD-10-CM | POA: Diagnosis not present

## 2016-08-18 LAB — COMPLETE METABOLIC PANEL WITH GFR
ALBUMIN: 4.3 g/dL (ref 3.6–5.1)
ALK PHOS: 59 U/L (ref 33–115)
ALT: 16 U/L (ref 6–29)
AST: 14 U/L (ref 10–30)
BUN: 14 mg/dL (ref 7–25)
CO2: 22 mmol/L (ref 20–31)
Calcium: 9.1 mg/dL (ref 8.6–10.2)
Chloride: 106 mmol/L (ref 98–110)
Creat: 0.78 mg/dL (ref 0.50–1.10)
GFR, Est African American: >89 mL/min (ref >=60)
GLUCOSE: 97 mg/dL (ref 65–99)
POTASSIUM: 4.4 mmol/L (ref 3.5–5.3)
SODIUM: 141 mmol/L (ref 135–146)
Total Bilirubin: 0.5 mg/dL (ref 0.2–1.2)
Total Protein: 6.8 g/dL (ref 6.1–8.1)

## 2016-08-18 LAB — LIPID PANEL
Cholesterol: 116 mg/dL — ABNORMAL LOW (ref 125–200)
HDL: 47 mg/dL (ref >=46)
LDL Cholesterol: 59 mg/dL (ref <130)
TRIGLYCERIDES: 51 mg/dL (ref <150)
Total CHOL/HDL Ratio: 2.5 Ratio (ref <=5.0)
VLDL: 10 mg/dL (ref <30)

## 2016-08-18 LAB — TSH: TSH: 2.41 mIU/L

## 2016-08-19 LAB — HIV ANTIBODY (ROUTINE TESTING W REFLEX): HIV: NONREACTIVE

## 2016-08-19 NOTE — Progress Notes (Signed)
All labs are normal.

## 2016-08-20 ENCOUNTER — Encounter: Payer: Self-pay | Admitting: Family Medicine

## 2016-08-20 NOTE — Telephone Encounter (Signed)
Shanaia called back and stated she received her last PAP with (P) [phone] (F) [place] [address] [place],  [address]. I called and left a message with Medical Records. I asked them to fax [place] the most recent PAP report.

## 2016-08-20 NOTE — Telephone Encounter (Signed)
Received PAP results and have scanned into chart. Health Maintenance has been up dated.

## 2016-08-20 NOTE — Telephone Encounter (Signed)
Left another message for Pt, callback provided.

## 2016-11-05 DIAGNOSIS — L918 Other hypertrophic disorders of the skin: Secondary | ICD-10-CM | POA: Diagnosis not present

## 2016-11-05 DIAGNOSIS — L219 Seborrheic dermatitis, unspecified: Secondary | ICD-10-CM | POA: Diagnosis not present

## 2016-11-05 DIAGNOSIS — Z85828 Personal history of other malignant neoplasm of skin: Secondary | ICD-10-CM | POA: Diagnosis not present

## 2016-11-05 DIAGNOSIS — Z23 Encounter for immunization: Secondary | ICD-10-CM | POA: Diagnosis not present

## 2016-11-05 DIAGNOSIS — D225 Melanocytic nevi of trunk: Secondary | ICD-10-CM | POA: Diagnosis not present

## 2016-11-05 MED FILL — TRIAMCINOLONE 0.1% CREAM: 0.1 | 14 days supply | Qty: 15 | Fill #0

## 2016-11-18 MED FILL — SERTRALINE HCL 100 MG TAB: 100 | 90 days supply | Qty: 90 | Fill #1

## 2017-02-12 ENCOUNTER — Ambulatory Visit (INDEPENDENT_AMBULATORY_CARE_PROVIDER_SITE_OTHER): Payer: 59 | Admitting: Family Medicine

## 2017-02-12 ENCOUNTER — Encounter: Payer: Self-pay | Admitting: Family Medicine

## 2017-02-12 VITALS — BP 135/57 | HR 77 | Ht 66.0 in | Wt 174.0 lb

## 2017-02-12 DIAGNOSIS — R7309 Other abnormal glucose: Secondary | ICD-10-CM | POA: Diagnosis not present

## 2017-02-12 DIAGNOSIS — F411 Generalized anxiety disorder: Secondary | ICD-10-CM | POA: Diagnosis not present

## 2017-02-12 DIAGNOSIS — H9193 Unspecified hearing loss, bilateral: Secondary | ICD-10-CM

## 2017-02-12 LAB — POCT GLYCOSYLATED HEMOGLOBIN (HGB A1C): Hemoglobin A1C: 5.3

## 2017-02-12 MED ORDER — SERTRALINE HCL 100 MG PO TABS: 100.0000 mg | ORAL_TABLET | Freq: Every day | ORAL | 1 refills | Status: DC

## 2017-02-12 MED ORDER — CLONAZEPAM 0.25 MG PO TBDP
ORAL_TABLET | ORAL | 0 refills | Status: DC
Start: 2017-02-12 — End: 2017-08-16

## 2017-02-12 MED ORDER — PREDNISONE 20 MG PO TABS: 40.0000 mg | ORAL_TABLET | Freq: Every day | ORAL | 0 refills | Status: DC

## 2017-02-12 MED FILL — predniSONE 20 MG TABS: 20 | 5 days supply | Qty: 10 | Fill #0

## 2017-02-12 MED FILL — SERTRALINE HCL 100 MG TAB: 100 | 90 days supply | Qty: 90 | Fill #0

## 2017-02-12 NOTE — Progress Notes (Signed)
Subjective:    CC: Anxiety  HPI: Six-month follow-up for anxiety. She was. Having some marital problems that seem conduction getting better. She's currently on Zoloft. Uses clonazepam as needed. Overall she is doing really well and denies any significant problems. She still has stress at work and with her kids but says she feels like it's normal stress. It doesn't feel overwhelming and she doesn't feel like it's making her more anxious than usual. She reports that she sleeping well. She rarely uses clonazepam. In fact her original prescription has expired.  She also complains that over the last few months she's noticed that she's had a hard time hearing. She's had to have people repeat things. She feels like it's bilateral. She's not expressing any ear pain or discharge. She did recently fly and has difficulty getting her ear pops and when she came home she took decongestants for a few days and that helped but she was Artie experiencing some of the hearing issue beforehand.  She would also like to be tested for diabetes. She has a history of gestational diabetes. Her mother has prediabetes and her sister who is 53 years older than her was just diagnosed with prediabetes. Her maternal grandfather was also diabetic.   Past medical history, Surgical history, Family history not pertinant except as noted below, Social history, Allergies, and medications have been entered into the medical record, reviewed, and corrections made.   Review of Systems: No fevers, chills, night sweats, weight loss, chest pain, or shortness of breath.   Objective:    General: Well Developed, well nourished, and in no acute distress.  Neuro: Alert and oriented x3, extra-ocular muscles intact, sensation grossly intact.  HEENT: Normocephalic, atraumatic, Oropharynx is clear, right canal is blocked by cerumen. Left TM is retracted but otherwise clear. Skin: Warm and dry, no rashes. Cardiac: Regular rate and rhythm, no murmurs  rubs or gallops, no lower extremity edema.  Respiratory: Clear to auscultation bilaterally. Not using accessory muscles, speaking in full sentences.   Impression and Recommendations:    Generalized anxiety disorder -Doing very well. We'll go ahead and refill medications today.   Abnormal glucose - A1c is completely normal today. No sign of prediabetes.As of her family history I would encourage her to get tested every couple of years. Lab Results  Component Value Date   HGBA1C 5.3 02/12/2017     Hearing Loss - She did have a cerumen impaction in the right ear. We irrigated that today. Patient tolerated well. TM and canal look normal. The tympanic membrane on the left was retracted. Start on oral prednisone for 5 days. If hearing does not improve over the next week then recommend formal evaluation by ear nose and throat with audiometry. She passed a hearing screen here in the office today.  Indication: Cerumen impaction of the ear(s)  Medical necessity statement: On physical examination, cerumen impairs clinically significant portions of the external auditory canal, and tympanic membrane. Noted obstructive, copious cerumen that cannot be removed without magnification and irrigation.  Consent: Discussed benefits and risks of procedure and verbal consent obtained Procedure: Patient was prepped for the procedure. Utilized an otoscope to assess and take note of the ear canal, the tympanic membrane, and the presence, amount, and placement of the cerumen. Gentle water irrigation  was utilized to remove cerumen.  Post procedure examination: shows cerumen was completely removed. Patient tolerated procedure well. The patient is made aware that they may experience temporary vertigo, temporary hearing loss, and temporary discomfort. If  these symptom last for more than 24 hours to call the clinic or proceed to the ED.

## 2017-02-19 MED FILL — clonazePAM 0.25 MG TBDP: 0.25 | 3 days supply | Qty: 10 | Fill #0

## 2017-05-17 MED FILL — SERTRALINE HCL 100 MG TAB: 100 | 90 days supply | Qty: 90 | Fill #1

## 2017-05-28 ENCOUNTER — Other Ambulatory Visit: Payer: Self-pay | Admitting: Family Medicine

## 2017-05-28 MED FILL — VENTOLIN HFA 90 MCG INHALER: 108 (90 BAS | 16 days supply | Qty: 18 | Fill #0

## 2017-06-09 ENCOUNTER — Emergency Department (HOSPITAL_BASED_OUTPATIENT_CLINIC_OR_DEPARTMENT_OTHER)
Admission: EM | Admit: 2017-06-09 | Discharge: 2017-06-09 | Disposition: A | Discharge status: Home / Self Care | Payer: 59 | Attending: Emergency Medicine | Admitting: Emergency Medicine

## 2017-06-09 ENCOUNTER — Emergency Department (HOSPITAL_BASED_OUTPATIENT_CLINIC_OR_DEPARTMENT_OTHER): Payer: 59

## 2017-06-09 ENCOUNTER — Encounter (HOSPITAL_BASED_OUTPATIENT_CLINIC_OR_DEPARTMENT_OTHER): Payer: Self-pay | Admitting: Respiratory Therapy

## 2017-06-09 DIAGNOSIS — J4541 Moderate persistent asthma with (acute) exacerbation: Secondary | ICD-10-CM | POA: Insufficient documentation

## 2017-06-09 DIAGNOSIS — J45909 Unspecified asthma, uncomplicated: Secondary | ICD-10-CM | POA: Diagnosis not present

## 2017-06-09 DIAGNOSIS — Z79899 Other long term (current) drug therapy: Secondary | ICD-10-CM | POA: Diagnosis not present

## 2017-06-09 DIAGNOSIS — J45901 Unspecified asthma with (acute) exacerbation: Secondary | ICD-10-CM | POA: Diagnosis not present

## 2017-06-09 DIAGNOSIS — R079 Chest pain, unspecified: Secondary | ICD-10-CM | POA: Diagnosis not present

## 2017-06-09 LAB — CBC WITH DIFFERENTIAL/PLATELET
Basophils Absolute: 0 10*3/uL (ref 0.0–0.1)
Basophils Relative: 0 %
EOS PCT: 3 %
Eosinophils Absolute: 0.3 10*3/uL (ref 0.0–0.7)
HEMATOCRIT: 38.2 % (ref 36.0–46.0)
Hemoglobin: 13.4 g/dL (ref 12.0–15.0)
LYMPHS ABS: 2.7 10*3/uL (ref 0.7–4.0)
LYMPHS PCT: 32 %
MCH: 31.1 pg (ref 26.0–34.0)
MCHC: 35.1 g/dL (ref 30.0–36.0)
MCV: 88.6 fL (ref 78.0–100.0)
MONO ABS: 0.8 10*3/uL (ref 0.1–1.0)
MONOS PCT: 9 %
NEUTROS ABS: 4.6 10*3/uL (ref 1.7–7.7)
Neutrophils Relative %: 56 %
PLATELETS: 167 10*3/uL (ref 150–400)
RBC: 4.31 MIL/uL (ref 3.87–5.11)
RDW: 11.5 % (ref 11.5–15.5)
WBC: 8.4 10*3/uL (ref 4.0–10.5)

## 2017-06-09 LAB — COMPREHENSIVE METABOLIC PANEL
ALT: 20 U/L (ref 14–54)
ANION GAP: 9 (ref 5–15)
AST: 17 U/L (ref 15–41)
Albumin: 3.8 g/dL (ref 3.5–5.0)
Alkaline Phosphatase: 57 U/L (ref 38–126)
BILIRUBIN TOTAL: 0.4 mg/dL (ref 0.3–1.2)
BUN: 13 mg/dL (ref 6–20)
CHLORIDE: 106 mmol/L (ref 101–111)
CO2: 24 mmol/L (ref 22–32)
Calcium: 9 mg/dL (ref 8.9–10.3)
Creatinine, Ser: 0.71 mg/dL (ref 0.44–1.00)
Glucose, Bld: 103 mg/dL — ABNORMAL HIGH (ref 65–99)
POTASSIUM: 3.5 mmol/L (ref 3.5–5.1)
Sodium: 139 mmol/L (ref 135–145)
TOTAL PROTEIN: 6.8 g/dL (ref 6.5–8.1)

## 2017-06-09 LAB — PREGNANCY, URINE: Preg Test, Ur: NEGATIVE

## 2017-06-09 LAB — D-DIMER, QUANTITATIVE: D-Dimer, Quant: <0.27 ug/mL-FEU (ref 0.00–0.50)

## 2017-06-09 LAB — TROPONIN I

## 2017-06-09 MED ORDER — PREDNISONE 50 MG PO TABS: 50.0000 mg | ORAL_TABLET | Freq: Every day | ORAL | 0 refills | Status: DC

## 2017-06-09 MED ORDER — PREDNISONE 50 MG PO TABS
60.0000 mg | ORAL_TABLET | Freq: Once | ORAL | Status: AC
  Administered 2017-06-09: 60 mg via ORAL
  Filled 2017-06-09: qty 1

## 2017-06-09 MED ORDER — IPRATROPIUM-ALBUTEROL 0.5-2.5 (3) MG/3ML IN SOLN
3.0000 mL | Freq: Once | RESPIRATORY_TRACT | Status: AC
  Administered 2017-06-09: 3 mL via RESPIRATORY_TRACT
  Filled 2017-06-09: qty 3

## 2017-06-09 NOTE — Discharge Instructions (Signed)
Continue to use your inhaler - two puffs, every four hours as needed. Return to the ED if symptoms are getting worse.

## 2017-06-09 NOTE — ED Triage Notes (Signed)
Pt with left sided chest tightness x 2 weeks left arm pain x 2-3 days. Woke this am with nausea

## 2017-06-09 NOTE — ED Provider Notes (Signed)
[facility] Provider Note   CSN: [id number] Arrival date & time: 06/09/17  7510     History   Chief Complaint Chief Complaint  Patient presents with  . Chest Pain    HPI Sue [name] is a 46 y.o. female.  The history is provided by the patient.   She has been having tightness in her chest for the past 2 weeks. This is been associated with some dull, aching pain which radiates into her left arm. Pain is rated at 3/10. Nothing makes symptoms better, nothing makes them worse. Tonight, she is awakened with difficulty breathing and pain. She has tried using her albuterol inhaler, but has not obtained any relief. There has been a slight cough which is nonproductive. She denies fever, chills, sweats. There has been dyspnea and mild nausea and slight diaphoresis. At no point in the last 2 weeks as she been symptom-free. She is a nonsmoker. There is no history of hypertension or hyperlipidemia. She does have history of gestational diabetes, but recent A1c was normal. There is no family history of premature coronary atherosclerosis. There is no history of recent surgery or travel. No history of prior DVT or pulmonary embolism. She does have Mirena ring for contraception-no other exogenous hormones. She does have history of anxiety disorder. She has tried to treat herself with meditation without relief.  Past Medical History:  Diagnosis Date  . Anxiety   . Asthma   . Basal cell carcinoma of skin of face 2007   Dr. Delman Cheadle    Patient Active Problem List   Diagnosis Date Noted  . Low back pain 08/16/2015  . Right ankle pain 09/13/2012  . Right leg pain 08/08/2012  . Cyst of left ovary 12/26/2011  . Muscle spasm of calf 05/22/2011  . Menses, irregular 09/17/2010  . FOOT PAIN, BILATERAL 06/24/2010  . NUMBNESS 06/24/2010  . ANXIETY DISORDER, GENERALIZED 07/30/2009  . DIABETES MELLITUS, GESTATIONAL, HX OF 07/30/2009  . ASTHMA 11/12/2008    Past Surgical History:  Procedure  Laterality Date  . MOHS SURGERY     removal of basal cell carcinoma    OB History    No data available       Home Medications    Prior to Admission medications   Medication Sig Start Date End Date Taking? Authorizing Provider  clonazePAM (KLONOPIN) 0.25 MG disintegrating tablet Take one tab by mouth one to three times daily as needed for anxiety 02/12/17   Hali Marry, MD  propranolol (INDERAL) 10 MG tablet TAKE 1 TABLET BY MOUTH TWICE DAILY AS NEEDED FOR PUBLIC SPEAKING 2/58/52   Hali Marry, MD  sertraline (ZOLOFT) 100 MG tablet Take 1 tablet (100 mg total) by mouth daily. 02/12/17   Hali Marry, MD  VENTOLIN HFA 108 (90 Base) MCG/ACT inhaler INHALE 2 PUFFS BY MOUTH INTO THE LUNGS EVERY 4 HOURS AS NEEDED FOR WHEEZING. 05/28/17   Hali Marry, MD    Family History Family History  Problem Relation Age of Onset  . Hypertension Mother   . Hyperlipidemia Father   . Hypertension Father   . Diabetes Maternal Grandfather   . Depression Sister     Social History Social History  Substance Use Topics  . Smoking status: Never Smoker  . Smokeless tobacco: Never Used  . Alcohol use Yes     Comment: couple drinks per week     Allergies   Patient has no known allergies.   Review of Systems Review of  Systems  All other systems reviewed and are negative.    Physical Exam Updated Vital Signs BP (!) 141/84 (BP Location: Right Arm)   Pulse 72   Temp 98.1 F (36.7 C) (Oral)   Resp 16   Ht 5' 6"  (1.676 m)   Wt 78 kg (172 lb)   LMP 05/19/2017   SpO2 99%   BMI 27.76 kg/m   Physical Exam  Nursing note and vitals reviewed.  46 year old female, resting comfortably and in no acute distress. Vital signs are significant for borderline hypertension. Oxygen saturation is 99%, which is normal. Head is normocephalic and atraumatic. PERRLA, EOMI. Oropharynx is clear. Neck is nontender and supple without adenopathy or JVD. Back is nontender and  there is no CVA tenderness. Lungs have slightly prolonged exhalation phase without overt rales, wheezes, or rhonchi. Mild wheezing is noted with forced exhalation. Chest is nontender. Heart has regular rate and rhythm without murmur. Abdomen is soft, flat, nontender without masses or hepatosplenomegaly and peristalsis is normoactive. Extremities have no cyanosis or edema, full range of motion is present. Skin is warm and dry without rash. Neurologic: Mental status is normal, cranial nerves are intact, there are no motor or sensory deficits.  ED Treatments / Results  Labs (all labs ordered are listed, but only abnormal results are displayed) Labs Reviewed  COMPREHENSIVE METABOLIC PANEL - Abnormal; Notable for the following:       Result Value   Glucose, Bld 103 (*)    All other components within normal limits  CBC WITH DIFFERENTIAL/PLATELET  TROPONIN I  PREGNANCY, URINE    EKG  EKG Interpretation  Date/Time:  Wednesday June 09 2017 04:22:20 EDT Ventricular Rate:  69 PR Interval:    QRS Duration: 75 QT Interval:  385 QTC Calculation: 413 R Axis:   57 Text Interpretation:  Sinus rhythm Normal ECG When compared with ECG of 01/06/2012, No significant change was found Confirmed by Delora Fuel ([id number]) on 06/09/2017 5:25:45 AM       Radiology Dg Chest 2 View  Result Date: 06/09/2017 CLINICAL DATA:  46 year old female with chest pain and shortness of breath. EXAM: CHEST  2 VIEW COMPARISON:  Chest radiograph dated 03/22/2015 FINDINGS: The heart size and mediastinal contours are within normal limits. Both lungs are clear. The visualized skeletal structures are unremarkable. IMPRESSION: No active cardiopulmonary disease. Electronically Signed   By: Anner Crete M.D.   On: 06/09/2017 05:18    Procedures Procedures (including critical care time)  Medications Ordered in ED Medications  ipratropium-albuterol (DUONEB) 0.5-2.5 (3) MG/3ML nebulizer solution 3 mL (3 mLs Nebulization  Given 06/09/17 0541)  predniSONE (DELTASONE) tablet 60 mg (60 mg Oral Given 06/09/17 0625)  ipratropium-albuterol (DUONEB) 0.5-2.5 (3) MG/3ML nebulizer solution 3 mL (3 mLs Nebulization Given 06/09/17 0622)     Initial Impression / Assessment and Plan / ED Course  I have reviewed the triage vital signs and the nursing notes.  Pertinent labs & imaging results that were available during my care of the patient were reviewed by me and considered in my medical decision making (see chart for details).  Chest discomfort which most likely is related to bronchospasm. She has no risk factors for coronary artery disease. Heart Score = 0, which puts her at very low risk for major adverse cardiac events in the next 30 days. She is PERC negative, Wells negative. However, she does have potential exogenous hormone from Mirena ring, so we'll screen with d-dimer. Old records are reviewed, and she  has no relevant past visits.She will be given a therapeutic trial of albuterol with ipratropium.  She noted improvement with nebulizer treatment. However, she was not back to baseline. On reexam, there was minimal wheeze with forced exhalation. She's given a second nebulizer treatment with further improvement. She's given initial dose of prednisone. D-dimer is normal. Symptoms seem to be related to bronchospasm. She is discharged with prescription for prednisone, and advised to continue using her albuterol inhaler as needed. Return precautions discussed.  Final Clinical Impressions(s) / ED Diagnoses   Final diagnoses:  Moderate persistent asthma with exacerbation    New Prescriptions New Prescriptions   PREDNISONE (DELTASONE) 50 MG TABLET    Take 1 tablet (50 mg total) by mouth daily.     Delora Fuel, MD 83/25/49 [phone]

## 2017-06-10 MED FILL — predniSONE 50 MG TABS: 50 | 5 days supply | Qty: 5 | Fill #0

## 2017-06-23 ENCOUNTER — Encounter: Payer: Self-pay | Admitting: Osteopathic Medicine

## 2017-06-23 ENCOUNTER — Ambulatory Visit (INDEPENDENT_AMBULATORY_CARE_PROVIDER_SITE_OTHER): Payer: 59 | Admitting: Osteopathic Medicine

## 2017-06-23 VITALS — BP 122/77 | HR 62 | Ht 66.0 in | Wt 177.0 lb

## 2017-06-23 DIAGNOSIS — M7710 Lateral epicondylitis, unspecified elbow: Secondary | ICD-10-CM | POA: Diagnosis not present

## 2017-06-23 DIAGNOSIS — J4540 Moderate persistent asthma, uncomplicated: Secondary | ICD-10-CM | POA: Diagnosis not present

## 2017-06-23 MED ORDER — SPACER/AERO CHAMBER MOUTHPIECE MISC: 1 refills | Status: DC

## 2017-06-23 MED ORDER — FLUTICASONE-SALMETEROL 250-50 MCG/DOSE IN AEPB: 1.0000 | INHALATION_SPRAY | Freq: Two times a day (BID) | RESPIRATORY_TRACT | 1 refills | Status: DC

## 2017-06-23 MED FILL — AEROCHAMBER: 20 days supply | Qty: 1 | Fill #0

## 2017-06-23 MED FILL — ADVAIR 250/50 DISKUS: 250-50 | 30 days supply | Qty: 60 | Fill #0

## 2017-06-23 NOTE — Patient Instructions (Signed)
Plan:  Escalate inhaled medication therapy: Advair  Continue use of as-needed rescue inhaler: Albuterol, with spacer  Follow-up with PCP - if doing well, continue versus deescalate therapy, but if no better will probably need to get additional testing

## 2017-06-23 NOTE — Progress Notes (Signed)
HPI: Sue [name] is a 46 y.o. female  who presents to [facility] today, 06/23/17,  for chief complaint of:  Chief Complaint  Patient presents with  . Medication Management   Recent treatment in ER for asthma exacerbation, concerned might need change in inhalers. Using albuterol almost daily for the past few months, no known different triggers. Prior to the past 6 months, she was using her albuterol inhaler maybe once every couple of months. She does not use a spacer with it.  Also requesting referral to physical therapy for lateral epicondylitis, was diagnosed sometime ago but she never followed up with PT  Past medical history, surgical history, social history and family history reviewed.  Patient Active Problem List   Diagnosis Date Noted  . Low back pain 08/16/2015  . Right ankle pain 09/13/2012  . Right leg pain 08/08/2012  . Cyst of left ovary 12/26/2011  . Muscle spasm of calf 05/22/2011  . Menses, irregular 09/17/2010  . FOOT PAIN, BILATERAL 06/24/2010  . NUMBNESS 06/24/2010  . ANXIETY DISORDER, GENERALIZED 07/30/2009  . DIABETES MELLITUS, GESTATIONAL, HX OF 07/30/2009  . ASTHMA 11/12/2008    Current medication list and allergy/intolerance information reviewed.   Current Outpatient Prescriptions on File Prior to Visit  Medication Sig Dispense Refill  . clonazePAM (KLONOPIN) 0.25 MG disintegrating tablet Take one tab by mouth one to three times daily as needed for anxiety 10 tablet 0  . propranolol (INDERAL) 10 MG tablet TAKE 1 TABLET BY MOUTH TWICE DAILY AS NEEDED FOR PUBLIC SPEAKING 30 tablet 5  . sertraline (ZOLOFT) 100 MG tablet Take 1 tablet (100 mg total) by mouth daily. 90 tablet 1  . VENTOLIN HFA 108 (90 Base) MCG/ACT inhaler INHALE 2 PUFFS BY MOUTH INTO THE LUNGS EVERY 4 HOURS AS NEEDED FOR WHEEZING. 54 g 1   No current facility-administered medications on file prior to visit.    No Known Allergies    Review of  Systems:  Cardiac: No  chest pain, No  Pressure  HEENT: No sinus pain/pressure, no nasal drainage or sore throat  Respiratory:  No  shortness of breath. No  Cough a tthis time   Gastrointestinal: No  abdominal pain  Neurologic: No  weakness, No  Dizziness    Exam:  BP 122/77   Pulse 62   Ht 5' 6"  (1.676 m)   Wt 177 lb (80.3 kg)   SpO2 96%   BMI 28.57 kg/m   Constitutional: VS see above. General Appearance: alert, well-developed, well-nourished, NAD  Eyes: Normal lids and conjunctive, non-icteric sclera  Ears, Nose, Mouth, Throat: MMM, Normal external inspection ears/nares/mouth/lips/gums.  Neck: No masses, trachea midline.   Respiratory: Normal respiratory effort. no wheeze, no rhonchi, no rales  Cardiovascular: S1/S2 normal, no murmur, no rub/gallop auscultated. RRR.   Musculoskeletal: Gait normal. Symmetric and independent movement of all extremities  Neurological: Normal balance/coordination. No tremor.  Skin: warm, dry, intact.   Psychiatric: Normal judgment/insight. Normal mood and affect. Oriented x3.       ASSESSMENT/PLAN:   We will escalate asthma therapy to ICS/LABA given recent worsening severity of symptoms rather than just to ICS. Patient was educated on trigger identification and avoidance, use of spacer with rescue inhaler. May consider de-escalation to just ICS depending on how she does.   May consider discontinuation of beta blocker though she has been on this for some time without any exacerbations.  Moderate persistent asthma without complication - Plan: Fluticasone-Salmeterol (ADVAIR DISKUS) 250-50  MCG/DOSE AEPB, Spacer/Aero Chamber Mouthpiece MISC  Lateral epicondylitis, unspecified laterality - Plan: Ambulatory referral to Physical Therapy    Patient Instructions  Plan:  Escalate inhaled medication therapy: Advair  Continue use of as-needed rescue inhaler: Albuterol, with spacer  Follow-up with PCP - if doing well, continue versus  deescalate therapy, but if no better will probably need to get additional testing   Follow-up plan: Return in about 4 weeks (around 07/21/2017) for recheck asthma with Dr. Madilyn Fireman .  Visit summary with medication list and pertinent instructions was printed for patient to review, alert [place] if any changes needed. All questions at time of visit were answered - patient instructed to contact office with any additional concerns. ER/RTC precautions were reviewed with the patient and understanding verbalized.

## 2017-07-05 ENCOUNTER — Encounter: Payer: Self-pay | Admitting: Rehabilitative and Restorative Service Providers"

## 2017-07-05 ENCOUNTER — Ambulatory Visit: Payer: 59 | Attending: Osteopathic Medicine | Admitting: Rehabilitative and Restorative Service Providers"

## 2017-07-05 DIAGNOSIS — M79632 Pain in left forearm: Secondary | ICD-10-CM | POA: Insufficient documentation

## 2017-07-05 DIAGNOSIS — M6281 Muscle weakness (generalized): Secondary | ICD-10-CM | POA: Insufficient documentation

## 2017-07-05 DIAGNOSIS — M79631 Pain in right forearm: Secondary | ICD-10-CM | POA: Diagnosis not present

## 2017-07-05 NOTE — Therapy (Signed)
[facility] [address], [place], [place], [address] Phone: [phone]   Fax:  [phone]  Physical Therapy Evaluation  Patient Details  Name: Sue [name] MRN: [id number] Date of Birth: 09/21/1971 Referring Provider: Emeterio Reeve  Encounter Date: 07/05/2017      PT End of Session - 07/05/17 1304    Visit Number 1   Number of Visits 12   Date for PT Re-Evaluation 08/16/17   PT Start Time 1150   PT Stop Time 2376   PT Time Calculation (min) 52 min   Activity Tolerance Patient tolerated treatment well;No increased pain   Behavior During Therapy WFL for tasks assessed/performed      Past Medical History:  Diagnosis Date  . Anxiety   . Asthma   . Basal cell carcinoma of skin of face 2007   Dr. Delman Cheadle    Past Surgical History:  Procedure Laterality Date  . MOHS SURGERY     removal of basal cell carcinoma    There were no vitals filed for this visit.       Subjective Assessment - 07/05/17 1153    Subjective I had been having issues with my R tennis elbow but my L has flared up a lot. I am having issues picking things up and opening jars with my L. My stretches the MD gave me aren't helping.    Pertinent History R lateral epicondylitis x 1 year but pain is mild; pt sits at a computer all day. Issued wrist flex/ext stretches by MD. L shoulder occasionally pops.    Limitations Lifting;Writing;House hold activities   How long can you sit comfortably? no problem   How long can you stand comfortably? no problem   How long can you walk comfortably? no problem   Diagnostic tests no diagnostic testing performed   Patient Stated Goals to be able to do my activities without pain   Currently in Pain? Yes   Pain Score 4    Pain Location Elbow   Pain Orientation Right;Left  L > R   Pain Descriptors / Indicators Constant;Pressure;Sore;Tender  pt denies radiation   Pain Type Chronic pain   Pain Onset  More than a month ago   Pain Frequency Constant   Aggravating Factors  opening cans, lifting items, turning arm over   Pain Relieving Factors rest   Effect of Pain on Daily Activities all activities need modification   Multiple Pain Sites No            [facility] PT Assessment - 07/05/17 0001      Assessment   Medical Diagnosis lateral epicondylitis   Referring Provider Emeterio Reeve   Onset Date/Surgical Date --  couple of months   Hand Dominance Left   Next MD Visit 4-5 months   Prior Therapy none     Precautions   Precautions None     Restrictions   Weight Bearing Restrictions No     Balance Screen   Has the patient fallen in the past 6 months No     [place] residence     Prior Function   Level of Independence Independent     Cognition   Overall Cognitive Status Within Functional Limits for tasks assessed     Observation/Other Assessments   Focus on Therapeutic Outcomes (FOTO)  50% limitation     Observation/Other Assessments-Edema    Edema --  none     Coordination  Gross Motor Movements are Fluid and Coordinated Yes   Fine Motor Movements are Fluid and Coordinated No     Posture/Postural Control   Posture Comments no significant comments     ROM / Strength   AROM / PROM / Strength AROM;Strength     AROM   Overall AROM Comments shoulder AROM WFL bil but with discomfort with end ROM L elbow flexion at lateral epicondyle. AROM R wrist ext 50, flex 59, radial deviation 20, ulnar 29; L wrist ext 50, 25 radial, 30 ulnar, 25 flex.      Strength   Overall Strength Comments R wrist 4+/5 throughout, 4+/5 elbow flex, 4/5 tricep ext. L wrist 3+/5 throughout. L elbow flex 3+ and tricep 4+; dynamometer R 66 lbs and L 39 lbs     Palpation   Palpation comment Pt with tightness throughout L forearm with emphasis on bracioradialis, extensors, and anconeus. multiple trigger points palpated with occasional radiation reported;  decreased L ulnar mobility            Objective measurements completed on examination: See above findings.          [facility] Adult PT Treatment/Exercise - 07/05/17 0001      Modalities   Modalities Moist Heat     Moist Heat Therapy   Number Minutes Moist Heat 10 Minutes   Moist Heat Location --  L forearm in supine     Manual Therapy   Manual therapy comments soft tissue work/trigger point L bradioradialis and wrist extensors. pt able to tolerate firm pressure; half muscle tightness relieved after tx                PT Education - 07/05/17 1252    Education provided Yes   Education Details added to MD's HEP for supination AROM; heating directions to tight forearm musculature 10-15 min; discussed proper typing position at work   [employer]) Educated Patient   Methods Explanation;Demonstration;Verbal cues   Comprehension Verbalized understanding;Returned demonstration          PT Short Term Goals - 07/05/17 1258      PT SHORT TERM GOAL #1   Title Pt will be I with advanced HEP to assist with bil lateral epicondylitis pain to assist with work duties   Time 3   Period Weeks   Status New   Target Date 07/26/17     PT SHORT TERM GOAL #2   Title Pt will report L elbow pain to be reduced x 50% with functional activities   Time 3   Period Weeks   Status New   Target Date 07/26/17     PT SHORT TERM GOAL #3   Title Pt will be able to carry a dining room tray with bil elbow pain </= 3/10   Time 3   Period Weeks   Status New   Target Date 07/26/17     PT SHORT TERM GOAL #4   Title Pt will be able to write/type with 50% less difficulty   Time 3   Period Weeks   Status New   Target Date 07/26/17           PT Long Term Goals - 07/05/17 1300      PT LONG TERM GOAL #1   Title Pt will be able to drive with 20% less difficulty   Time 6   Period Weeks   Status New   Target Date 08/16/17     PT LONG TERM GOAL #2  Title Pt will be able to open jar  with L hand with 75% less difficulty   Time 6   Period Weeks   Status New   Target Date 08/16/17     PT LONG TERM GOAL #3   Title Pt will be able to wring out a towel with 3/10 bil elbow pain   Time 6   Period Weeks   Status New   Target Date 08/16/17     PT LONG TERM GOAL #4   Title Pt will be able to demo improved L wrist flexion to 50 degrees to assist with ADLs   Time 6   Period Weeks   Status New   Target Date 08/16/17     PT LONG TERM GOAL #5   Title Pt will demo improved L wrist strength to >/= 4/5 all directions (elbow flexion too) to assist with household chores   Time 6   Period Weeks   Status New   Target Date 08/16/17     Additional Long Term Goals   Additional Long Term Goals Yes     PT LONG TERM GOAL #6   Title Pt will be able to demo improved L dynamometer grip strength >/= 60 lbs to assist with lifting heavy items   Time 6   Period Weeks   Status New   Target Date 08/16/17                Plan - 07/05/17 1253    Clinical Impression Statement Pt presents to PT with bil lateral epicondylitis L > R with L being more acute and constant in nature. Pt demonstrates tightness all along L wrist extensors and brachioradialis. L arm supination is limited with pain at lateral epicondyle. Pt would benefit from PT for modalities, manual therapy, therex, dry needling, education to decrease bil lateral epicondylitis L > R.    Clinical Presentation Stable   Clinical Decision Making Low   Rehab Potential Excellent   PT Frequency 2x / week   PT Duration 6 weeks   PT Treatment/Interventions ADLs/Self Care Home Management;Cryotherapy;Electrical Stimulation;Iontophoresis 86m/ml Dexamethasone;Moist Heat;Ultrasound;Therapeutic exercise;Therapeutic activities;Functional mobility training;Patient/family education;Manual techniques;Passive range of motion;Dry needling;Taping   PT Next Visit Plan issue more advanced HEP for wrist extensor stretching; review supination  exercise issued at eval; [place] manual therapy to L wrist extensors   PT Home Exercise Plan see pt education   Consulted and Agree with Plan of Care Patient      Patient will benefit from skilled therapeutic intervention in order to improve the following deficits and impairments:  Decreased activity tolerance, Decreased coordination, Decreased range of motion, Decreased mobility, Decreased strength, Impaired flexibility, Increased muscle spasms, Hypomobility, Impaired UE functional use, Improper body mechanics, Pain  Visit Diagnosis: Pain in left forearm - Plan: PT plan of care cert/re-cert  Pain in right forearm - Plan: PT plan of care cert/re-cert  Muscle weakness (generalized) - Plan: PT plan of care cert/re-cert     Problem List Patient Active Problem List   Diagnosis Date Noted  . Low back pain 08/16/2015  . Right ankle pain 09/13/2012  . Right leg pain 08/08/2012  . Cyst of left ovary 12/26/2011  . Muscle spasm of calf 05/22/2011  . Menses, irregular 09/17/2010  . FOOT PAIN, BILATERAL 06/24/2010  . NUMBNESS 06/24/2010  . ANXIETY DISORDER, GENERALIZED 07/30/2009  . DIABETES MELLITUS, GESTATIONAL, HX OF 07/30/2009  . ASTHMA 11/12/2008    ARTIS,Darci Lykins, PT 07/05/2017, 1:10 PM  [facility]  [place] [address], [place], [place], [address] Phone: [phone]   Fax:  [phone]  Name: Sue [name] MRN: [id number] Date of Birth: 02/03/1971

## 2017-07-09 ENCOUNTER — Encounter: Payer: Self-pay | Admitting: Physical Therapy

## 2017-07-09 ENCOUNTER — Ambulatory Visit: Payer: 59 | Admitting: Physical Therapy

## 2017-07-09 DIAGNOSIS — M79632 Pain in left forearm: Secondary | ICD-10-CM

## 2017-07-09 DIAGNOSIS — M79631 Pain in right forearm: Secondary | ICD-10-CM

## 2017-07-09 DIAGNOSIS — M6281 Muscle weakness (generalized): Secondary | ICD-10-CM | POA: Diagnosis not present

## 2017-07-09 NOTE — Patient Instructions (Signed)

## 2017-07-09 NOTE — Therapy (Signed)
[facility] [address] [place], [place], [address] Phone: [phone]   Fax:  [phone]  Physical Therapy Treatment  Patient Details  Name: Sue [name] MRN: [id number] Date of Birth: 1971-11-12 Referring Provider: Emeterio Reeve  Encounter Date: 07/09/2017      PT End of Session - 07/09/17 0840    Visit Number 2   Number of Visits 12   Date for PT Re-Evaluation 08/16/17   Authorization Type UMR   PT Start Time 0807  due to secretary checking late   PT Stop Time 0820  ended early due to passing out   PT Time Calculation (min) 13 min   Activity Tolerance Other (comment)  passed out and called EMS   Behavior During Therapy --  passed out      Past Medical History:  Diagnosis Date  . Anxiety   . Asthma   . Basal cell carcinoma of skin of face 2007   Dr. Delman Cheadle    Past Surgical History:  Procedure Laterality Date  . MOHS SURGERY     removal of basal cell carcinoma    There were no vitals filed for this visit.      Subjective Assessment - 07/09/17 0811    Subjective I have alot of pain.    Pertinent History R lateral epicondylitis x 1 year but pain is mild; pt sits at a computer all day. Issued wrist flex/ext stretches by MD. L shoulder occasionally pops.    Limitations Lifting;Writing;House hold activities   How long can you sit comfortably? no problem   How long can you stand comfortably? no problem   How long can you walk comfortably? no problem   Diagnostic tests no diagnostic testing performed   Patient Stated Goals to be able to do my activities without pain   Currently in Pain? Yes   Pain Location Elbow   Pain Orientation Left   Pain Descriptors / Indicators Constant;Pressure;Sore;Tender   Pain Type Chronic pain   Pain Onset More than a month ago   Pain Frequency Constant   Aggravating Factors  opening cans, lifitng items, turning arm over   Pain Relieving Factors rest   Effect of Pain on Daily Activities all activities need modifaction   Multiple Pain Sites No            [facility] PT Assessment - 07/09/17 0001      Precautions   Precautions Other (comment)   Precaution Comments NO DRY NEEDLING due to passing out     Strength   Overall Strength Comments left shoulder flexion and abduction 4/5                       Trigger Point Dry Needling - 07/09/17 0813    Consent Given? Yes   Education Handout Provided Yes   Muscles Treated Upper Body --  left brachioradialis 2 times prior to passing out              PT Education - 07/09/17 0837    Education provided Yes   Education Details information on dry needling   Person(s) Educated Patient   Methods Explanation   Comprehension Verbalized understanding          PT Short Term Goals - 07/05/17 1258      PT SHORT TERM GOAL #1   Title Pt will be I with advanced HEP to assist with bil lateral epicondylitis pain to assist with work duties  Time 3   Period Weeks   Status New   Target Date 07/26/17     PT SHORT TERM GOAL #2   Title Pt will report L elbow pain to be reduced x 50% with functional activities   Time 3   Period Weeks   Status New   Target Date 07/26/17     PT SHORT TERM GOAL #3   Title Pt will be able to carry a dining room tray with bil elbow pain </= 3/10   Time 3   Period Weeks   Status New   Target Date 07/26/17     PT SHORT TERM GOAL #4   Title Pt will be able to write/type with 50% less difficulty   Time 3   Period Weeks   Status New   Target Date 07/26/17           PT Long Term Goals - 07/05/17 1300      PT LONG TERM GOAL #1   Title Pt will be able to drive with 95% less difficulty   Time 6   Period Weeks   Status New   Target Date 08/16/17     PT LONG TERM GOAL #2   Title Pt will be able to open jar with L hand with 75% less difficulty   Time 6   Period Weeks   Status New   Target Date 08/16/17     PT LONG TERM GOAL #3    Title Pt will be able to wring out a towel with 3/10 bil elbow pain   Time 6   Period Weeks   Status New   Target Date 08/16/17     PT LONG TERM GOAL #4   Title Pt will be able to demo improved L wrist flexion to 50 degrees to assist with ADLs   Time 6   Period Weeks   Status New   Target Date 08/16/17     PT LONG TERM GOAL #5   Title Pt will demo improved L wrist strength to >/= 4/5 all directions (elbow flexion too) to assist with household chores   Time 6   Period Weeks   Status New   Target Date 08/16/17     Additional Long Term Goals   Additional Long Term Goals Yes     PT LONG TERM GOAL #6   Title Pt will be able to demo improved L dynamometer grip strength >/= 60 lbs to assist with lifting heavy items   Time 6   Period Weeks   Status New   Target Date 08/16/17               Plan - 07/09/17 2841    Clinical Impression Statement Patient did not respond well to dry needling.  Therapist did dry needling 2 times into the left brachioradialis when the patient reported she felt faint and passed out while siting in a chair.  Therapist had EMS come to examine the patient.  She was released by EMS.  Patient stayed till her husband came to pick her up.  Therapy will resume with at the next visit with no dry needling.  Patient will benefit from skilled therapy to reduce pain and improve mobility of left elbow.    Rehab Potential Excellent   Clinical Impairments Affecting Rehab Potential NO DRY NEEDLIMG   PT Frequency 2x / week   PT Duration 6 weeks   PT Treatment/Interventions ADLs/Self Care Home Management;Cryotherapy;Electrical Stimulation;Iontophoresis 4m/ml Dexamethasone;Moist Heat;Ultrasound;Therapeutic exercise;Therapeutic  activities;Functional mobility training;Patient/family education;Manual techniques;Passive range of motion;Taping   PT Next Visit Plan Ultrasound, soft tissue work to left shoulder, left arm,   go gently due to reaction from last visit   PT Home  Exercise Plan progress as needed   Consulted and Agree with Plan of Care Patient      Patient will benefit from skilled therapeutic intervention in order to improve the following deficits and impairments:  Decreased activity tolerance, Decreased coordination, Decreased range of motion, Decreased mobility, Decreased strength, Impaired flexibility, Increased muscle spasms, Hypomobility, Impaired UE functional use, Improper body mechanics, Pain  Visit Diagnosis: Pain in left forearm  Pain in right forearm  Muscle weakness (generalized)     Problem List Patient Active Problem List   Diagnosis Date Noted  . Low back pain 08/16/2015  . Right ankle pain 09/13/2012  . Right leg pain 08/08/2012  . Cyst of left ovary 12/26/2011  . Muscle spasm of calf 05/22/2011  . Menses, irregular 09/17/2010  . FOOT PAIN, BILATERAL 06/24/2010  . NUMBNESS 06/24/2010  . ANXIETY DISORDER, GENERALIZED 07/30/2009  . DIABETES MELLITUS, GESTATIONAL, HX OF 07/30/2009  . ASTHMA 11/12/2008    Earlie Counts, PT 07/09/17 10:24 AM   [facility] [address] [place], [place], [address] Phone: [phone]   Fax:  [phone]  Name: Sue [name] MRN: [id number] Date of Birth: May 19, 1971

## 2017-07-10 DIAGNOSIS — R55 Syncope and collapse: Secondary | ICD-10-CM | POA: Diagnosis not present

## 2017-07-13 ENCOUNTER — Ambulatory Visit: Payer: 59 | Admitting: Physical Therapy

## 2017-07-13 DIAGNOSIS — M79631 Pain in right forearm: Secondary | ICD-10-CM | POA: Diagnosis not present

## 2017-07-13 DIAGNOSIS — M79632 Pain in left forearm: Secondary | ICD-10-CM | POA: Diagnosis not present

## 2017-07-13 DIAGNOSIS — M6281 Muscle weakness (generalized): Secondary | ICD-10-CM | POA: Diagnosis not present

## 2017-07-13 NOTE — Therapy (Signed)
[facility] [address], [place], [place], [address] Phone: [phone]   Fax:  [phone]  Physical Therapy Treatment  Patient Details  Name: Sue [name] MRN: [id number] Date of Birth: Aug 05, 1971 Referring Provider: Emeterio Reeve  Encounter Date: 07/13/2017      PT End of Session - 07/13/17 0822    Visit Number 3   Number of Visits 12   Date for PT Re-Evaluation 08/16/17   Authorization Type UMR   PT Start Time 0723   PT Stop Time 0815   PT Time Calculation (min) 52 min   Activity Tolerance Patient tolerated treatment well      Past Medical History:  Diagnosis Date  . Anxiety   . Asthma   . Basal cell carcinoma of skin of face 2007   Dr. Delman Cheadle    Past Surgical History:  Procedure Laterality Date  . MOHS SURGERY     removal of basal cell carcinoma    There were no vitals filed for this visit.      Subjective Assessment - 07/13/17 0724    Subjective I think the fear of needles made me pass out last time.  Went home to rest.  Hurts to pick up coffee cup.  I think it may be getting worse.  Left > right at present.  Left hand dominant.  Use right and to use computer mouse.  Bending elbow painful.  I    Currently in Pain? Yes   Pain Score 6    Pain Location Elbow   Pain Orientation Left   Pain Type Chronic pain   Aggravating Factors  gripping, texting, straightening elbow; night   Pain Relieving Factors rest                         [facility] Adult PT Treatment/Exercise - 07/13/17 0001      Self-Care   Self-Care RICE;Heat/Ice Application;Other Self-Care Comments   Heat/Ice Application ice cup massage   Other Self-Care Comments  tennis elbow brace (viewed options online) and discussed wear time     Cryotherapy   Number Minutes Cryotherapy 10 Minutes   Cryotherapy Location Forearm   Type of Cryotherapy Ice pack     Ultrasound   Ultrasound Location left foream extensor  muscle wad   Ultrasound Parameters .8w./ cm2 8 min 100%   Ultrasound Goals Pain     Manual Therapy   Manual Therapy Soft tissue mobilization   Soft tissue mobilization left extensor carpi radialis brevis, triceps, bicepts                  PT Short Term Goals - 07/13/17 1529      PT SHORT TERM GOAL #1   Title Pt will be I with advanced HEP to assist with bil lateral epicondylitis pain to assist with work duties   Time 3   Period Weeks   Status On-going     PT SHORT TERM GOAL #2   Title Pt will report L elbow pain to be reduced x 50% with functional activities   Time 3   Period Weeks   Status On-going     PT SHORT TERM GOAL #3   Title Pt will be able to carry a dining room tray with bil elbow pain </= 3/10   Time 3   Period Weeks   Status On-going     PT SHORT TERM GOAL #4   Title Pt will be  able to write/type with 50% less difficulty   Time 3   Period Weeks   Status On-going           PT Long Term Goals - 07/13/17 1529      PT LONG TERM GOAL #1   Title Pt will be able to drive with 97% less difficulty   Time 6   Period Weeks   Status On-going     PT LONG TERM GOAL #2   Title Pt will be able to open jar with L hand with 75% less difficulty   Time 6   Period Weeks   Status On-going     PT LONG TERM GOAL #3   Title Pt will be able to wring out a towel with 3/10 bil elbow pain   Time 6   Period Weeks   Status On-going     PT LONG TERM GOAL #4   Title Pt will be able to demo improved L wrist flexion to 50 degrees to assist with ADLs   Time 6   Period Weeks   Status On-going     PT LONG TERM GOAL #5   Title Pt will demo improved L wrist strength to >/= 4/5 all directions (elbow flexion too) to assist with household chores   Time 6   Period Weeks   Status On-going     PT LONG TERM GOAL #6   Title Pt will be able to demo improved L dynamometer grip strength >/= 60 lbs to assist with lifting heavy items   Time 6   Period Weeks   Status  On-going               Plan - 07/13/17 1142    Clinical Impression Statement Recommend patient purchase tennis elbow brace for day time use with ADLS.   Tender points in left extensor carpi radialis and brevis muscles.  Improved soft tissue mobility following ultrasound and manual therapy.  Also discussed home self care strategies including ice cup massage and sleeping positions.     Rehab Potential Excellent   Clinical Impairments Affecting Rehab Potential NO DRY NEEDLING   PT Frequency 2x / week   PT Duration 6 weeks   PT Treatment/Interventions ADLs/Self Care Home Management;Cryotherapy;Electrical Stimulation;Iontophoresis 31m/ml Dexamethasone;Moist Heat;Ultrasound;Therapeutic exercise;Therapeutic activities;Functional mobility training;Patient/family education;Manual techniques;Passive range of motion;Taping   PT Next Visit Plan Ultrasound, soft tissue work to left shoulder, left arm, ionto if cert signed;  see if patient got tennis elbow brace   Recommended Other Services 2nd attempt sent to MD for cert signature      Patient will benefit from skilled therapeutic intervention in order to improve the following deficits and impairments:  Decreased activity tolerance, Decreased coordination, Decreased range of motion, Decreased mobility, Decreased strength, Impaired flexibility, Increased muscle spasms, Hypomobility, Impaired UE functional use, Improper body mechanics, Pain  Visit Diagnosis: Pain in left forearm  Muscle weakness (generalized)     Problem List Patient Active Problem List   Diagnosis Date Noted  . Low back pain 08/16/2015  . Right ankle pain 09/13/2012  . Right leg pain 08/08/2012  . Cyst of left ovary 12/26/2011  . Muscle spasm of calf 05/22/2011  . Menses, irregular 09/17/2010  . FOOT PAIN, BILATERAL 06/24/2010  . NUMBNESS 06/24/2010  . ANXIETY DISORDER, GENERALIZED 07/30/2009  . DIABETES MELLITUS, GESTATIONAL, HX OF 07/30/2009  . ASTHMA 11/12/2008    SRuben Im PT 07/13/17 4:05 PM Phone: [phone]: [phone] SAlvera Singh8/21/2018, 4:05 PM  [facility] [address] [place], [place], [address] Phone: [phone]   Fax:  [phone]  Name: Sue [name] MRN: [id number] Date of Birth: Sep 09, 1971

## 2017-07-15 ENCOUNTER — Ambulatory Visit: Payer: 59

## 2017-07-15 DIAGNOSIS — M79632 Pain in left forearm: Secondary | ICD-10-CM

## 2017-07-15 DIAGNOSIS — M6281 Muscle weakness (generalized): Secondary | ICD-10-CM | POA: Diagnosis not present

## 2017-07-15 DIAGNOSIS — M79631 Pain in right forearm: Secondary | ICD-10-CM

## 2017-07-15 NOTE — Therapy (Signed)
[facility] [address] [place], [place], [address] Phone: [phone]   Fax:  [phone]  Physical Therapy Treatment  Patient Details  Name: Sue [name] MRN: [id number] Date of Birth: 1971-07-31 Referring Provider: Emeterio Reeve  Encounter Date: 07/15/2017      PT End of Session - 07/15/17 1624    Visit Number 4   Number of Visits 12   Date for PT Re-Evaluation 08/16/17   Authorization Type UMR   PT Start Time 1619   PT Stop Time 1700   PT Time Calculation (min) 41 min   Activity Tolerance Patient tolerated treatment well      Past Medical History:  Diagnosis Date  . Anxiety   . Asthma   . Basal cell carcinoma of skin of face 2007   Dr. Delman Cheadle    Past Surgical History:  Procedure Laterality Date  . MOHS SURGERY     removal of basal cell carcinoma    There were no vitals filed for this visit.      Subjective Assessment - 07/15/17 1621    Subjective Pt. noting L elbow hurting worse today with constant ache.     Patient Stated Goals to be able to do my activities without pain   Currently in Pain? Yes   Pain Score 4    Pain Location Elbow   Pain Orientation Left   Pain Descriptors / Indicators Constant;Tender   Pain Type Chronic pain   Pain Onset More than a month ago   Pain Frequency Constant   Aggravating Factors  Gripping, texting    Multiple Pain Sites Yes   Pain Score --  .5    Pain Location Elbow   Pain Orientation Right   Aggravating Factors  gripping                          [facility] Adult PT Treatment/Exercise - 07/15/17 1709      Self-Care   Self-Care --   Other Self-Care Comments  Discussion of when to wear tennis elbow brace and brace type; Discussion of need for ice and use of "paper cup ice at home"      Exercises   Exercises Wrist     Wrist Exercises   Other wrist exercises B wrist extensor stretch 3 x 30 sec; cues to avoid painful arc     Modalities   Modalities Iontophoresis     Ultrasound   Ultrasound Location Left forearm extensor muscle wad    Ultrasound Parameters .8/watts/cm2, 100%, 8 min    Ultrasound Goals Pain     Iontophoresis   Type of Iontophoresis Dexamethasone   Location L lateral epicondyle    Dose 31m/min, 1.019m   Time 4-6 wear time     Manual Therapy   Manual Therapy Soft tissue mobilization   Soft tissue mobilization left extensor carpi radialis brevis                PT Education - 07/15/17 1708    Education provided Yes   Education Details iontophoresis educational handout   Person(s) Educated Patient   Methods Verbal cues;Handout;Explanation   Comprehension Verbalized understanding;Verbal cues required;Need further instruction          PT Short Term Goals - 07/13/17 1529      PT SHORT TERM GOAL #1   Title Pt will be I with advanced HEP to assist with bil lateral epicondylitis pain to assist  with work duties   Time 3   Period Weeks   Status On-going     PT SHORT TERM GOAL #2   Title Pt will report L elbow pain to be reduced x 50% with functional activities   Time 3   Period Weeks   Status On-going     PT SHORT TERM GOAL #3   Title Pt will be able to carry a dining room tray with bil elbow pain </= 3/10   Time 3   Period Weeks   Status On-going     PT SHORT TERM GOAL #4   Title Pt will be able to write/type with 50% less difficulty   Time 3   Period Weeks   Status On-going           PT Long Term Goals - 07/13/17 1529      PT LONG TERM GOAL #1   Title Pt will be able to drive with 82% less difficulty   Time 6   Period Weeks   Status On-going     PT LONG TERM GOAL #2   Title Pt will be able to open jar with L hand with 75% less difficulty   Time 6   Period Weeks   Status On-going     PT LONG TERM GOAL #3   Title Pt will be able to wring out a towel with 3/10 bil elbow pain   Time 6   Period Weeks   Status On-going     PT LONG TERM GOAL #4    Title Pt will be able to demo improved L wrist flexion to 50 degrees to assist with ADLs   Time 6   Period Weeks   Status On-going     PT LONG TERM GOAL #5   Title Pt will demo improved L wrist strength to >/= 4/5 all directions (elbow flexion too) to assist with household chores   Time 6   Period Weeks   Status On-going     PT LONG TERM GOAL #6   Title Pt will be able to demo improved L dynamometer grip strength >/= 60 lbs to assist with lifting heavy items   Time 6   Period Weeks   Status On-going               Plan - 07/15/17 1626    Clinical Impression Statement Pt. doing well today however noting constant ache L lateral elbow today.  R elbow only bothering her with certain movements.  STM/strumming to extensor bundle today with pt. tolerating well.  Discussion of need for regular ice with paper cup at home.  Pt. has purchased tennis elbow brace and is wearing during day with some relief.  Pt. noting she feels ultrasound helped thus ultrasound to L elbow today.  Ionto signed by MD thus iontophoresis initiated to L elbow to end treatment.  Iontophoresis educational handout including info for wear time and contraindications/ precautions issued to pt. with pt. verbalizing understanding.  Will monitor response to ionto in coming visits.   PT Treatment/Interventions ADLs/Self Care Home Management;Cryotherapy;Electrical Stimulation;Iontophoresis 63m/ml Dexamethasone;Moist Heat;Ultrasound;Therapeutic exercise;Therapeutic activities;Functional mobility training;Patient/family education;Manual techniques;Passive range of motion;Taping   PT Next Visit Plan Response to ionto; Ultrasound, soft tissue work to left shoulder, left arm      Patient will benefit from skilled therapeutic intervention in order to improve the following deficits and impairments:  Decreased activity tolerance, Decreased coordination, Decreased range of motion, Decreased mobility, Decreased strength, Impaired  flexibility, Increased  muscle spasms, Hypomobility, Impaired UE functional use, Improper body mechanics, Pain  Visit Diagnosis: Pain in left forearm  Muscle weakness (generalized)  Pain in right forearm     Problem List Patient Active Problem List   Diagnosis Date Noted  . Low back pain 08/16/2015  . Right ankle pain 09/13/2012  . Right leg pain 08/08/2012  . Cyst of left ovary 12/26/2011  . Muscle spasm of calf 05/22/2011  . Menses, irregular 09/17/2010  . FOOT PAIN, BILATERAL 06/24/2010  . NUMBNESS 06/24/2010  . ANXIETY DISORDER, GENERALIZED 07/30/2009  . DIABETES MELLITUS, GESTATIONAL, HX OF 07/30/2009  . ASTHMA 11/12/2008   Bess Harvest, PTA 07/15/17 5:21 PM  [facility] [address] [place], [place], [address] Phone: [phone]   Fax:  [phone]  Name: Sue [name] MRN: [id number] Date of Birth: 07-24-1971

## 2017-07-15 NOTE — Patient Instructions (Signed)

## 2017-07-20 ENCOUNTER — Ambulatory Visit: Payer: 59 | Admitting: Physical Therapy

## 2017-07-20 ENCOUNTER — Encounter: Payer: Self-pay | Admitting: Physical Therapy

## 2017-07-20 DIAGNOSIS — M79632 Pain in left forearm: Secondary | ICD-10-CM

## 2017-07-20 DIAGNOSIS — M6281 Muscle weakness (generalized): Secondary | ICD-10-CM

## 2017-07-20 DIAGNOSIS — M79631 Pain in right forearm: Secondary | ICD-10-CM

## 2017-07-20 NOTE — Therapy (Signed)
[facility] [address], [place], [place], [address] Phone: [phone]   Fax:  [phone]  Physical Therapy Treatment  Patient Details  Name: Sue [name] MRN: [id number] Date of Birth: 1971-10-03 Referring Provider: Dr. Emeterio Reeve  Encounter Date: 07/20/2017      PT End of Session - 07/20/17 1156    Visit Number 5   Number of Visits 12   Date for PT Re-Evaluation 08/16/17   Authorization Type UMR   PT Start Time 1145   PT Stop Time 1225   PT Time Calculation (min) 40 min   Activity Tolerance Patient tolerated treatment well   Behavior During Therapy [facility] for tasks assessed/performed      Past Medical History:  Diagnosis Date  . Anxiety   . Asthma   . Basal cell carcinoma of skin of face 2007   Dr. Delman Cheadle    Past Surgical History:  Procedure Laterality Date  . MOHS SURGERY     removal of basal cell carcinoma    There were no vitals filed for this visit.      Subjective Assessment - 07/20/17 1150    Subjective My elbow is feeling the same.  It is not doing better. I feel better icy hot sleeve and tennis elbow brace help. Pain with brushing her teeth and picking up coffee cup.     Pertinent History R lateral epicondylitis x 1 year but pain is mild; pt sits at a computer all day. Issued wrist flex/ext stretches by MD. L shoulder occasionally pops.    Limitations Lifting;Writing;House hold activities   How long can you sit comfortably? no problem   How long can you stand comfortably? no problem   How long can you walk comfortably? no problem   Diagnostic tests no diagnostic testing performed   Patient Stated Goals to be able to do my activities without pain   Currently in Pain? Yes   Pain Score 5    Pain Location Elbow   Pain Orientation Left   Pain Descriptors / Indicators Tender;Constant   Pain Type Chronic pain   Pain Onset More than a month ago   Pain Frequency Constant   Aggravating Factors  gripping, texting   Pain Relieving Factors rest   Effect of Pain on Daily Activities all activities need modification   Multiple Pain Sites No            [facility] PT Assessment - 07/20/17 0001      Assessment   Medical Diagnosis lateral epicondylitis   Referring Provider Dr. Emeterio Reeve   Onset Date/Surgical Date --  couple of months   Hand Dominance Left   Next MD Visit 4-5 months   Prior Therapy none     Precautions   Precautions Other (comment)   Precaution Comments NO DRY NEEDLING due to passing out     Restrictions   Weight Bearing Restrictions No     Balance Screen   Has the patient fallen in the past 6 months No     [place] residence     Prior Function   Level of Independence Independent     Cognition   Overall Cognitive Status Within Functional Limits for tasks assessed     Strength   Overall Strength Comments left shoulder flexion and abduction 4/5                     [facility] Adult PT  Treatment/Exercise - 07/20/17 0001      Modalities   Modalities Ultrasound;Iontophoresis     Ultrasound   Ultrasound Location left lateral forearm   Ultrasound Parameters 1.2 w/cm2, 61mz, 8 min, 100%   Ultrasound Goals Pain     Iontophoresis   Type of Iontophoresis Dexamethasone   Location L lateral epicondyle    Dose 8463mmin, 1.63m72m lot #[id number] 4-6 wear time  #2     Manual Therapy   Manual Therapy Soft tissue mobilization   Manual therapy comments transverse friction massage to the lateral epicondyle   Soft tissue mobilization left extensor carpi radialis brevis; brachioradialis                  PT Short Term Goals - 07/20/17 1154      PT SHORT TERM GOAL #1   Title Pt will be I with advanced HEP to assist with bil lateral epicondylitis pain to assist with work duties   Time 3   Period Weeks   Status Achieved     PT SHORT TERM GOAL #2   Title Pt will report L  elbow pain to be reduced x 50% with functional activities   Period Weeks   Status On-going  no change in pain     PT SHORT TERM GOAL #3   Title Pt will be able to carry a dining room tray with bil elbow pain </= 3/10   Time 3   Period Weeks   Status On-going  pain level 8/10     PT SHORT TERM GOAL #4   Title Pt will be able to write/type with 50% less difficulty   Time 3   Period Weeks   Status On-going  no change           PT Long Term Goals - 07/13/17 1529      PT LONG TERM GOAL #1   Title Pt will be able to drive with 75%43%ss difficulty   Time 6   Period Weeks   Status On-going     PT LONG TERM GOAL #2   Title Pt will be able to open jar with L hand with 75% less difficulty   Time 6   Period Weeks   Status On-going     PT LONG TERM GOAL #3   Title Pt will be able to wring out a towel with 3/10 bil elbow pain   Time 6   Period Weeks   Status On-going     PT LONG TERM GOAL #4   Title Pt will be able to demo improved L wrist flexion to 50 degrees to assist with ADLs   Time 6   Period Weeks   Status On-going     PT LONG TERM GOAL #5   Title Pt will demo improved L wrist strength to >/= 4/5 all directions (elbow flexion too) to assist with household chores   Time 6   Period Weeks   Status On-going     PT LONG TERM GOAL #6   Title Pt will be able to demo improved L dynamometer grip strength >/= 60 lbs to assist with lifting heavy items   Time 6   Period Weeks   Status On-going               Plan - 07/20/17 1230    Clinical Impression Statement After therapy pain decreased to 3/10.  Patient has increased thickness of the left brachioradialis and extensor carpi  radialis  brevis and longus. Patient is not ready for exercise due to pain.  Patient is wearing her brace.  Patient has not seen any changes in her pain.  Patient will benefit from skilled therapy to reduce pain  and improve strength to return to function.    Rehab Potential Excellent    Clinical Impairments Affecting Rehab Potential NO DRY NEEDLING   PT Frequency 2x / week   PT Duration 6 weeks   PT Treatment/Interventions ADLs/Self Care Home Management;Cryotherapy;Electrical Stimulation;Iontophoresis 10m/ml Dexamethasone;Moist Heat;Ultrasound;Therapeutic exercise;Therapeutic activities;Functional mobility training;Patient/family education;Manual techniques;Passive range of motion;Taping   PT Next Visit Plan  ionto #2; Ultrasound, soft tissue work to left shoulder, left arm and forearm   PT Home Exercise Plan progress as needed   Consulted and Agree with Plan of Care Patient      Patient will benefit from skilled therapeutic intervention in order to improve the following deficits and impairments:  Decreased activity tolerance, Decreased coordination, Decreased range of motion, Decreased mobility, Decreased strength, Impaired flexibility, Increased muscle spasms, Hypomobility, Impaired UE functional use, Improper body mechanics, Pain  Visit Diagnosis: Pain in left forearm  Muscle weakness (generalized)  Pain in right forearm     Problem List Patient Active Problem List   Diagnosis Date Noted  . Low back pain 08/16/2015  . Right ankle pain 09/13/2012  . Right leg pain 08/08/2012  . Cyst of left ovary 12/26/2011  . Muscle spasm of calf 05/22/2011  . Menses, irregular 09/17/2010  . FOOT PAIN, BILATERAL 06/24/2010  . NUMBNESS 06/24/2010  . ANXIETY DISORDER, GENERALIZED 07/30/2009  . DIABETES MELLITUS, GESTATIONAL, HX OF 07/30/2009  . ASTHMA 11/12/2008    CEarlie Counts PT 07/20/17 12:34 PM   [facility] [address] [place] [address]Phone: [phone]  Fax:  [phone] Name: Sue AGNESMRN: [id number] of Birth: [birth year]

## 2017-07-22 ENCOUNTER — Ambulatory Visit: Payer: 59 | Admitting: Physical Therapy

## 2017-07-22 DIAGNOSIS — M6281 Muscle weakness (generalized): Secondary | ICD-10-CM | POA: Diagnosis not present

## 2017-07-22 DIAGNOSIS — M79632 Pain in left forearm: Secondary | ICD-10-CM | POA: Diagnosis not present

## 2017-07-22 DIAGNOSIS — M79631 Pain in right forearm: Secondary | ICD-10-CM | POA: Diagnosis not present

## 2017-07-22 NOTE — Therapy (Signed)
[facility] [address] [place], [place], [address] Phone: [phone]   Fax:  [phone]  Physical Therapy Treatment  Patient Details  Name: Sue [name] MRN: [id number] Date of Birth: 07/29/1971 Referring Provider: Dr. Emeterio Reeve  Encounter Date: 07/22/2017      PT End of Session - 07/22/17 0825    Visit Number 6   Number of Visits 12   Date for PT Re-Evaluation 08/16/17   Authorization Type UMR   PT Start Time 0730   PT Stop Time 0805  pt needs to get to work mtg   PT Time Calculation (min) 35 min   Activity Tolerance Patient tolerated treatment well      Past Medical History:  Diagnosis Date  . Anxiety   . Asthma   . Basal cell carcinoma of skin of face 2007   Dr. Delman Cheadle    Past Surgical History:  Procedure Laterality Date  . MOHS SURGERY     removal of basal cell carcinoma    There were no vitals filed for this visit.      Subjective Assessment - 07/22/17 0727    Subjective I'm feeling better since Tuesday.  Wearing icy hot sleeve with band.  It is getting better.  Left lateral epicondyle pain with carrying a coffee cup.     Currently in Pain? Yes   Pain Score 3    Pain Location Elbow   Pain Orientation Left   Pain Type Chronic pain                         [facility] Adult PT Treatment/Exercise - 07/22/17 0001      Self-Care   Other Self-Care Comments  review of home/work self care techniques and response      Ultrasound   Ultrasound Location left lateral forearm   Ultrasound Parameters 1.2W/cm2 37mz,100%   Ultrasound Goals Pain     Iontophoresis   Type of Iontophoresis Dexamethasone   Location L lateral epicondyle    Dose 833mmin, 1.43m65m lot [id number] 4-6 wear time  #3     Manual Therapy   Manual Therapy Myofascial release   Myofascial Release Graston G2 instrument to left forearm extensor muscle wad with sweeping, tendon strumming                    PT Short Term Goals - 07/22/17 [id number]   PT SHORT TERM GOAL #1   Title Pt will be I with advanced HEP to assist with bil lateral epicondylitis pain to assist with work duties   Status Achieved     PT SHORT TERM GOAL #2   Title Pt will report L elbow pain to be reduced x 50% with functional activities   Time 3   Period Weeks   Status On-going     PT SHORT TERM GOAL #3   Title Pt will be able to carry a dining room tray with bil elbow pain </= 3/10   Time 3   Period Weeks   Status On-going     PT SHORT TERM GOAL #4   Title Pt will be able to write/type with 50% less difficulty   Time 3   Period Weeks   Status On-going           PT Long Term Goals - 07/22/17 [id number]   PT LONG TERM GOAL #1  Title Pt will be able to drive with 50% less difficulty   Time 6   Period Weeks   Status On-going     PT LONG TERM GOAL #2   Title Pt will be able to open jar with L hand with 75% less difficulty   Time 6   Period Weeks   Status On-going     PT LONG TERM GOAL #3   Title Pt will be able to wring out a towel with 3/10 bil elbow pain   Time 6   Period Weeks   Status On-going     PT LONG TERM GOAL #4   Title Pt will be able to demo improved L wrist flexion to 50 degrees to assist with ADLs   Time 6   Period Weeks   Status On-going     PT LONG TERM GOAL #5   Title Pt will demo improved L wrist strength to >/= 4/5 all directions (elbow flexion too) to assist with household chores   Time 6   Period Weeks   Status On-going     PT LONG TERM GOAL #6   Title Pt will be able to demo improved L dynamometer grip strength >/= 60 lbs to assist with lifting heavy items   Time 6   Period Weeks   Status On-going               Plan - 07/22/17 0825    Clinical Impression Statement The patient reports she feels some improvement this week in left elbow pain.  She has good response to Graston myofascial work with improved soft tissue mobility noted  following.  Recommend continued pain relieving techniques including iontophoresis before returning to exercise.     Rehab Potential Excellent   Clinical Impairments Affecting Rehab Potential NO DRY NEEDLING   PT Frequency 2x / week   PT Duration 6 weeks   PT Treatment/Interventions ADLs/Self Care Home Management;Cryotherapy;Electrical Stimulation;Iontophoresis 26m/ml Dexamethasone;Moist Heat;Ultrasound;Therapeutic exercise;Therapeutic activities;Functional mobility training;Patient/family education;Manual techniques;Passive range of motion;Taping   PT Next Visit Plan  ionto #3; Ultrasound, soft tissue work to left shoulder, left arm and forearm;   assess response to Graston      Patient will benefit from skilled therapeutic intervention in order to improve the following deficits and impairments:  Decreased activity tolerance, Decreased coordination, Decreased range of motion, Decreased mobility, Decreased strength, Impaired flexibility, Increased muscle spasms, Hypomobility, Impaired UE functional use, Improper body mechanics, Pain  Visit Diagnosis: Pain in left forearm  Muscle weakness (generalized)  Pain in right forearm     Problem List Patient Active Problem List   Diagnosis Date Noted  . Low back pain 08/16/2015  . Right ankle pain 09/13/2012  . Right leg pain 08/08/2012  . Cyst of left ovary 12/26/2011  . Muscle spasm of calf 05/22/2011  . Menses, irregular 09/17/2010  . FOOT PAIN, BILATERAL 06/24/2010  . NUMBNESS 06/24/2010  . ANXIETY DISORDER, GENERALIZED 07/30/2009  . DIABETES MELLITUS, GESTATIONAL, HX OF 07/30/2009  . ASTHMA 11/12/2008   SRuben Im PT 07/22/17 8:32 AM Phone: [phone]: [phone] SAlvera Singh8/30/2018, 8:31 AM  [facility] [address] [place] [address]Phone: [phone]  Fax:  [phone] Name: Sue HARKINSMRN: [id number] of Birth:  108-14-1972

## 2017-07-23 ENCOUNTER — Encounter: Payer: Self-pay | Admitting: Family Medicine

## 2017-07-23 ENCOUNTER — Ambulatory Visit (INDEPENDENT_AMBULATORY_CARE_PROVIDER_SITE_OTHER): Payer: 59 | Admitting: Family Medicine

## 2017-07-23 VITALS — BP 138/71 | HR 75 | Wt 176.0 lb

## 2017-07-23 DIAGNOSIS — F411 Generalized anxiety disorder: Secondary | ICD-10-CM | POA: Diagnosis not present

## 2017-07-23 DIAGNOSIS — J4540 Moderate persistent asthma, uncomplicated: Secondary | ICD-10-CM | POA: Diagnosis not present

## 2017-07-23 DIAGNOSIS — R55 Syncope and collapse: Secondary | ICD-10-CM

## 2017-07-23 MED ORDER — SERTRALINE HCL 100 MG PO TABS: 150.0000 mg | ORAL_TABLET | Freq: Every day | ORAL | 1 refills | Status: DC

## 2017-07-23 MED ORDER — FLUTICASONE-SALMETEROL 250-50 MCG/DOSE IN AEPB: 1.0000 | INHALATION_SPRAY | Freq: Two times a day (BID) | RESPIRATORY_TRACT | 0 refills | Status: DC

## 2017-07-23 NOTE — Patient Instructions (Signed)
Continue Advair (combo medicine) for 1 more month. I did go ahead and send a refill today. When she was done with fat if you're doing well given [place] a call back and we will drop down the medication to just inhaled steroid and set of a combination medication.

## 2017-07-23 NOTE — Progress Notes (Signed)
Subjective:    Patient ID: Sue [name], female    DOB: Apr 15, 1971, 46 y.o.   MRN: [id number]  HPI  4 week f/u for asthma -  She was recently seen in the emergency department on July 18 as she woke up feeling short of breath and tight her chest. She went to the ED and she was actually having a asthma exacerbation. She said after she received the albuterol nebulizer she felt significantly better. At the time she didn't know if it was her asthma or stress levels because she has not had significant symptoms with her asthma in a very long time. She then followed up on August 1 with Dr. Sheppard Coil. She was started on Advair. She's actually been doing really well. The last time she used her rescue inhaler was about 2 weeks ago. She's not had any increase shortness of breath or wheezing or cough at that time.  She has been feeling stressed and overwhelmed. She's been a lot more tearful than usual. She was thinking about getting back in with her old therapist used to work over [facility] on [address] in [place]. She's currently taking her Zoloft and says she uses it regularly. She's noticed that she is feeling more anxious around her periods than usual. She does have a Mirena in.  Left lateral epicondylitis-she did start physical therapy. She's been wearing her brace. She was actually getting dry needling and passed out. The therapist wanted [place] to know about this. But she says she's always had problems with needles and has vasovagal during an injection previously.  Review of Systems  BP 138/71   Pulse 75   Wt 176 lb (79.8 kg)   SpO2 98%   BMI 28.41 kg/m     No Known Allergies  Past Medical History:  Diagnosis Date  . Anxiety   . Asthma   . Basal cell carcinoma of skin of face 2007   Dr. Delman Cheadle    Past Surgical History:  Procedure Laterality Date  . MOHS SURGERY     removal of basal cell carcinoma    Social History   Social History  . Marital status: Married   Spouse name: N/A  . Number of children: N/A  . Years of education: N/A   Occupational History  . [employer].      Advertising copywriter    Social History Main Topics  . Smoking status: Never Smoker  . Smokeless tobacco: Never Used  . Alcohol use Yes     Comment: couple drinks per week  . Drug use: No  . Sexual activity: Not on file   Other Topics Concern  . Not on file   Social History Narrative  . No narrative on file    Family History  Problem Relation Age of Onset  . Hypertension Mother   . Hyperlipidemia Father   . Hypertension Father   . Diabetes Maternal Grandfather   . Depression Sister     Outpatient Encounter Prescriptions as of 07/23/2017  Medication Sig  . clonazePAM (KLONOPIN) 0.25 MG disintegrating tablet Take one tab by mouth one to three times daily as needed for anxiety  . Fluticasone-Salmeterol (ADVAIR DISKUS) 250-50 MCG/DOSE AEPB Inhale 1 puff into the lungs 2 (two) times daily.  . propranolol (INDERAL) 10 MG tablet TAKE 1 TABLET BY MOUTH TWICE DAILY AS NEEDED FOR PUBLIC SPEAKING  . sertraline (ZOLOFT) 100 MG tablet Take 1.5 tablets (150 mg total) by mouth daily.  Marland Kitchen Spacer/Aero Chamber [employer] Use  as directed with inhaler  . VENTOLIN HFA 108 (90 Base) MCG/ACT inhaler INHALE 2 PUFFS BY MOUTH INTO THE LUNGS EVERY 4 HOURS AS NEEDED FOR WHEEZING.  . [DISCONTINUED] Fluticasone-Salmeterol (ADVAIR DISKUS) 250-50 MCG/DOSE AEPB Inhale 1 puff into the lungs 2 (two) times daily.  . [DISCONTINUED] sertraline (ZOLOFT) 100 MG tablet Take 1 tablet (100 mg total) by mouth daily.   No facility-administered encounter medications on file as of 07/23/2017.          Objective:   Physical Exam  Constitutional: She is oriented to person, place, and time. She appears well-developed and well-nourished.  HENT:  Head: Normocephalic and atraumatic.  Cardiovascular: Normal rate, regular rhythm and normal heart sounds.   Pulmonary/Chest: Effort normal and breath sounds  normal.  Neurological: She is alert and oriented to person, place, and time.  Skin: Skin is warm and dry.  Psychiatric: She has a normal mood and affect. Her behavior is normal.          Assessment & Plan:  Asthma - Moderate persistent-we'll continue with Advair for 1 more month. If she is doing well at that time the we will step her down to just inhaled corticosteroid and the plan will be to continue that for 2 months until she is doing well if at that time she is doing well and we can stop it completely. Monitor frequency of rescue inhaler. We also gave her a peak flow meter today which she can use at home and said it for her so that she can better differentiate the chest tightness from asthma versus her anxiety.  Anxiety - we discussed several options. I think getting back in with her therapist would be really helpful. We also discussed possibly increasing the sertraline 250 mg just for short period of time. I'll see her back in 6-8 weeks.  Vasovagal event after dry needling-this doesn't sound unusual since she's had a history of this before. It doesn't sound worrisome I don't think she needs any additional workup at this time.

## 2017-07-27 ENCOUNTER — Ambulatory Visit: Payer: 59 | Attending: Osteopathic Medicine | Admitting: Physical Therapy

## 2017-07-27 DIAGNOSIS — M79632 Pain in left forearm: Secondary | ICD-10-CM | POA: Insufficient documentation

## 2017-07-27 DIAGNOSIS — M6281 Muscle weakness (generalized): Secondary | ICD-10-CM | POA: Insufficient documentation

## 2017-07-27 DIAGNOSIS — M79631 Pain in right forearm: Secondary | ICD-10-CM | POA: Insufficient documentation

## 2017-07-27 DIAGNOSIS — M25561 Pain in right knee: Secondary | ICD-10-CM | POA: Insufficient documentation

## 2017-07-27 NOTE — Therapy (Signed)
[facility] [address] [place], [place], [address] Phone: [phone]   Fax:  [phone]  Physical Therapy Treatment  Patient Details  Name: Sue [name] MRN: [id number] Date of Birth: 1971-09-02 Referring Provider: Dr. Emeterio Reeve  Encounter Date: 07/27/2017      PT End of Session - 07/27/17 1423    Visit Number 7   Number of Visits 12   Date for PT Re-Evaluation 08/16/17   Authorization Type UMR   PT Start Time 0731   PT Stop Time 0809   PT Time Calculation (min) 38 min   Activity Tolerance Patient tolerated treatment well      Past Medical History:  Diagnosis Date  . Anxiety   . Asthma   . Basal cell carcinoma of skin of face 2007   Dr. Delman Cheadle    Past Surgical History:  Procedure Laterality Date  . MOHS SURGERY     removal of basal cell carcinoma    There were no vitals filed for this visit.      Subjective Assessment - 07/27/17 0734    Subjective I think I'm moving in the right direction but I did have 2 nights where I didn't wear the brace and it hurt more in the morning.  I thought the Graston helped a lot.     Currently in Pain? Yes   Pain Score 3    Pain Location Elbow   Pain Orientation Left   Pain Type Chronic pain   Pain Frequency Constant   Aggravating Factors  gripping, texting   Pain Relieving Factors Icy hot sleeve and tennis elbow brace                         [facility] Adult PT Treatment/Exercise - 07/27/17 0001      Ultrasound   Ultrasound Location left lateral forearm   Ultrasound Parameters 1.2 W/cm2 92mz 8 min   Ultrasound Goals Pain     Iontophoresis   Type of Iontophoresis Dexamethasone   Location L lateral epicondyle    Dose 80 ma/min  lot [id number]  Time 4-6 wear time  #4     Manual Therapy   Myofascial Release Graston G2 and G3 instrument to left forearm extensor muscle wad with sweeping, tendon strumming                    PT Short Term Goals - 07/27/17 1428      PT SHORT TERM GOAL #1   Title Pt will be I with advanced HEP to assist with bil lateral epicondylitis pain to assist with work duties   Status Achieved     PT SHORT TERM GOAL #2   Title Pt will report L elbow pain to be reduced x 50% with functional activities   Time 3   Period Weeks   Status On-going     PT SHORT TERM GOAL #3   Title Pt will be able to carry a dining room tray with bil elbow pain </= 3/10   Time 3   Period Weeks   Status On-going     PT SHORT TERM GOAL #4   Title Pt will be able to write/type with 50% less difficulty   Time 3   Period Weeks   Status On-going           PT Long Term Goals - 07/27/17 1428      PT LONG TERM GOAL #  1   Title Pt will be able to drive with 63% less difficulty   Time 6   Period Weeks   Status On-going     PT LONG TERM GOAL #2   Title Pt will be able to open jar with L hand with 75% less difficulty   Time 6   Period Weeks   Status On-going     PT LONG TERM GOAL #3   Title Pt will be able to wring out a towel with 3/10 bil elbow pain   Time 6   Period Weeks   Status On-going     PT LONG TERM GOAL #4   Title Pt will be able to demo improved L wrist flexion to 50 degrees to assist with ADLs   Time 6   Period Weeks   Status On-going     PT LONG TERM GOAL #5   Title Pt will demo improved L wrist strength to >/= 4/5 all directions (elbow flexion too) to assist with household chores   Time 6   Period Weeks   Status On-going     PT LONG TERM GOAL #6   Title Pt will be able to demo improved L dynamometer grip strength >/= 60 lbs to assist with lifting heavy items   Time 6   Period Weeks   Status On-going               Plan - 07/27/17 1424    Clinical Impression Statement The patient reports she feels she has turned the corner a little bit.  Feels Graston myofascial soft tissue work has helped in addition to U/S and ionto.  She continues to wear her tennis elbow  brace fairly constantly.  She removed it for 2 nights and had an increase in morning pain in response.  Will continue anti-inflammatory and pain relieving interventions.     Rehab Potential Excellent   Clinical Impairments Affecting Rehab Potential NO DRY NEEDLING   PT Frequency 2x / week   PT Duration 6 weeks   PT Treatment/Interventions ADLs/Self Care Home Management;Cryotherapy;Electrical Stimulation;Iontophoresis 12m/ml Dexamethasone;Moist Heat;Ultrasound;Therapeutic exercise;Therapeutic activities;Functional mobility training;Patient/family education;Manual techniques;Passive range of motion;Taping   PT Next Visit Plan  ionto #5; Ultrasound, soft tissue work to left shoulder, left arm and forearm;   Graston myofascial;  check for progress with STGS      Patient will benefit from skilled therapeutic intervention in order to improve the following deficits and impairments:  Decreased activity tolerance, Decreased coordination, Decreased range of motion, Decreased mobility, Decreased strength, Impaired flexibility, Increased muscle spasms, Hypomobility, Impaired UE functional use, Improper body mechanics, Pain  Visit Diagnosis: Pain in left forearm  Muscle weakness (generalized)     Problem List Patient Active Problem List   Diagnosis Date Noted  . Low back pain 08/16/2015  . Right ankle pain 09/13/2012  . Right leg pain 08/08/2012  . Cyst of left ovary 12/26/2011  . Muscle spasm of calf 05/22/2011  . Menses, irregular 09/17/2010  . FOOT PAIN, BILATERAL 06/24/2010  . NUMBNESS 06/24/2010  . ANXIETY DISORDER, GENERALIZED 07/30/2009  . DIABETES MELLITUS, GESTATIONAL, HX OF 07/30/2009  . ASTHMA 11/12/2008   SRuben Im PT 07/27/17 2:32 PM Phone: [phone]: [phone] Sue Singh9/02/2017, 2:31 PM  [facility] [address] [place] [address]Phone: [phone]  Fax:   [phone] Name: Sue VINKMRN: [id number] of Birth: 110-02-1971

## 2017-07-30 ENCOUNTER — Ambulatory Visit: Payer: 59 | Admitting: Physical Therapy

## 2017-07-30 DIAGNOSIS — M6281 Muscle weakness (generalized): Secondary | ICD-10-CM | POA: Diagnosis not present

## 2017-07-30 DIAGNOSIS — M25561 Pain in right knee: Secondary | ICD-10-CM | POA: Diagnosis not present

## 2017-07-30 DIAGNOSIS — M79632 Pain in left forearm: Secondary | ICD-10-CM

## 2017-07-30 DIAGNOSIS — M79631 Pain in right forearm: Secondary | ICD-10-CM

## 2017-07-30 NOTE — Therapy (Signed)
[facility] [address], [place], [place], [address] Phone: [phone]   Fax:  [phone]  Physical Therapy Treatment  Patient Details  Name: Sue [name] MRN: [id number] Date of Birth: Mar 31, 1971 Referring Provider: Dr. Emeterio Reeve  Encounter Date: 07/30/2017      PT End of Session - 07/30/17 0843    Visit Number 8   Number of Visits 12   Date for PT Re-Evaluation 08/16/17   Authorization Type UMR   PT Start Time 0800   PT Stop Time 0842   PT Time Calculation (min) 42 min   Activity Tolerance Patient tolerated treatment well      Past Medical History:  Diagnosis Date  . Anxiety   . Asthma   . Basal cell carcinoma of skin of face 2007   Dr. Delman Cheadle    Past Surgical History:  Procedure Laterality Date  . MOHS SURGERY     removal of basal cell carcinoma    There were no vitals filed for this visit.      Subjective Assessment - 07/30/17 0800    Subjective Going OK.  Waking up during the night but overall feeling a lot better especially during the day.     Currently in Pain? Yes   Pain Score 1    Pain Location Elbow   Pain Orientation Left   Pain Type Chronic pain                         [facility] Adult PT Treatment/Exercise - 07/30/17 0001      Ultrasound   Ultrasound Location left lateral forearm   Ultrasound Parameters 1.2W/cm2 1Mhz;8 min   Ultrasound Goals Pain     Iontophoresis   Type of Iontophoresis Dexamethasone   Location L lateral epicondyle    Dose 80 ma/min  lot [id number]   Time 4-6 wear time  #5     Manual Therapy   Myofascial Release Graston G2, G3 G4 instrument to left forearm extensor muscle wad, biceps and triceps with sweeping, tendon strumming                   PT Short Term Goals - 07/30/17 1216      PT SHORT TERM GOAL #1   Title Pt will be I with advanced HEP to assist with bil lateral epicondylitis pain to assist with work duties    Status Achieved     PT Laplace #2   Title Pt will report L elbow pain to be reduced x 50% with functional activities   Time 3   Period Weeks   Status On-going     PT SHORT TERM GOAL #3   Title Pt will be able to carry a dining room tray with bil elbow pain </= 3/10   Time 3   Period Weeks   Status On-going     PT SHORT TERM GOAL #4   Title Pt will be able to write/type with 50% less difficulty   Time 3   Period Weeks   Status On-going           PT Long Term Goals - 07/30/17 1216      PT LONG TERM GOAL #1   Title Pt will be able to drive with 47% less difficulty   Time 6   Period Weeks   Status On-going     PT LONG TERM GOAL #2   Title Pt will  be able to open jar with L hand with 75% less difficulty   Time 6   Period Weeks   Status On-going     PT LONG TERM GOAL #3   Title Pt will be able to wring out a towel with 3/10 bil elbow pain   Time 6   Period Weeks   Status On-going     PT LONG TERM GOAL #4   Title Pt will be able to demo improved L wrist flexion to 50 degrees to assist with ADLs   Time 6   Period Weeks   Status On-going     PT LONG TERM GOAL #5   Title Pt will demo improved L wrist strength to >/= 4/5 all directions (elbow flexion too) to assist with household chores   Time 6   Period Weeks   Status On-going     PT LONG TERM GOAL #6   Title Pt will be able to demo improved L dynamometer grip strength >/= 60 lbs to assist with lifting heavy items   Time 6   Period Weeks   Status On-going               Plan - 07/30/17 0844    Clinical Impression Statement Discussed sleep modifications to avoid left sidelying which may be contributing to night time pain.  Decreased myofascial restrictions in wrist extensors but still present in triceps.  Continue with pain relieving interventions.     Rehab Potential Excellent   Clinical Impairments Affecting Rehab Potential NO DRY NEEDLING   PT Frequency 2x / week   PT Duration 6 weeks    PT Treatment/Interventions ADLs/Self Care Home Management;Cryotherapy;Electrical Stimulation;Iontophoresis 47m/ml Dexamethasone;Moist Heat;Ultrasound;Therapeutic exercise;Therapeutic activities;Functional mobility training;Patient/family education;Manual techniques;Passive range of motion;Taping   PT Next Visit Plan  ionto #6; Ultrasound, soft tissue work to left shoulder, left arm and forearm;   Graston myofascial;  check for progress with STGS      Patient will benefit from skilled therapeutic intervention in order to improve the following deficits and impairments:  Decreased activity tolerance, Decreased coordination, Decreased range of motion, Decreased mobility, Decreased strength, Impaired flexibility, Increased muscle spasms, Hypomobility, Impaired UE functional use, Improper body mechanics, Pain  Visit Diagnosis: Pain in left forearm  Muscle weakness (generalized)  Pain in right forearm     Problem List Patient Active Problem List   Diagnosis Date Noted  . Low back pain 08/16/2015  . Right ankle pain 09/13/2012  . Right leg pain 08/08/2012  . Cyst of left ovary 12/26/2011  . Muscle spasm of calf 05/22/2011  . Menses, irregular 09/17/2010  . FOOT PAIN, BILATERAL 06/24/2010  . NUMBNESS 06/24/2010  . ANXIETY DISORDER, GENERALIZED 07/30/2009  . DIABETES MELLITUS, GESTATIONAL, HX OF 07/30/2009  . ASTHMA 11/12/2008   SRuben Im PT 07/30/17 12:18 PM Phone: [phone]: [phone]  SAlvera Singh9/05/2017, 12:18 PM  [facility] [address] [place] [address]Phone: [phone]  Fax:  [phone] Name: Sue HANBACKMRN: [id number] of Birth: 110-Nov-1972

## 2017-08-02 ENCOUNTER — Encounter: Payer: Self-pay | Admitting: Physical Therapy

## 2017-08-02 ENCOUNTER — Ambulatory Visit: Payer: 59 | Admitting: Physical Therapy

## 2017-08-02 DIAGNOSIS — M79631 Pain in right forearm: Secondary | ICD-10-CM

## 2017-08-02 DIAGNOSIS — M6281 Muscle weakness (generalized): Secondary | ICD-10-CM

## 2017-08-02 DIAGNOSIS — M25561 Pain in right knee: Secondary | ICD-10-CM | POA: Diagnosis not present

## 2017-08-02 DIAGNOSIS — M79632 Pain in left forearm: Secondary | ICD-10-CM | POA: Diagnosis not present

## 2017-08-02 NOTE — Therapy (Signed)
[facility] [address] [place], [place], [address] Phone: [phone]   Fax:  [phone]  Physical Therapy Treatment  Patient Details  Name: Sue [name] MRN: [id number] Date of Birth: 1971/04/01 Referring Provider: Dr. Emeterio Reeve  Encounter Date: 08/02/2017      PT End of Session - 08/02/17 1443    Visit Number 9   Number of Visits 12   Date for PT Re-Evaluation 08/16/17   Authorization Type UMR   PT Start Time 1400   PT Stop Time 1443   PT Time Calculation (min) 43 min   Activity Tolerance Patient tolerated treatment well   Behavior During Therapy [facility] for tasks assessed/performed      Past Medical History:  Diagnosis Date  . Anxiety   . Asthma   . Basal cell carcinoma of skin of face 2007   Dr. Delman Cheadle    Past Surgical History:  Procedure Laterality Date  . MOHS SURGERY     removal of basal cell carcinoma    There were no vitals filed for this visit.      Subjective Assessment - 08/02/17 1405    Subjective I feel alot better.  I am not as tender on the bone.  My muscles feel like they need to be stretched.  I need to make an appointment with the doctor. I will be traveling the next few days. When I wake up in the morning I feel stiffer.    Pertinent History R lateral epicondylitis x 1 year but pain is mild; pt sits at a computer all day. Issued wrist flex/ext stretches by MD. L shoulder occasionally pops.    Limitations Lifting;Writing;House hold activities   How long can you sit comfortably? no problem   How long can you stand comfortably? no problem   How long can you walk comfortably? no problem   Diagnostic tests no diagnostic testing performed   Patient Stated Goals to be able to do my activities without pain   Currently in Pain? Yes   Pain Score 2    Pain Location Elbow   Pain Orientation Left   Pain Descriptors / Indicators Tender   Pain Type Chronic pain   Pain Onset  More than a month ago   Pain Frequency Intermittent   Aggravating Factors  gripping, texting   Pain Relieving Factors icy hot sleeve and tennis elbow brace   Effect of Pain on Daily Activities all activities need modification   Multiple Pain Sites No                         [facility] Adult PT Treatment/Exercise - 08/02/17 0001      Modalities   Modalities Ultrasound     Ultrasound   Ultrasound Location left forearm extensor muscle    Ultrasound Parameters 1.2 w/cm2; 0.8 w/cm2; 8 min; 100%   Ultrasound Goals Pain     Iontophoresis   Type of Iontophoresis Dexamethasone   Location L lateral epicondyle    Dose 80 ma/min  lot [id number]   Time 4-6 wear time  #6     Manual Therapy   Manual Therapy Soft tissue mobilization   Soft tissue mobilization deep soft tissue work to the left extensor carpi radialis brevis; bracialis; deltoid, tricep, and hand                  PT Short Term Goals - 08/02/17 1445  PT SHORT TERM GOAL #1   Title Pt will be I with advanced HEP to assist with bil lateral epicondylitis pain to assist with work duties   Time 3   Period Weeks   Status Achieved     PT SHORT TERM GOAL #2   Title Pt will report L elbow pain to be reduced x 50% with functional activities   Baseline around the left epicondyle   Period Weeks   Status On-going     PT SHORT TERM GOAL #3   Title Pt will be able to carry a dining room tray with bil elbow pain </= 3/10   Time 3   Period Weeks   Status On-going     PT SHORT TERM GOAL #4   Title Pt will be able to write/type with 50% less difficulty   Time 3   Period Weeks   Status On-going           PT Long Term Goals - 07/30/17 1216      PT LONG TERM GOAL #1   Title Pt will be able to drive with 73% less difficulty   Time 6   Period Weeks   Status On-going     PT LONG TERM GOAL #2   Title Pt will be able to open jar with L hand with 75% less difficulty   Time 6   Period Weeks   Status  On-going     PT LONG TERM GOAL #3   Title Pt will be able to wring out a towel with 3/10 bil elbow pain   Time 6   Period Weeks   Status On-going     PT LONG TERM GOAL #4   Title Pt will be able to demo improved L wrist flexion to 50 degrees to assist with ADLs   Time 6   Period Weeks   Status On-going     PT LONG TERM GOAL #5   Title Pt will demo improved L wrist strength to >/= 4/5 all directions (elbow flexion too) to assist with household chores   Time 6   Period Weeks   Status On-going     PT LONG TERM GOAL #6   Title Pt will be able to demo improved L dynamometer grip strength >/= 60 lbs to assist with lifting heavy items   Time 6   Period Weeks   Status On-going               Plan - 08/02/17 1443    Clinical Impression Statement Patient has decreased pain around the left epicondyle.  Her muscles still feel tight. She has tightness in left deltoid and left tricep too.  Patient has one more week for therapy then a renewal will be needed.  Patient is using her last ionto. Patient will benefit from skilled therapy to reduce pain and improve function.    Rehab Potential Excellent   Clinical Impairments Affecting Rehab Potential NO DRY NEEDLING   PT Duration 6 weeks   PT Treatment/Interventions ADLs/Self Care Home Management;Cryotherapy;Electrical Stimulation;Iontophoresis 82m/ml Dexamethasone;Moist Heat;Ultrasound;Therapeutic exercise;Therapeutic activities;Functional mobility training;Patient/family education;Manual techniques;Passive range of motion;Taping   PT Next Visit Plan Ultrasound, soft tissue work to left shoulder, left arm and forearm;   Graston myofascial;  check for progress with STGS   PT Home Exercise Plan progress as needed   Consulted and Agree with Plan of Care Patient      Patient will benefit from skilled therapeutic intervention in order to improve the following  deficits and impairments:  Decreased activity tolerance, Decreased coordination,  Decreased range of motion, Decreased mobility, Decreased strength, Impaired flexibility, Increased muscle spasms, Hypomobility, Impaired UE functional use, Improper body mechanics, Pain  Visit Diagnosis: Pain in left forearm  Muscle weakness (generalized)  Pain in right forearm     Problem List Patient Active Problem List   Diagnosis Date Noted  . Low back pain 08/16/2015  . Right ankle pain 09/13/2012  . Right leg pain 08/08/2012  . Cyst of left ovary 12/26/2011  . Muscle spasm of calf 05/22/2011  . Menses, irregular 09/17/2010  . FOOT PAIN, BILATERAL 06/24/2010  . NUMBNESS 06/24/2010  . ANXIETY DISORDER, GENERALIZED 07/30/2009  . DIABETES MELLITUS, GESTATIONAL, HX OF 07/30/2009  . ASTHMA 11/12/2008    Earlie Counts, PT 08/02/17 2:47 PM    [facility] [address] [place], [place], [address] Phone: [phone]   Fax:  [phone]  Name: ALLYCE BOCHICCHIO MRN: [id number] Date of Birth: [birth year]

## 2017-08-05 ENCOUNTER — Ambulatory Visit (INDEPENDENT_AMBULATORY_CARE_PROVIDER_SITE_OTHER): Payer: 59

## 2017-08-05 ENCOUNTER — Ambulatory Visit (INDEPENDENT_AMBULATORY_CARE_PROVIDER_SITE_OTHER): Payer: 59 | Admitting: Sports Medicine

## 2017-08-05 DIAGNOSIS — M7712 Lateral epicondylitis, left elbow: Secondary | ICD-10-CM | POA: Insufficient documentation

## 2017-08-05 DIAGNOSIS — M25561 Pain in right knee: Secondary | ICD-10-CM | POA: Diagnosis not present

## 2017-08-05 DIAGNOSIS — M25562 Pain in left knee: Secondary | ICD-10-CM | POA: Diagnosis not present

## 2017-08-05 MED ORDER — MELOXICAM 15 MG PO TABS: ORAL_TABLET | ORAL | 3 refills | Status: DC

## 2017-08-05 MED FILL — MELOXICAM 15 MG TABLET: 15 | 30 days supply | Qty: 30 | Fill #0

## 2017-08-05 NOTE — Assessment & Plan Note (Signed)
Medial joint line pain consistent with early osteoarthritis versus meniscal injury. No mechanical symptoms. X-rays, meloxicam, I'm going to add this to her physical therapy.

## 2017-08-05 NOTE — Progress Notes (Signed)
   Subjective:    I'm seeing this patient as a consultation for:  Dr. Beatrice Lecher  CC: Right knee pain, left elbow pain  HPI: Left elbow pain: Did see Dr. Sheppard Coil, confirm diagnosis of tennis elbow, tennis elbow brace is on, she's been doing some physical therapy but mostly topical modalities without eccentric rehabilitation or strengthening. Not taking NSAIDs.  Right knee pain: Present for just a few days to a week, no injuries, moderate, persistent and localized medial joint line without pain, minimal gelling. No swelling.   Past medical history, Surgical history, Family history not pertinant except as noted below, Social history, Allergies, and medications have been entered into the medical record, reviewed, and no changes needed.   Review of Systems: No headache, visual changes, nausea, vomiting, diarrhea, constipation, dizziness, abdominal pain, skin rash, fevers, chills, night sweats, weight loss, swollen lymph nodes, body aches, joint swelling, muscle aches, chest pain, shortness of breath, mood changes, visual or auditory hallucinations.   Objective:   General: Well Developed, well nourished, and in no acute distress.  Neuro:  Extra-ocular muscles intact, able to move all 4 extremities, sensation grossly intact.  Deep tendon reflexes tested were normal. Psych: Alert and oriented, mood congruent with affect. ENT:  Ears and nose appear unremarkable.  Hearing grossly normal. Neck: Unremarkable overall appearance, trachea midline.  No visible thyroid enlargement. Eyes: Conjunctivae and lids appear unremarkable.  Pupils equal and round. Skin: Warm and dry, no rashes noted.  Cardiovascular: Pulses palpable, no extremity edema. Left Elbow: Unremarkable to inspection. Range of motion full pronation, supination, flexion, extension. Strength is full to all of the above directions Stable to varus, valgus stress. Negative moving valgus stress test. Tender to palpation at the common  extensor tendon origin Ulnar nerve does not sublux. Negative cubital tunnel Tinel's. Right Knee: Normal to inspection with no erythema or effusion or obvious bony abnormalities. Tender at the medial joint line ROM normal in flexion and extension and lower leg rotation. Ligaments with solid consistent endpoints including ACL, PCL, LCL, MCL. Positive McMurray sign without palpable mechanical symptoms Non painful patellar compression. Patellar and quadriceps tendons unremarkable. Hamstring and quadriceps strength is normal.  Impression and Recommendations:   This case required medical decision making of moderate complexity.  Right knee pain Medial joint line pain consistent with early osteoarthritis versus meniscal injury. No mechanical symptoms. X-rays, meloxicam, I'm going to add this to her physical therapy.  Left lateral epicondylitis Continue tennis elbow brace. Adding meloxicam, as well as eccentric rehabilitation in physical therapy. Return in one month, injection if no better.  ___________________________________________ Gwen Her. Dianah Field, M.D., ABFM., CAQSM. Primary Care and [place] Instructor of [place] [employer] [facility]

## 2017-08-05 NOTE — Assessment & Plan Note (Signed)
Continue tennis elbow brace. Adding meloxicam, as well as eccentric rehabilitation in physical therapy. Return in one month, injection if no better.

## 2017-08-10 ENCOUNTER — Ambulatory Visit: Payer: 59 | Admitting: Physical Therapy

## 2017-08-10 ENCOUNTER — Encounter: Payer: Self-pay | Admitting: Physical Therapy

## 2017-08-10 DIAGNOSIS — M79631 Pain in right forearm: Secondary | ICD-10-CM

## 2017-08-10 DIAGNOSIS — M79632 Pain in left forearm: Secondary | ICD-10-CM | POA: Diagnosis not present

## 2017-08-10 DIAGNOSIS — M6281 Muscle weakness (generalized): Secondary | ICD-10-CM | POA: Diagnosis not present

## 2017-08-10 DIAGNOSIS — M25561 Pain in right knee: Secondary | ICD-10-CM | POA: Diagnosis not present

## 2017-08-10 NOTE — Therapy (Signed)
[facility] [address], [place], [place], [address] Phone: [phone]   Fax:  [phone]  Physical Therapy Treatment  Patient Details  Name: Sue [name] MRN: [id number] Date of Birth: 15-Jun-1971 Referring Provider: Dr. Emeterio Reeve  Encounter Date: 08/10/2017      PT End of Session - 08/10/17 1542    Visit Number 10   Date for PT Re-Evaluation 08/16/17   Authorization Type UMR   PT Start Time 1447   PT Stop Time 1540   PT Time Calculation (min) 53 min   Activity Tolerance Patient tolerated treatment well   Behavior During Therapy [facility] for tasks assessed/performed      Past Medical History:  Diagnosis Date  . Anxiety   . Asthma   . Basal cell carcinoma of skin of face 2007   Dr. Delman Cheadle    Past Surgical History:  Procedure Laterality Date  . MOHS SURGERY     removal of basal cell carcinoma    There were no vitals filed for this visit.      Subjective Assessment - 08/10/17 1453    Subjective My elbow is feeling better.  I have a new script for my knee.    Pertinent History R lateral epicondylitis x 1 year but pain is mild; pt sits at a computer all day. Issued wrist flex/ext stretches by MD. L shoulder occasionally pops.    Limitations Lifting;Writing;House hold activities   How long can you sit comfortably? no problem   How long can you walk comfortably? no problem   Diagnostic tests no diagnostic testing performed   Patient Stated Goals to be able to do my activities without pain   Currently in Pain? Yes   Pain Score 1    Pain Location Elbow   Pain Orientation Left   Pain Descriptors / Indicators Tender   Pain Type Chronic pain   Pain Onset More than a month ago   Pain Frequency Intermittent   Aggravating Factors  gripping and texting   Pain Relieving Factors icy hot sleeve and tennis elbow brace   Effect of Pain on Daily Activities all activities need modification   Multiple  Pain Sites No                         [facility] Adult PT Treatment/Exercise - 08/10/17 0001      Modalities   Modalities Ultrasound     Ultrasound   Ultrasound Location lateral left forearm   Ultrasound Parameters 1.2 w/cm2; 1 mhz; 100% ; 8 min   Ultrasound Goals Pain     Manual Therapy   Manual Therapy Soft tissue mobilization;Joint mobilization;Passive ROM   Joint Mobilization gapping of the left radial head   Soft tissue mobilization left deltoid, triceps, left wrist extensors, left suprinator, extensor carpi radialis longus and brevisand pronator teres and left palm   Passive ROM stretch left arm into wrist and elbow extension with forearm pronation and shoulder horizontal abduction                PT Education - 08/10/17 1542    Education provided Yes   Education Details stretching left forearm and pectoralis   Person(s) Educated Patient   Methods Explanation;Demonstration   Comprehension Verbalized understanding          PT Short Term Goals - 08/10/17 1545      PT SHORT TERM GOAL #2   Title Pt will report L  elbow pain to be reduced x 50% with functional activities   Time 3   Period Weeks   Status Achieved     PT SHORT TERM GOAL #3   Title Pt will be able to carry a dining room tray with bil elbow pain </= 3/10   Time 3   Period Weeks   Status Achieved     PT SHORT TERM GOAL #4   Title Pt will be able to write/type with 50% less difficulty   Time 3   Period Weeks   Status Achieved           PT Long Term Goals - 07/30/17 1216      PT LONG TERM GOAL #1   Title Pt will be able to drive with 16% less difficulty   Time 6   Period Weeks   Status On-going     PT LONG TERM GOAL #2   Title Pt will be able to open jar with L hand with 75% less difficulty   Time 6   Period Weeks   Status On-going     PT LONG TERM GOAL #3   Title Pt will be able to wring out a towel with 3/10 bil elbow pain   Time 6   Period Weeks   Status  On-going     PT LONG TERM GOAL #4   Title Pt will be able to demo improved L wrist flexion to 50 degrees to assist with ADLs   Time 6   Period Weeks   Status On-going     PT LONG TERM GOAL #5   Title Pt will demo improved L wrist strength to >/= 4/5 all directions (elbow flexion too) to assist with household chores   Time 6   Period Weeks   Status On-going     PT LONG TERM GOAL #6   Title Pt will be able to demo improved L dynamometer grip strength >/= 60 lbs to assist with lifting heavy items   Time 6   Period Weeks   Status On-going               Plan - 08/10/17 1543    Clinical Impression Statement Patient repors less pain with gripping and texting, pain level 0.5/10.  Patient has tightness in left forearm and triceps and deltoid.  Patient has not had a flare-up in awhile.  Patient has met her STG's.  Patient will benefit from skilled therapy to reduce pain and improve function.    Rehab Potential Excellent   Clinical Impairments Affecting Rehab Potential NO DRY NEEDLING   PT Frequency 2x / week   PT Duration 6 weeks   PT Treatment/Interventions ADLs/Self Care Home Management;Cryotherapy;Electrical Stimulation;Iontophoresis 79m/ml Dexamethasone;Moist Heat;Ultrasound;Therapeutic exercise;Therapeutic activities;Functional mobility training;Patient/family education;Manual techniques;Passive range of motion;Taping   PT Next Visit Plan Ultrasound, soft tissue work to left shoulder, left arm and forearm;   Graston myofascial; reassess the right knee and continue the left elbow   PT Home Exercise Plan progress as needed   Consulted and Agree with Plan of Care Patient      Patient will benefit from skilled therapeutic intervention in order to improve the following deficits and impairments:  Decreased activity tolerance, Decreased coordination, Decreased range of motion, Decreased mobility, Decreased strength, Impaired flexibility, Increased muscle spasms, Hypomobility, Impaired  UE functional use, Improper body mechanics, Pain  Visit Diagnosis: Pain in left forearm  Muscle weakness (generalized)  Pain in right forearm     Problem List Patient  Active Problem List   Diagnosis Date Noted  . Right knee pain 08/05/2017  . Left lateral epicondylitis 08/05/2017  . Low back pain 08/16/2015  . Right ankle pain 09/13/2012  . Right leg pain 08/08/2012  . Cyst of left ovary 12/26/2011  . Muscle spasm of calf 05/22/2011  . Menses, irregular 09/17/2010  . FOOT PAIN, BILATERAL 06/24/2010  . NUMBNESS 06/24/2010  . ANXIETY DISORDER, GENERALIZED 07/30/2009  . DIABETES MELLITUS, GESTATIONAL, HX OF 07/30/2009  . ASTHMA 11/12/2008    Earlie Counts, PT 08/10/17 3:47 PM   [facility] [address] [place], [place], [address] Phone: [phone]   Fax:  [phone]  Name: Sue [name] MRN: [id number] Date of Birth: [birth year]

## 2017-08-13 ENCOUNTER — Ambulatory Visit: Payer: 59 | Admitting: Physical Therapy

## 2017-08-13 ENCOUNTER — Encounter: Payer: Self-pay | Admitting: Physical Therapy

## 2017-08-13 DIAGNOSIS — M6281 Muscle weakness (generalized): Secondary | ICD-10-CM | POA: Diagnosis not present

## 2017-08-13 DIAGNOSIS — M25561 Pain in right knee: Secondary | ICD-10-CM | POA: Diagnosis not present

## 2017-08-13 DIAGNOSIS — M79631 Pain in right forearm: Secondary | ICD-10-CM

## 2017-08-13 DIAGNOSIS — M79632 Pain in left forearm: Secondary | ICD-10-CM

## 2017-08-13 NOTE — Therapy (Signed)
[facility] [address], [place], [place], [address] Phone: [phone]   Fax:  [phone]  Physical Therapy Treatment  Patient Details  Name: RIYANA BIEL MRN: [id number] Date of Birth: 1971/08/03 Referring Provider: Dr. Aundria Mems (primary doctor)  Encounter Date: 08/13/2017      PT End of Session - 08/13/17 1109    Visit Number 11   Date for PT Re-Evaluation 10/20/17   Authorization Type UMR   PT Start Time 0930   PT Stop Time 1015   PT Time Calculation (min) 45 min   Activity Tolerance Patient tolerated treatment well   Behavior During Therapy [facility] for tasks assessed/performed      Past Medical History:  Diagnosis Date  . Anxiety   . Asthma   . Basal cell carcinoma of skin of face 2007   Dr. Delman Cheadle    Past Surgical History:  Procedure Laterality Date  . MOHS SURGERY     removal of basal cell carcinoma    There were no vitals filed for this visit.      Subjective Assessment - 08/13/17 0936    Subjective Elbow feeling 80-95% better. Patient reports the right knee pain came on 4 weeks ago suddenly.      Pertinent History R lateral epicondylitis x 1 year but pain is mild; pt sits at a computer all day. Issued wrist flex/ext stretches by MD. L shoulder occasionally pops.    Limitations Lifting;Writing;House hold activities   How long can you sit comfortably? no problem   How long can you stand comfortably? no problem   How long can you walk comfortably? no problem   Diagnostic tests no diagnostic testing performed   Patient Stated Goals to be able to do my activities without pain; decrease right knee pain   Currently in Pain? Yes   Pain Score 1    Pain Location Elbow   Pain Orientation Left   Pain Descriptors / Indicators Tender   Pain Type Chronic pain   Pain Onset More than a month ago   Pain Frequency Intermittent   Aggravating Factors  gripping, texting, gripping and lifting  items   Pain Relieving Factors elbow brace   Multiple Pain Sites No   Pain Score 4   Pain Location Knee   Pain Orientation Right   Pain Descriptors / Indicators Sharp  localized   Pain Type Acute pain   Pain Onset 1 to 4 weeks ago   Pain Frequency Intermittent   Aggravating Factors  sit cross legged medial knee pain; sit with right leg tucked under buttocks;    Pain Relieving Factors change position            [facility] PT Assessment - 08/13/17 0001      Assessment   Medical Diagnosis lateral epicondylitis; M25.561 Acute pain of right knee   Referring Provider Dr. Aundria Mems  primary doctor   Onset Date/Surgical Date 07/23/17  for right knee   Hand Dominance Left   Next MD Visit 4-5 months   Prior Therapy none     Precautions   Precautions Other (comment)   Precaution Comments NO DRY NEEDLING due to passing out     Restrictions   Weight Bearing Restrictions No     Balance Screen   Has the patient fallen in the past 6 months No   Has the patient had a decrease in activity level because of a fear of falling?  No   Is  the patient reluctant to leave their home because of a fear of falling?  No     Home Ecologist residence     Prior Function   Level of Independence Independent     Cognition   Overall Cognitive Status Within Functional Limits for tasks assessed     Posture/Postural Control   Posture/Postural Control No significant limitations     ROM / Strength   AROM / PROM / Strength AROM;PROM;Strength     PROM   Overall PROM Comments left elbow PROM of extension -5     Strength   Left Shoulder ABduction 4-/5   Left Elbow Flexion 4/5   Left Elbow Extension 4/5   Left Forearm Pronation 4/5   Left Forearm Supination 5/5   Left Wrist Extension 4/5   Right Knee Flexion 5/5   Right Knee Extension 4/5     Palpation   SI assessment  right ilium is anteriorly rotated   Palpation comment tenderness located on bil. medial  and lateral collateral ligament, medial to patella tendon in the joint; along the left forearm extensor muscles, around the lateral epicondyle; tightness located on the  brachioradialis                     [facility] Adult PT Treatment/Exercise - 08/13/17 0001      Manual Therapy   Manual Therapy Soft tissue mobilization;Muscle Energy Technique   Muscle Energy Technique correct right ilium                PT Education - 08/13/17 1108    Education provided Yes   Education Details knee exercises, elbow stretches, left UE gentle strengthening   Person(s) Educated Patient   Methods Explanation;Demonstration;Verbal cues;Handout   Comprehension Returned demonstration;Verbalized understanding          PT Short Term Goals - 08/10/17 1545      PT SHORT TERM GOAL #2   Title Pt will report L elbow pain to be reduced x 50% with functional activities   Time 3   Period Weeks   Status Achieved     PT SHORT TERM GOAL #3   Title Pt will be able to carry a dining room tray with bil elbow pain </= 3/10   Time 3   Period Weeks   Status Achieved     PT SHORT TERM GOAL #4   Title Pt will be able to write/type with 50% less difficulty   Time 3   Period Weeks   Status Achieved           PT Long Term Goals - 08/13/17 1123      PT LONG TERM GOAL #1   Title Pt will be able to drive with 86% less difficulty   Time 6   Period Weeks   Status On-going   Target Date 10/12/17     PT LONG TERM GOAL #2   Title Pt will be able to open jar with L hand with 75% less difficulty   Time 6   Period Weeks   Status On-going   Target Date 10/12/17     PT LONG TERM GOAL #3   Title Pt will be able to wring out a towel with 3/10 bil elbow pain   Time 6   Period Weeks   Status On-going   Target Date 10/12/17     PT LONG TERM GOAL #4   Title Pt will be able to demo improved  L wrist flexion to 50 degrees to assist with ADLs   Time 8   Period Weeks   Status On-going   Target  Date 10/12/17     PT LONG TERM GOAL #5   Title Pt will demo improved L wrist strength to >/= 4/5 all directions (elbow flexion too) to assist with household chores   Time 8   Period Weeks   Status On-going   Target Date 10/12/17     PT LONG TERM GOAL #6   Title Pt will be able to demo improved L dynamometer grip strength >/= 60 lbs to assist with lifting heavy items   Time 8   Period Weeks   Status On-going   Target Date 10/12/17     PT LONG TERM GOAL #7   Title able to sit cross legged with right knee pain decreaed >/= 75%   Time 8   Period Weeks   Status New   Target Date 10/12/17               Plan - 08/13/17 1115    Clinical Impression Statement Patient reports right knee pain started 1 month ago suddenly.  Patient right knee pain is 4/10 when she sits crossed legged.  Patient left elbow pain is 1/10 with gripping and texting.  Patient strength is 4/5 with left shoulder abduction, left elbow and left wrist extension, and left knee extension.  Palpable tenderness located medial right knee, left epicondyle, left lateral forearm muscles. Patient will benefit from skilled therapy to reduce pain and improve function.    History and Personal Factors relevant to plan of care: none   Clinical Presentation Stable   Clinical Presentation due to: stable condition   Clinical Decision Making Low   Rehab Potential Excellent   Clinical Impairments Affecting Rehab Potential NO DRY NEEDLING   PT Frequency 2x / week   PT Duration 6 weeks   PT Treatment/Interventions ADLs/Self Care Home Management;Cryotherapy;Electrical Stimulation;Iontophoresis 53m/ml Dexamethasone;Moist Heat;Ultrasound;Therapeutic exercise;Therapeutic activities;Functional mobility training;Patient/family education;Manual techniques;Passive range of motion;Taping   PT Next Visit Plan Ultrasound, soft tissue work to left shoulder, left arm and forearm;   Graston myofascial; ionto to medial right knee if MD signed note;  left knee strength; left UE strength   PT Home Exercise Plan progress as needed   Recommended Other Services renewal note sent to MD   Consulted and Agree with Plan of Care Patient      Patient will benefit from skilled therapeutic intervention in order to improve the following deficits and impairments:  Decreased activity tolerance, Decreased coordination, Decreased range of motion, Decreased mobility, Decreased strength, Impaired flexibility, Increased muscle spasms, Hypomobility, Impaired UE functional use, Improper body mechanics, Pain  Visit Diagnosis: Pain in left forearm - Plan: PT plan of care cert/re-cert  Muscle weakness (generalized) - Plan: PT plan of care cert/re-cert  Pain in right forearm - Plan: PT plan of care cert/re-cert  Acute pain of right knee - Plan: PT plan of care cert/re-cert     Problem List Patient Active Problem List   Diagnosis Date Noted  . Right knee pain 08/05/2017  . Left lateral epicondylitis 08/05/2017  . Low back pain 08/16/2015  . Right ankle pain 09/13/2012  . Right leg pain 08/08/2012  . Cyst of left ovary 12/26/2011  . Muscle spasm of calf 05/22/2011  . Menses, irregular 09/17/2010  . FOOT PAIN, BILATERAL 06/24/2010  . NUMBNESS 06/24/2010  . ANXIETY DISORDER, GENERALIZED 07/30/2009  . DIABETES MELLITUS, GESTATIONAL, HX  OF 07/30/2009  . ASTHMA 11/12/2008    Earlie Counts, PT 08/13/17 11:29 AM   [facility] [address], [place], [place], [address] Phone: [phone]   Fax:  [phone]  Name: REYA AURICH MRN: [id number] Date of Birth: October 09, 1971

## 2017-08-13 NOTE — Patient Instructions (Signed)
HIP: Flexion / KNEE: Extension, Straight Leg Raise    Raise leg, keeping knee straight. Perform slowly. _15__ reps per set, __2_ sets per day Tighten abdominals Copyright  VHI. All rights reserved.   Lay on back with rolled towel between knees and straighten right knee 15x2   CHEST: Doorway, Bilateral - Standing    Standing in doorway, place hands on wall with elbows bent at shoulder height. Lean forward. Hold _30__ seconds. _2__ reps per set, _1__ sets per day, 2___ days per week  Copyright  VHI. All rights reserved.   SHOULDER: Abduction Unilateral    Raise arm out and up palm up. Keep elbow straight. Do not shrug shoulders. Keep shoulder blades together _15__ reps per set, __2_ sets per day, _1__ days per week  Copyright  VHI. All rights reserved.  AROM: Wrist Extension    With right palm down, bend wrist up. Repeat _15___ times per set. Do __1__ sets per session. Do __2__ sessions per day.  Copyright  VHI. All rights reserved.  AROM: Forearm Pronation / Supination    With left arm in handshake position, slowly rotate palm down until stretch is felt. Relax. Then rotate palm up until stretch is felt. Repeat __15__ times per set. Do __2__ sets per session. Do _1___ sessions per day.  Copyright  VHI. All rights reserved.    Bring arm overhead, grasp elbow with opposite arm. Bend elbow to reach down the middle of your back. Hold 30 seconds. 1 times 2 times per day.     With elbow extended grasp with opposite hand the wrist and press to force the wrist into flexion/extension until stretch is felt in the forearm or wrist. Hold 30 sec. 1 time 4 times per day.   [facility] [address] [place],  [address] Phone # [phone] Fax [phone]

## 2017-08-16 ENCOUNTER — Other Ambulatory Visit: Payer: Self-pay | Admitting: *Deleted

## 2017-08-16 MED ORDER — CLONAZEPAM 0.25 MG PO TBDP: ORAL_TABLET | ORAL | 0 refills | Status: DC

## 2017-08-16 MED FILL — clonazePAM 0.25 MG TBDP: 0.25 | 3 days supply | Qty: 10 | Fill #0

## 2017-08-19 ENCOUNTER — Other Ambulatory Visit: Payer: Self-pay | Admitting: Family Medicine

## 2017-08-19 MED FILL — PROPRANOLOL HCL 10 MG TAB: 10 | 15 days supply | Qty: 30 | Fill #0

## 2017-08-20 ENCOUNTER — Ambulatory Visit: Payer: 59 | Admitting: Physical Therapy

## 2017-08-20 DIAGNOSIS — M25561 Pain in right knee: Secondary | ICD-10-CM | POA: Diagnosis not present

## 2017-08-20 DIAGNOSIS — M79632 Pain in left forearm: Secondary | ICD-10-CM | POA: Diagnosis not present

## 2017-08-20 DIAGNOSIS — M6281 Muscle weakness (generalized): Secondary | ICD-10-CM | POA: Diagnosis not present

## 2017-08-20 DIAGNOSIS — M79631 Pain in right forearm: Secondary | ICD-10-CM | POA: Diagnosis not present

## 2017-08-20 NOTE — Patient Instructions (Signed)
   Abduction: Clam (Eccentric) - Side-Lying   Lie on side with knees bent. Lift top knee, keeping feet together. Keep trunk steady. Slowly lower for 3-5 seconds. _10-15__ reps per set, _1__ sets per day, _7__ days per week. Add red band when you achieve __25 _ repetitions.  Copyright  VHI. All rights reserved.    Elbow exercises:  1# weight:  Lift wrist up toward the ceiling then slowly lower 5-10 reps to start  Also:  Rotate arm up then slowly lower 5-10 reps    Ruben Im PT [facility] [address] [place], [place] [address] Phone # [phone] Fax [phone]

## 2017-08-20 NOTE — Therapy (Signed)
[facility] [address], [place], [place], [address] Phone: [phone]   Fax:  [phone]  Physical Therapy Treatment  Patient Details  Name: Sue [name] MRN: [id number] Date of Birth: November 28, 1970 Referring Provider: Dr. Aundria Mems (primary doctor)  Encounter Date: 08/20/2017      PT End of Session - 08/20/17 1231    Visit Number 12   Date for PT Re-Evaluation 10/20/17   Authorization Type UMR   PT Start Time 0800   PT Stop Time 0844   PT Time Calculation (min) 44 min   Activity Tolerance Patient tolerated treatment well      Past Medical History:  Diagnosis Date  . Anxiety   . Asthma   . Basal cell carcinoma of skin of face 2007   Dr. Delman Cheadle    Past Surgical History:  Procedure Laterality Date  . MOHS SURGERY     removal of basal cell carcinoma    There were no vitals filed for this visit.      Subjective Assessment - 08/20/17 0807    Subjective My elbow is feeling better so I haven't been using the brace as much.  95% better.  New onset of right medial knee pain with internal rotation.  No pain with walking.  Hurts when sitting cross legged.     Currently in Pain? Yes   Pain Score 1    Pain Location Knee   Pain Orientation Right   Pain Score 2   Pain Location Elbow   Pain Orientation Left                         [facility] Adult PT Treatment/Exercise - 08/20/17 0001      Therapeutic Activites    Therapeutic Activities ADL's   ADL's sit to stand with patellofemoral alignment;  squatting with PF align     Elbow Exercises   Forearm Supination Strengthening;Left   Forearm Pronation Limitations 1# eccentric pronation   Wrist Extension Strengthening;Left   Bar Weights/Barbell (Wrist Extension) 1 lb     Knee/Hip Exercises: Aerobic   Nustep L1 7 min while discussing current status and response to treatment      Knee/Hip Exercises: Sidelying   Clams bil with and  without red and 10x each     Iontophoresis   Type of Iontophoresis Dexamethasone   Location right medial knee   Dose 40 ma/ml #1    Time 4-6 hour patch                PT Education - 08/20/17 0846    Education provided Yes   Education Details clams with red band; eccentric 1# wrist flex/ext and pronation/supination low reps;  patient has ionto info from previous use   Person(s) Educated Patient   Methods Explanation;Demonstration;Handout   Comprehension Verbalized understanding;Returned demonstration          PT Short Term Goals - 08/20/17 1237      PT SHORT TERM GOAL #1   Title Pt will be I with advanced HEP to assist with bil lateral epicondylitis pain to assist with work duties   Status Achieved     PT [place] #2   Title Pt will report L elbow pain to be reduced x 50% with functional activities   Status Achieved     PT SHORT TERM GOAL #3   Title Pt will be able to carry a dining room tray with bil  elbow pain </= 3/10   Status Achieved     PT SHORT TERM GOAL #4   Title Pt will be able to write/type with 50% less difficulty   Status Achieved           PT Long Term Goals - 08/20/17 1237      PT LONG TERM GOAL #1   Title Pt will be able to drive with 16% less difficulty   Time 6   Period Weeks   Status On-going     PT LONG TERM GOAL #2   Title Pt will be able to open jar with L hand with 75% less difficulty   Time 6   Period Weeks   Status On-going     PT LONG TERM GOAL #3   Title Pt will be able to wring out a towel with 3/10 bil elbow pain   Time 6   Period Weeks   Status On-going     PT LONG TERM GOAL #4   Title Pt will be able to demo improved L wrist flexion to 50 degrees to assist with ADLs   Time 8   Period Weeks   Status On-going     PT LONG TERM GOAL #5   Title Pt will demo improved L wrist strength to >/= 4/5 all directions (elbow flexion too) to assist with household chores   Time 8   Period Weeks   Status On-going      PT LONG TERM GOAL #6   Title Pt will be able to demo improved L dynamometer grip strength >/= 60 lbs to assist with lifting heavy items   Time 8   Period Weeks   Status On-going     PT LONG TERM GOAL #7   Title able to sit cross legged with right knee pain decreaed >/= 75%   Time 8   Period Weeks   Status On-going               Plan - 08/20/17 1232    Clinical Impression Statement The patient demonstrates right > left medial knee collapse (excessive hip adduction/internal rotation) with squatting and step downs which may be contributing to medial knee pain and point tenderness.  Patient reports her elbow pain is 95% better and she is able to her ROM exercises without pain although she feels as if she should continue wearing her elbow brace a bit longer for function.  Initiated low reps of eccentric strengthening.     Rehab Potential Excellent   Clinical Impairments Affecting Rehab Potential NO DRY NEEDLING   PT Frequency 2x / week   PT Duration 6 weeks   PT Treatment/Interventions ADLs/Self Care Home Management;Cryotherapy;Electrical Stimulation;Iontophoresis 28m/ml Dexamethasone;Moist Heat;Ultrasound;Therapeutic exercise;Therapeutic activities;Functional mobility training;Patient/family education;Manual techniques;Passive range of motion;Taping   PT Next Visit Plan continue ionto to right medial knee;  gluteal strengthening;  reinforce patellofemoral alignment;  assess response to 1# wrist eccentrics      Patient will benefit from skilled therapeutic intervention in order to improve the following deficits and impairments:  Decreased activity tolerance, Decreased coordination, Decreased range of motion, Decreased mobility, Decreased strength, Impaired flexibility, Increased muscle spasms, Hypomobility, Impaired UE functional use, Improper body mechanics, Pain  Visit Diagnosis: Pain in left forearm  Muscle weakness (generalized)  Acute pain of right knee     Problem  List Patient Active Problem List   Diagnosis Date Noted  . Right knee pain 08/05/2017  . Left lateral epicondylitis 08/05/2017  . Low back pain  08/16/2015  . Right ankle pain 09/13/2012  . Right leg pain 08/08/2012  . Cyst of left ovary 12/26/2011  . Muscle spasm of calf 05/22/2011  . Menses, irregular 09/17/2010  . FOOT PAIN, BILATERAL 06/24/2010  . NUMBNESS 06/24/2010  . ANXIETY DISORDER, GENERALIZED 07/30/2009  . DIABETES MELLITUS, GESTATIONAL, HX OF 07/30/2009  . ASTHMA 11/12/2008   Ruben Im, PT 08/20/17 12:38 PM Phone: [phone] Fax: [phone] Alvera Singh 08/20/2017, 12:38 PM  [facility] [address] [place], [place], [address] Phone: [phone]   Fax:  [phone]  Name: Sue [name] MRN: [id number] Date of Birth: Mar 24, 1971

## 2017-08-25 ENCOUNTER — Encounter: Payer: 59 | Admitting: Physical Therapy

## 2017-08-27 ENCOUNTER — Ambulatory Visit: Payer: 59 | Attending: Osteopathic Medicine | Admitting: Physical Therapy

## 2017-08-27 DIAGNOSIS — M6281 Muscle weakness (generalized): Secondary | ICD-10-CM | POA: Diagnosis not present

## 2017-08-27 DIAGNOSIS — M25561 Pain in right knee: Secondary | ICD-10-CM | POA: Insufficient documentation

## 2017-08-27 DIAGNOSIS — M79632 Pain in left forearm: Secondary | ICD-10-CM | POA: Insufficient documentation

## 2017-08-27 DIAGNOSIS — M79631 Pain in right forearm: Secondary | ICD-10-CM | POA: Insufficient documentation

## 2017-08-27 NOTE — Therapy (Signed)
[facility] [address] [place], [place], [address] Phone: [phone]   Fax:  [phone]  Physical Therapy Treatment  Patient Details  Name: Sue [name] MRN: [id number] Date of Birth: 10-03-1971 Referring Provider: Dr. Aundria Mems (primary doctor)  Encounter Date: 08/27/2017      PT End of Session - 08/27/17 0851    Visit Number 13   Date for PT Re-Evaluation 10/20/17   Authorization Type UMR   PT Start Time 0800   PT Stop Time 0843   PT Time Calculation (min) 43 min   Activity Tolerance Patient tolerated treatment well      Past Medical History:  Diagnosis Date  . Anxiety   . Asthma   . Basal cell carcinoma of skin of face 2007   Dr. Delman Cheadle    Past Surgical History:  Procedure Laterality Date  . MOHS SURGERY     removal of basal cell carcinoma    There were no vitals filed for this visit.      Subjective Assessment - 08/27/17 0801    Subjective I have a little bit of a migraine this morning.  I took some medicine.  I've been out of town so I haven't been able to do ex's very much.     Currently in Pain? Yes   Pain Score 1    Pain Location Elbow   Pain Orientation Left   Pain Type Chronic pain   Pain Score 0   Pain Location Knee   Pain Orientation Right                         [facility] Adult PT Treatment/Exercise - 08/27/17 0001      Therapeutic Activites    ADL's sit to stand with patellofemoral alignment;  squatting with PF align     Neuro Re-ed    Neuro Re-ed Details  elbow extension stretching in doorway     Elbow Exercises   Forearm Supination Strengthening;Left   Wrist Extension Strengthening;Left   Bar Weights/Barbell (Wrist Extension) 1 lb   Wrist Extension Limitations towel twist 10x     Knee/Hip Exercises: Aerobic   Nustep L 1 4 min     Knee/Hip Exercises: Standing   Step Down Right;Left;10 reps;Step Height: 2"     Knee/Hip Exercises:  Sidelying   Clams bil 15x      Iontophoresis   Type of Iontophoresis Dexamethasone   Location right medial knee   Dose 40 ma/ml #2    Time 4-6 hour patch     Standing gluteus medius stand tall ex 10x right/left           PT Education - 08/27/17 0846    Education provided Yes   Education Details 2 inches step downs, eccentrics elbow, glute med stand tall ex;  elbow extension ROM   Person(s) Educated Patient   Methods Explanation;Demonstration;Handout   Comprehension Verbalized understanding;Returned demonstration          PT Short Term Goals - 08/27/17 1237      PT SHORT TERM GOAL #1   Title Pt will be I with advanced HEP to assist with bil lateral epicondylitis pain to assist with work duties   Status Achieved     PT [place] #2   Title Pt will report L elbow pain to be reduced x 50% with functional activities   Status Achieved     PT SHORT TERM GOAL #3  Title Pt will be able to carry a dining room tray with bil elbow pain </= 3/10   Status Achieved     PT SHORT TERM GOAL #4   Title Pt will be able to write/type with 50% less difficulty   Status Achieved           PT Long Term Goals - 08/27/17 1237      PT LONG TERM GOAL #1   Title Pt will be able to drive with 83% less difficulty   Time 6   Period Weeks   Status On-going     PT LONG TERM GOAL #2   Title Pt will be able to open jar with L hand with 75% less difficulty   Time 6   Period Weeks   Status On-going     PT LONG TERM GOAL #3   Title Pt will be able to wring out a towel with 3/10 bil elbow pain   Time 6   Period Weeks   Status On-going     PT LONG TERM GOAL #4   Title Pt will be able to demo improved L wrist flexion to 50 degrees to assist with ADLs   Time 8   Period Weeks   Status On-going     PT LONG TERM GOAL #5   Title Pt will demo improved L wrist strength to >/= 4/5 all directions (elbow flexion too) to assist with household chores   Time 8   Period Weeks    Status On-going     PT LONG TERM GOAL #6   Title Pt will be able to demo improved L dynamometer grip strength >/= 60 lbs to assist with lifting heavy items   Time 8   Period Weeks   Status On-going     PT LONG TERM GOAL #7   Title able to sit cross legged with right knee pain decreaed >/= 75%   Time 8   Period Weeks               Plan - 08/27/17 1233    Clinical Impression Statement The patient reports no changes in elbow pain or knee pain this week but states she has not been able to do her home ex's because of attending a work conference this week.  Continued patellofemoral alignment cues needed for sit to stand and step downs secondary to excessive hip adduction/internal rotation.  She may benefit from McConnell taping if medial knee pain persists.     Rehab Potential Excellent   Clinical Impairments Affecting Rehab Potential NO DRY NEEDLING   PT Frequency 2x / week   PT Duration 6 weeks   PT Treatment/Interventions ADLs/Self Care Home Management;Cryotherapy;Electrical Stimulation;Iontophoresis 42m/ml Dexamethasone;Moist Heat;Ultrasound;Therapeutic exercise;Therapeutic activities;Functional mobility training;Patient/family education;Manual techniques;Passive range of motion;Taping   PT Next Visit Plan continue ionto to right medial knee;   gluteal strengthening;  reinforce patellofemoral alignment;  try McConnell tape as needed;  wrist eccentrics      Patient will benefit from skilled therapeutic intervention in order to improve the following deficits and impairments:  Decreased activity tolerance, Decreased coordination, Decreased range of motion, Decreased mobility, Decreased strength, Impaired flexibility, Increased muscle spasms, Hypomobility, Impaired UE functional use, Improper body mechanics, Pain  Visit Diagnosis: Pain in left forearm  Muscle weakness (generalized)  Acute pain of right knee  Pain in right forearm     Problem List Patient Active Problem List    Diagnosis Date Noted  . Right knee pain 08/05/2017  .  Left lateral epicondylitis 08/05/2017  . Low back pain 08/16/2015  . Right ankle pain 09/13/2012  . Right leg pain 08/08/2012  . Cyst of left ovary 12/26/2011  . Muscle spasm of calf 05/22/2011  . Menses, irregular 09/17/2010  . FOOT PAIN, BILATERAL 06/24/2010  . NUMBNESS 06/24/2010  . ANXIETY DISORDER, GENERALIZED 07/30/2009  . DIABETES MELLITUS, GESTATIONAL, HX OF 07/30/2009  . ASTHMA 11/12/2008   Ruben Im, PT 08/27/17 12:39 PM Phone: [phone] Fax: [phone]  Alvera Singh 08/27/2017, 12:38 PM  [facility] [address] [place], [place], [address] Phone: [phone]   Fax:  [phone]  Name: Sue [name] MRN: [id number] Date of Birth: Aug 19, 1971

## 2017-08-27 NOTE — Patient Instructions (Signed)
  2 inch step downs in front of the mirror.  Watch center of the knee cap aligns with point of shoe/2nd toe 5 reps at a time 2-3x/day    Stand tall ex:  Stand on right leg, left toes can balance if needed and stand up as straight as you can (activating gluteus medius)  10x   Elbow:  Work on sustained stretch at the doorway, in bed, at the table to assist it going straight   Hold at least 20 sec or longer   3 reps   Motorcycle ex twist a towel forward and then release slowly 10x    Ruben Im PT [facility] [address] [place], [place] [address] Phone # [phone] Fax [phone]

## 2017-08-31 ENCOUNTER — Encounter: Payer: Self-pay | Admitting: Physical Therapy

## 2017-08-31 ENCOUNTER — Ambulatory Visit: Payer: 59 | Admitting: Physical Therapy

## 2017-08-31 DIAGNOSIS — M6281 Muscle weakness (generalized): Secondary | ICD-10-CM | POA: Diagnosis not present

## 2017-08-31 DIAGNOSIS — M79632 Pain in left forearm: Secondary | ICD-10-CM | POA: Diagnosis not present

## 2017-08-31 DIAGNOSIS — M25561 Pain in right knee: Secondary | ICD-10-CM

## 2017-08-31 DIAGNOSIS — M79631 Pain in right forearm: Secondary | ICD-10-CM | POA: Diagnosis not present

## 2017-08-31 NOTE — Therapy (Addendum)
[facility] [address], [place], [place], [address] Phone: [phone]   Fax:  [phone]  Physical Therapy Treatment  Patient Details  Name: PRIM MORACE MRN: [id number] Date of Birth: 1971/05/13 Referring Provider: Dr. Aundria Mems (primary doctor)  Encounter Date: 08/31/2017      PT End of Session - 08/31/17 0856    Visit Number 14   Date for PT Re-Evaluation 10/20/17   Authorization Type UMR   PT Start Time 0806   PT Stop Time 0845   PT Time Calculation (min) 39 min   Activity Tolerance Patient tolerated treatment well   Behavior During Therapy [facility] for tasks assessed/performed      Past Medical History:  Diagnosis Date  . Anxiety   . Asthma   . Basal cell carcinoma of skin of face 2007   Dr. Delman Cheadle    Past Surgical History:  Procedure Laterality Date  . MOHS SURGERY     removal of basal cell carcinoma    There were no vitals filed for this visit.      Subjective Assessment - 08/31/17 0809    Subjective My knee hurts more than the elbow. My elbow is 90% better.  My knee is a little better and is 15% better.  I have specific motions that cause the pain.    Pertinent History R lateral epicondylitis x 1 year but pain is mild; pt sits at a computer all day. Issued wrist flex/ext stretches by MD. L shoulder occasionally pops.    Limitations Lifting;Writing;House hold activities   How long can you sit comfortably? no problem   How long can you stand comfortably? no problem   How long can you walk comfortably? no problem   Diagnostic tests no diagnostic testing performed   Patient Stated Goals to be able to do my activities without pain; decrease right knee pain   Currently in Pain? Yes   Pain Score 1    Pain Location Elbow   Pain Orientation Left   Pain Descriptors / Indicators Tender   Pain Type Chronic pain   Pain Onset More than a month ago   Pain Frequency Intermittent   Aggravating Factors  daily activities   Pain Relieving Factors stretching the elbow   Multiple Pain Sites Yes   Pain Score 2   Pain Location Knee   Pain Orientation Right   Pain Descriptors / Indicators Sharp   Pain Type Acute pain   Pain Onset 1 to 4 weeks ago   Pain Frequency Intermittent   Aggravating Factors  sits cross legged medial knee pain; sit with right leg tucked under buttocks   Pain Relieving Factors change position            [facility] PT Assessment - 08/31/17 0001      Strength   Overall Strength Comments grip strength left 54#   Left Elbow Flexion 5/5   Left Elbow Extension 4+/5   Left Wrist Extension 5/5                     [facility] Adult PT Treatment/Exercise - 08/31/17 0001      Elbow Exercises   Wrist Extension Strengthening;Left   Bar Weights/Barbell (Wrist Extension) 1 lb     Knee/Hip Exercises: Aerobic   Recumbent Bike level 3 5 min     Knee/Hip Exercises: Standing   Heel Raises Right;15 reps   Heel Raises Limitations on step   Lateral Step Up  Right;15 reps;Hand Hold: 0   Lateral Step Up Limitations squeeze abdominals and buttocks   Step Down Right;20 reps   Step Down Limitations VC to not let knee go inward, contract abdominals, upright posture   Wall Squat 10 reps   Wall Squat Limitations while doing the towel twist   Other Standing Knee Exercises side step with red band on knees     Modalities   Modalities Iontophoresis     Iontophoresis   Type of Iontophoresis Dexamethasone   Location right medial knee   Dose 40 ma/ml #3  lot #[id number]   Time 4-6 hour patch     Manual Therapy   Manual Therapy Soft tissue mobilization   Soft tissue mobilization medial right patella tendon                  PT Short Term Goals - 08/27/17 1237      PT SHORT TERM GOAL #1   Title Pt will be I with advanced HEP to assist with bil lateral epicondylitis pain to assist with work duties   Status Achieved     PT SHORT TERM GOAL #2    Title Pt will report L elbow pain to be reduced x 50% with functional activities   Status Achieved     PT SHORT TERM GOAL #3   Title Pt will be able to carry a dining room tray with bil elbow pain </= 3/10   Status Achieved     PT SHORT TERM GOAL #4   Title Pt will be able to write/type with 50% less difficulty   Status Achieved           PT Long Term Goals - 08/31/17 0817      PT LONG TERM GOAL #1   Title Pt will be able to drive with 52% less difficulty   Baseline 100% better   Time 6   Period Weeks   Status Achieved     PT LONG TERM GOAL #2   Title Pt will be able to open jar with L hand with 75% less difficulty   Baseline 75% easier   Time 6   Period Weeks   Status Achieved     PT LONG TERM GOAL #3   Title Pt will be able to wring out a towel with 3/10 bil elbow pain   Baseline 1/10   Time 6   Period Weeks   Status Achieved     PT LONG TERM GOAL #4   Title Pt will be able to demo improved L wrist flexion to 50 degrees to assist with ADLs   Baseline 65 degrees   Time 8   Period Weeks   Status Achieved     PT LONG TERM GOAL #5   Title Pt will demo improved L wrist strength to >/= 4/5 all directions (elbow flexion too) to assist with household chores   Time 8   Period Weeks   Status Achieved     PT LONG TERM GOAL #6   Title Pt will be able to demo improved L dynamometer grip strength >/= 60 lbs to assist with lifting heavy items   Baseline 54#   Time 8   Period Weeks   Status On-going               Plan - 08/31/17 0856    Clinical Impression Statement Patient is able to drive with 77% decreased pain in left elbow.  Patient pain with activities in left  elbow is 1/10.  Patient has met left elbow goals.  Patient has tenderness located on medial right patella tendon and increased pain with squatting.  Patient left wrist strength has increased to 54#.  Left wrist strength is 5/5.  LEft wrist fleixon is 65 degress passively.  Patient left elbow  extension is 4/5 and flexion is 5/5 with pain. Patient will benefit from skilled therapy to decrease pain and increase strength.    Rehab Potential Excellent   Clinical Impairments Affecting Rehab Potential NO DRY NEEDLING   PT Treatment/Interventions ADLs/Self Care Home Management;Cryotherapy;Electrical Stimulation;Iontophoresis 56m/ml Dexamethasone;Moist Heat;Ultrasound;Therapeutic exercise;Therapeutic activities;Functional mobility training;Patient/family education;Manual techniques;Passive range of motion;Taping   PT Next Visit Plan continue ionto to right medial knee;   gluteal strengthening;  reinforce patellofemoral alignment;  try McConnell tape as needed;  wrist eccentrics   PT Home Exercise Plan progress as needed   Recommended Other Services renewal note is signed by MD   Consulted and Agree with Plan of Care Patient      Patient will benefit from skilled therapeutic intervention in order to improve the following deficits and impairments:  Decreased activity tolerance, Decreased coordination, Decreased range of motion, Decreased mobility, Decreased strength, Impaired flexibility, Increased muscle spasms, Hypomobility, Impaired UE functional use, Improper body mechanics, Pain  Visit Diagnosis: Pain in left forearm  Muscle weakness (generalized)  Acute pain of right knee     Problem List Patient Active Problem List   Diagnosis Date Noted  . Right knee pain 08/05/2017  . Left lateral epicondylitis 08/05/2017  . Low back pain 08/16/2015  . Right ankle pain 09/13/2012  . Right leg pain 08/08/2012  . Cyst of left ovary 12/26/2011  . Muscle spasm of calf 05/22/2011  . Menses, irregular 09/17/2010  . FOOT PAIN, BILATERAL 06/24/2010  . NUMBNESS 06/24/2010  . ANXIETY DISORDER, GENERALIZED 07/30/2009  . DIABETES MELLITUS, GESTATIONAL, HX OF 07/30/2009  . ASTHMA 11/12/2008    CEarlie Counts PT 08/31/17 9:02 AM   [facility] [address] [place] [address]Phone: [phone]  Fax:  [phone] Name: MNICHOLETTE DOLSONMRN: [id number] of Birth: [birth year] PHYSICAL THERAPY DISCHARGE SUMMARY  Visits from Start of Care: 14  Current functional level related to goals / functional outcomes: See above. Unable to further assess patient due to not returning since last visit.    Remaining deficits: See above.    Education / Equipment: HEP Plan: Patient agrees to discharge.  Patient goals were partially met. Patient is being discharged due to not returning since the last visit.  Thank you for the referral. CEarlie Counts PT 10/06/17 4:54 PM  ?????

## 2017-09-02 ENCOUNTER — Ambulatory Visit: Payer: 59 | Admitting: Sports Medicine

## 2017-09-03 ENCOUNTER — Encounter: Payer: 59 | Admitting: Physical Therapy

## 2017-09-08 ENCOUNTER — Ambulatory Visit (INDEPENDENT_AMBULATORY_CARE_PROVIDER_SITE_OTHER): Payer: 59 | Admitting: Family Medicine

## 2017-09-08 ENCOUNTER — Encounter: Payer: Self-pay | Admitting: Family Medicine

## 2017-09-08 VITALS — BP 130/58 | HR 79 | Ht 66.0 in | Wt 174.0 lb

## 2017-09-08 DIAGNOSIS — J4540 Moderate persistent asthma, uncomplicated: Secondary | ICD-10-CM | POA: Diagnosis not present

## 2017-09-08 DIAGNOSIS — F411 Generalized anxiety disorder: Secondary | ICD-10-CM | POA: Diagnosis not present

## 2017-09-08 MED ORDER — SERTRALINE HCL 100 MG PO TABS: 100.0000 mg | ORAL_TABLET | Freq: Every day | ORAL | 0 refills | Status: DC

## 2017-09-08 MED ORDER — FLUTICASONE PROPIONATE HFA 110 MCG/ACT IN AERO: 2.0000 | INHALATION_SPRAY | Freq: Two times a day (BID) | RESPIRATORY_TRACT | 1 refills | Status: DC

## 2017-09-08 MED ORDER — CLONAZEPAM 0.25 MG PO TBDP: ORAL_TABLET | ORAL | 1 refills | Status: DC

## 2017-09-08 MED FILL — FLOVENT HFA 110 MCG INHALER: 110 | 30 days supply | Qty: 12 | Fill #0

## 2017-09-08 MED FILL — clonazePAM 0.25 MG TBDP: 0.25 | 3 days supply | Qty: 10 | Fill #0

## 2017-09-08 NOTE — Progress Notes (Signed)
   Subjective:    Patient ID: Sue [name], female    DOB: [birth year], 46 y.o.   MRN: [id number]  HPI 6 week f/u for asthma and mood.     Asthma - she is on Advair. She is doing really well overall. She has not had use her rescue inhaler and in fact hasn't even really been using her Advair very consistently. No recent flares or exacerbations.  Anxiety  - bumped her sertraline to 176m 6 weeks ago.  She hasn't been  Super consistant with taking it regularly. She still has a lot of stressors particularly at work. She says it's really more the anxiety of anticipation for things coming. She was able to finally schedule appoint is [place] [facility] [place] Her first appointment is next week.    Review of Systems     Objective:   Physical Exam  Constitutional: She is oriented to person, place, and time. She appears well-developed and well-nourished.  HENT:  Head: Normocephalic and atraumatic.  Cardiovascular: Normal rate, regular rhythm and normal heart sounds.   Pulmonary/Chest: Effort normal and breath sounds normal.  Neurological: She is alert and oriented to person, place, and time.  Skin: Skin is warm and dry.  Psychiatric: She has a normal mood and affect. Her behavior is normal.       Assessment & Plan:  Asthma - Doing much better. Will taper down from Advair to Flovent. New perception sent to pharmacy. Encouraged her to stick with that for about a month or even 2 if needed and at that point can taper off completely and just go back to albuterol as needed.  Anxiety - she wants to go back to the 100 mg sertraline and just try to work on taking it very consistently think that's perfectly reasonable. GAD 7 score of 12 today which is improved

## 2017-11-04 DIAGNOSIS — Z808 Family history of malignant neoplasm of other organs or systems: Secondary | ICD-10-CM | POA: Diagnosis not present

## 2017-11-04 DIAGNOSIS — L814 Other melanin hyperpigmentation: Secondary | ICD-10-CM | POA: Diagnosis not present

## 2017-11-04 DIAGNOSIS — D225 Melanocytic nevi of trunk: Secondary | ICD-10-CM | POA: Diagnosis not present

## 2017-11-04 DIAGNOSIS — L570 Actinic keratosis: Secondary | ICD-10-CM | POA: Diagnosis not present

## 2017-11-04 DIAGNOSIS — Z85828 Personal history of other malignant neoplasm of skin: Secondary | ICD-10-CM | POA: Diagnosis not present

## 2017-11-04 DIAGNOSIS — Z23 Encounter for immunization: Secondary | ICD-10-CM | POA: Diagnosis not present

## 2017-11-04 MED FILL — FLUOROURACIL 5% CREAM: 5 | 21 days supply | Qty: 40 | Fill #0

## 2017-11-04 MED FILL — clonazePAM 0.25 MG TBDP: 0.25 | 3 days supply | Qty: 10 | Fill #1

## 2017-12-09 ENCOUNTER — Ambulatory Visit (INDEPENDENT_AMBULATORY_CARE_PROVIDER_SITE_OTHER): Payer: No Typology Code available for payment source | Admitting: Family Medicine

## 2017-12-09 ENCOUNTER — Encounter: Payer: Self-pay | Admitting: Family Medicine

## 2017-12-09 VITALS — BP 133/61 | HR 85 | Ht 66.0 in | Wt 179.0 lb

## 2017-12-09 DIAGNOSIS — F411 Generalized anxiety disorder: Secondary | ICD-10-CM | POA: Diagnosis not present

## 2017-12-09 DIAGNOSIS — H9193 Unspecified hearing loss, bilateral: Secondary | ICD-10-CM

## 2017-12-09 DIAGNOSIS — J4520 Mild intermittent asthma, uncomplicated: Secondary | ICD-10-CM

## 2017-12-09 NOTE — Progress Notes (Signed)
Subjective:    CC: Asthma, and anxiety  HPI:  She is here today to follow-up for her asthma-we decreased her Advair down to Flovent at last follow-up in October.  She was doing really well at that point in time.  He is still doing fantastic.  And some days does not even use her Flovent twice a day.  Also follow-up for anxiety-currently on Zoloft 100 mg daily.  Her goal was to work on taking the medication more consistently.  She reports that overall she is been doing okay.  She feels like her overall level of generalized anxiety is better but now it seems to be that she is having more situational anxiety thing up will create a lot of anxiety for her.  She is not exactly sure when it is the best option to take the propranolol versus taking the is a pan and would like some advice on that.  She says that she would like to get back into therapy and counseling but has just been so busy with work.  She has not been exercising regularly and has been stress eating.  She knows she needs to get back on track.  Also has noticed some difficulty caring particularly while at home she will notice while watching TV or movies that she has to ask her family what the doctor said.  She feels like it is to the point where it starting to annoy the people around her.  She has not had any other specific ear symptoms with this.  She does report often getting some fullness in her ears and being unable to get her ears to pop when flying.  She does use a nasal steroid spray at different times throughout the year.  Past medical history, Surgical history, Family history not pertinant except as noted below, Social history, Allergies, and medications have been entered into the medical record, reviewed, and corrections made.   Review of Systems: No fevers, chills, night sweats, weight loss, chest pain, or shortness of breath.   Objective:    General: Well Developed, well nourished, and in no acute distress.  Neuro: Alert and  oriented x3, extra-ocular muscles intact, sensation grossly intact.  HEENT: Normocephalic, atraumatic  Skin: Warm and dry, no rashes. Cardiac: Regular rate and rhythm, no murmurs rubs or gallops, no lower extremity edema.  Respiratory: Clear to auscultation bilaterally. Not using accessory muscles, speaking in full sentences.   Impression and Recommendations:    Asthma -mild, intermittent-okay to discontinue the Flovent and just use the albuterol as needed.  Certainly if symptoms flare she can always restart the Flovent if needed.  She says really the fall is her worst time a year so we may need to do preventatively start Flovent in the fall.  Anxiety -we discussed using the propranolol for more situational anxiety and really to save the clonazepam for things like panic attacks.  Continue with Zoloft 100 mg.  She is happy with that particular regimen.  Hearing loss/decreased hearing ability-we will go ahead and refer for audiometry for further evaluation.  She may also have some element of eustachian tube dysfunction and may benefit from being on a nasal steroid spray more chronically.

## 2017-12-16 ENCOUNTER — Ambulatory Visit (INDEPENDENT_AMBULATORY_CARE_PROVIDER_SITE_OTHER): Payer: No Typology Code available for payment source | Admitting: Physician Assistant

## 2017-12-16 ENCOUNTER — Encounter: Payer: Self-pay | Admitting: Physician Assistant

## 2017-12-16 VITALS — BP 128/83 | HR 84 | Temp 98.7°F | Resp 14 | Wt 179.0 lb

## 2017-12-16 DIAGNOSIS — R69 Illness, unspecified: Secondary | ICD-10-CM | POA: Diagnosis not present

## 2017-12-16 DIAGNOSIS — J111 Influenza due to unidentified influenza virus with other respiratory manifestations: Secondary | ICD-10-CM

## 2017-12-16 MED ORDER — OSELTAMIVIR PHOSPHATE 75 MG PO CAPS: 75.0000 mg | ORAL_CAPSULE | Freq: Two times a day (BID) | ORAL | 0 refills | Status: DC

## 2017-12-16 MED ORDER — PREDNISONE 50 MG PO TABS: 50.0000 mg | ORAL_TABLET | Freq: Every day | ORAL | 0 refills | Status: DC

## 2017-12-16 NOTE — Progress Notes (Signed)
HPI:                                                                Sue [name] is a 47 y.o. female who presents to [place]: [place] today for cough/fever  Cough  This is a new problem. The current episode started in the past 7 days. The problem has been gradually worsening. The cough is productive of purulent sputum. Associated symptoms include a fever (101-102 x 48 hours), myalgias, rhinorrhea, a sore throat and sweats. She has tried a beta-agonist inhaler (NSAID) for the symptoms. The treatment provided moderate relief. Her past medical history is significant for asthma.  Using rescue inhaler once a day.    Depression screen [facility] 2/9 12/09/2017 09/08/2017 07/23/2017 02/12/2017  Decreased Interest 0 0 1 0  Down, Depressed, Hopeless 0 0 0 0  PHQ - 2 Score 0 0 1 0  Altered sleeping 1 - - -  Tired, decreased energy 1 - - -  Change in appetite 3 - - -  Feeling bad or failure about yourself  1 - - -  Trouble concentrating 0 - - -  Moving slowly or fidgety/restless 0 - - -  Suicidal thoughts 0 - - -  PHQ-9 Score 6 - - -  Difficult doing work/chores Not difficult at all - - -    GAD 7 : Generalized Anxiety Score 12/09/2017 09/08/2017 09/08/2017 02/12/2017  Nervous, Anxious, on Edge 2 3 3 2   Control/stop worrying 1 1 1  0  Worry too much - different things 1 2 2  0  Trouble relaxing 0 2 2 1   Restless 0 0 0 0  Easily annoyed or irritable 0 1 1 1   Afraid - awful might happen 3 3 0 0  Total GAD 7 Score 7 12 9 4   Anxiety Difficulty Not difficult at all - - Not difficult at all      Past Medical History:  Diagnosis Date  . Anxiety   . Asthma   . Basal cell carcinoma of skin of face 2007   Dr. Delman Cheadle   Past Surgical History:  Procedure Laterality Date  . MOHS SURGERY     removal of basal cell carcinoma   Social History   Tobacco Use  . Smoking status: Never Smoker  . Smokeless tobacco: Never Used  Substance Use Topics  . Alcohol  use: Yes    Comment: couple drinks per week   family history includes Depression in her sister; Diabetes in her maternal grandfather; Hyperlipidemia in her father; Hypertension in her father and mother.    ROS: negative except as noted in the HPI  Medications: Current Outpatient Medications  Medication Sig Dispense Refill  . clonazePAM (KLONOPIN) 0.25 MG disintegrating tablet Take one tab by mouth one to three times daily as needed for anxiety 10 tablet 1  . fluticasone (FLOVENT HFA) 110 MCG/ACT inhaler Inhale 2 puffs into the lungs 2 (two) times daily. 1 Inhaler 1  . propranolol (INDERAL) 10 MG tablet TAKE 1 TABLET BY MOUTH TWICE DAILY AS NEEDED FOR PUBLIC SPEAKING 30 tablet 0  . sertraline (ZOLOFT) 100 MG tablet Take 1 tablet (100 mg total) by mouth daily. 1 tablet 0  . Spacer/Aero Chamber [employer] Use as  directed with inhaler 1 each 1  . VENTOLIN HFA 108 (90 Base) MCG/ACT inhaler INHALE 2 PUFFS BY MOUTH INTO THE LUNGS EVERY 4 HOURS AS NEEDED FOR WHEEZING. 54 g 1   No current facility-administered medications for this visit.    No Known Allergies     Objective:  BP 128/83   Pulse 84   Temp 98.7 F (37.1 C) (Oral)   Wt 179 lb (81.2 kg)   SpO2 96%   BMI 28.89 kg/m  Gen:  alert, ill-appearing, no distress, appropriate for age 33: head normocephalic without obvious abnormality, conjunctiva and cornea clear, left TM retracted, right TM clear, nasal mucosa edematous with rhinorrhea, neck supple, no adenopathy, trachea midline Pulm: Normal work of breathing, normal phonation, clear to auscultation bilaterally, no wheezes, rales or rhonchi CV: Normal rate, regular rhythm, s1 and s2 distinct, no murmurs, clicks or rubs  Neuro: alert and oriented x 3, no tremor MSK: extremities atraumatic, normal gait and station Skin: intact, no rashes on exposed skin, no jaundice, no cyanosis     No results found for this or any previous visit (from the past 72 hour(s)). No results  found.    Assessment and Plan: 47 y.o. female with   1. Influenza-like illness - sudden onset fever, myalgia, and URI symptoms. She is in the window for Tamiflu. Will treat empirically. SpO2 96% on RA, no evidence of asthma exacerbation. Recommend Prednisone if she is using rescue twice a day or more - oseltamivir (TAMIFLU) 75 MG capsule; Take 1 capsule (75 mg total) by mouth 2 (two) times daily.  Dispense: 10 capsule; Refill: 0 - predniSONE (DELTASONE) 50 MG tablet; Take 1 tablet (50 mg total) by mouth daily.  Dispense: 5 tablet; Refill: 0   Patient education and anticipatory guidance given Patient agrees with treatment plan Follow-up as needed if symptoms worsen or fail to improve  Darlyne Russian PA-C

## 2017-12-16 NOTE — Patient Instructions (Signed)

## 2018-01-10 ENCOUNTER — Other Ambulatory Visit: Payer: Self-pay | Admitting: *Deleted

## 2018-01-10 DIAGNOSIS — F411 Generalized anxiety disorder: Secondary | ICD-10-CM

## 2018-01-10 MED ORDER — CLONAZEPAM 0.25 MG PO TBDP: ORAL_TABLET | ORAL | 1 refills | Status: DC

## 2018-01-10 MED FILL — clonazePAM 0.25 MG TBDP: 0.25 | 3 days supply | Qty: 10 | Fill #0

## 2018-01-10 NOTE — Progress Notes (Signed)
rx routed to pcp for signature. Pt checked in [place] and last refill was 11/09/17 #10.Sue [name], [place]

## 2018-02-14 ENCOUNTER — Other Ambulatory Visit: Payer: Self-pay | Admitting: Family Medicine

## 2018-02-23 MED FILL — SERTRALINE HCL 100 MG TAB: 100 | 90 days supply | Qty: 90 | Fill #0

## 2018-03-07 ENCOUNTER — Encounter: Payer: Self-pay | Admitting: Family Medicine

## 2018-03-07 ENCOUNTER — Ambulatory Visit (INDEPENDENT_AMBULATORY_CARE_PROVIDER_SITE_OTHER): Payer: No Typology Code available for payment source | Admitting: Family Medicine

## 2018-03-07 ENCOUNTER — Other Ambulatory Visit: Payer: Self-pay | Admitting: Family Medicine

## 2018-03-07 VITALS — BP 143/93 | HR 73 | Ht 66.0 in | Wt 176.8 lb

## 2018-03-07 DIAGNOSIS — J4520 Mild intermittent asthma, uncomplicated: Secondary | ICD-10-CM

## 2018-03-07 DIAGNOSIS — F411 Generalized anxiety disorder: Secondary | ICD-10-CM | POA: Diagnosis not present

## 2018-03-07 DIAGNOSIS — R42 Dizziness and giddiness: Secondary | ICD-10-CM | POA: Diagnosis not present

## 2018-03-07 DIAGNOSIS — H938X3 Other specified disorders of ear, bilateral: Secondary | ICD-10-CM | POA: Diagnosis not present

## 2018-03-07 DIAGNOSIS — Z1231 Encounter for screening mammogram for malignant neoplasm of breast: Secondary | ICD-10-CM

## 2018-03-07 MED ORDER — HYDROXYZINE HCL 25 MG PO TABS: 25.0000 mg | ORAL_TABLET | Freq: Three times a day (TID) | ORAL | 3 refills | Status: DC | PRN

## 2018-03-07 MED ORDER — CLONAZEPAM 0.25 MG PO TBDP: ORAL_TABLET | ORAL | 3 refills | Status: DC

## 2018-03-07 MED ORDER — PREDNISONE 20 MG PO TABS: 40.0000 mg | ORAL_TABLET | Freq: Every day | ORAL | 0 refills | Status: DC

## 2018-03-07 MED ORDER — SERTRALINE HCL 100 MG PO TABS: 150.0000 mg | ORAL_TABLET | Freq: Every day | ORAL | 1 refills | Status: DC

## 2018-03-07 MED ORDER — FLUTICASONE PROPIONATE 50 MCG/ACT NA SUSP: 1.0000 | Freq: Every day | NASAL | 6 refills | Status: DC

## 2018-03-07 MED FILL — hydrOXYzine HCL 25 MG TABS: 25 | 10 days supply | Qty: 30 | Fill #0

## 2018-03-07 MED FILL — predniSONE 20 MG TABS: 20 | 5 days supply | Qty: 10 | Fill #0

## 2018-03-07 NOTE — Progress Notes (Signed)
Subjective:    CC: Ashtma  HPI:  Follow-up asthma-she was doing well on Flovent alone when I last saw her.  Now we moved into the spring. She is doing well but has been having some pressure in her ears. They feel stopped up and she has felt  A little fuzzy headed.  She is taking an OTC allergy med but not sure it is helping.   Follow-up generalized anxiety disorder-currently on Zoloft.  She was working on trying to take her medication morewas working on trying to take her medication more  consistently.  Having a hard time concentrating. She just doesn't feel well overall the last several weeks.   Back in the fall she passed out after having dry needling and say she feels like lately her anxiety around passing out again and with needles has been increasing to the point that she gets super anxious when thinking about it. Even conversations as work that are medication related to needles, etc are really causing high levels of anxiety.  She has made an appt  In [place] for an office that specialized in phobias.  Appt not for 4 more weeks.    Past medical history, Surgical history, Family history not pertinant except as noted below, Social history, Allergies, and medications have been entered into the medical record, reviewed, and corrections made.   Review of Systems: No fevers, chills, night sweats, weight loss, chest pain, or shortness of breath.   Objective:    General: Well Developed, well nourished, and in no acute distress.  Neuro: Alert and oriented x3, extra-ocular muscles intact, sensation grossly intact.  HEENT: Normocephalic, atraumatic, OP is clear and TM on the left is retracted. No erythema. Question some fluid on the right with distorted light reflex as well.  Skin: Warm and dry, no rashes. Cardiac: Regular rate and rhythm, no murmurs rubs or gallops, no lower extremity edema.  Respiratory: Clear to auscultation bilaterally. Not using accessory muscles, speaking in full  sentences.   Impression and Recommendations:    Anxiety, generalized -discussed options.symptoms have ran\mped up.  I am glad she has appt in 4 weeks for therapy I think this will help her most in the long run.  Will increase zoloft to 150 mg and will refill her xanax to continue to use sparingly.  Can consider addition of Buspar.  F/U in 4-6 weeks.   Asthma, mild intermittant- ok to RF abluterol if needed.    Ear fullness with retraction of the left TM. Will tx with oral predisone x 5 days and recommend start a steroid nasal spray.  Ok to continue OTC antihistamine  Lightheadedness - will check labs for anemia, thyroid d/o, etc.  Just not feeling well in general.

## 2018-03-08 MED FILL — FLUTICASONE PROP 50 MCG SPR: 50 | 30 days supply | Qty: 16 | Fill #0

## 2018-03-08 MED FILL — clonazePAM 0.25 MG TBDP: 0.25 | 4 days supply | Qty: 10 | Fill #0

## 2018-03-16 ENCOUNTER — Ambulatory Visit: Payer: No Typology Code available for payment source | Attending: Family Medicine | Admitting: Audiology

## 2018-03-16 DIAGNOSIS — R94128 Abnormal results of other function studies of ear and other special senses: Secondary | ICD-10-CM | POA: Insufficient documentation

## 2018-03-16 DIAGNOSIS — R94120 Abnormal auditory function study: Secondary | ICD-10-CM | POA: Diagnosis present

## 2018-03-16 DIAGNOSIS — H9190 Unspecified hearing loss, unspecified ear: Secondary | ICD-10-CM

## 2018-03-16 DIAGNOSIS — Z01118 Encounter for examination of ears and hearing with other abnormal findings: Secondary | ICD-10-CM | POA: Insufficient documentation

## 2018-03-16 DIAGNOSIS — H918X9 Other specified hearing loss, unspecified ear: Secondary | ICD-10-CM | POA: Diagnosis present

## 2018-03-16 DIAGNOSIS — H93299 Other abnormal auditory perceptions, unspecified ear: Secondary | ICD-10-CM | POA: Insufficient documentation

## 2018-03-16 NOTE — Procedures (Signed)
Outpatient Audiology and [place]  [place], [place] [address]  [phone]   Audiological Evaluation  Patient Name: Sue [name]   Status: Outpatient   DOB: Jul 09, 1971    Diagnosis: Hearing loss with ear fullness bilateral MRN: [id number] Date:  03/16/2018     Referent: Hali Marry, MD  History: Irma Newness was seen for an audiological evaluation.  Primary Concern: "has trouble distinguishing words" especially in a social situation (party/restaurant). Often "asks family and three children to repeat words".  "History of ears getting stopped up with "ears popping" - decongestants helped". Pain: None History of hearing problems: Y - "has been going on about a year". History of ear infections:  Y a few as a child.  History of dizziness/vertigo:   N History of balance issues:  Y - a slight amount, occasionally when first getting up, sometime in the am feel like am leaning to left for a couple of minutes then goes away.  Tinnitus: N Sound sensitivity: Y - startles to sounds, but not getting worse- no change. History of occupational noise exposure: N History of hypertension: N History of diabetes:  N Family history of hearing loss: Y - parents in their 63's, no hearing loss in children.   Evaluation: Conventional pure tone audiometry from 250Hz  - 8000Hz  with using insert earphones.  Hearing Thresholds: Right ear:  Thresholds of 25-35 dBHL from 250Hz  - 6000Hz  and 15 dBHL at 8000Hz . The hearing loss is mixed.  Left ear:    Thresholds of 20-40 dbHL from250HZ - 4000Hz  and 40-45 dBHL from 6000Hz  - 8000Hz . Reliability is good Speech reception levels (repeating words near threshold) using recorded spondee word lists:  Right ear: 30 dBHL.  Left ear:  30 dBHL Word recognition (at loud conversational speech levels) using recorded NU-6 word lists at 70dBHL, in quiet.  Right ear: 100%.  Left ear:   100% Word recognition in minimal background  noise:  +5 dBHL  Right ear: 56%                              Left ear:  72%  Tympanometry showed normal middle ear volume and pressure bilaterally.  Right ear: Normal compliance (Type A).  Left ear: Slightly larger than expected compliance (Type Ad). Distortion Product Otoacoustic Emissions (DPOAE's), a test of inner ear function was completed from 2000Hz  - 10,000Hz  bilaterally:  Right ear: Present, borderline responses throughout the range supporting good outer hair cell function in the cochlea.  Left ear: Weak and abnormal responses throughout the range supporting abnormal outer hair cell function in the cochlea.  CONCLUSION:      FELICIDAD SUGARMAN has a mild to moderate hearing loss bilaterally that is slightly poorer on the left side. The hearing loss is primarily sensorineural on the left side and is mixed on the right side. Beautiful K Oyama has excellent word recognition at loud conversational speech levels in each ear in quiet. However, in minimal background noise her word recognition drops to poor on the right side and fair on the left. Missing 30-40% of what is said in most social setting is expected. Some recruitment of difficulty tolerating loudness was also noticed.  This amount of hearing loss would adversely affect speech communication at normal conversational speech levels.      Since there is significant hearing loss for a 47 year old, further evaluation by an ENT recommended. A hearing aid evaluation is  also recommended. The test results were discussed and Irma Newness counseled.   RECOMMENDATIONS: 1.   Referral to an ENT for evaluation of hearing loss.  2.   A hearing aid evaluation. As discussed [place] has hearing aid benefits-contact info to find out further information was provided.  3.   Monitor hearing closely with a repeat audiological evaluation in 3-6 months to rule out a progressive hearing loss. This evaluation may be completed here, with an ENT and/or in  conjunction with a hearing aid evaluation. Please schedule an earlier audiological evaluation  if there is any change in hearing or ear pressure.  4.  Strategies that help improve hearing include: A) Face the speaker directly. Optimal is having the speakers face well - lit.  Unless amplified, being within 3-6 feet of the speaker will enhance word recognition. B) Avoid having the speaker back-lit as this will minimize the ability to use cues from lip-reading, facial expression and gestures. C)  Word recognition is poorer in background noise. For optimal word recognition, turn off the TV, radio or noisy fan when engaging in conversation. In a restaurant, try to sit away from noise sources and close to the primary speaker.  D)  Ask for topic clarification from time to time in order to remain in the conversation.  Most people don't mind repeating or clarifying a point when asked.  If needed, explain the difficulty hearing in background noise or hearing loss.  5.   Use hearing protection during noisy activities such as using a weed eater, moving the lawn, shooting, etc.    Musician's plugs, are available from [employer] for music related hearing protection because there is no distortion.  Other hearing protection, such as sponge plugs (available at pharmacies) or earmuffs (available at sporting goods stores or department stores such as Paediatric nurse) are useful for noisy activities and venues.  6.  If feeling of leaning to the left side in the morning persists or worsens, consider referral to the [facility] to rule out/ treat BPPV.   Deborah L. Heide Spark, Au.D., CCC-A Doctor of Audiology  03/16/2018   cc: Hali Marry, MD

## 2018-03-29 ENCOUNTER — Ambulatory Visit
Admission: RE | Admit: 2018-03-29 | Discharge: 2018-03-29 | Disposition: A | Discharge status: Home / Self Care | Payer: No Typology Code available for payment source | Source: Ambulatory Visit | Attending: Family Medicine | Admitting: Family Medicine

## 2018-03-29 DIAGNOSIS — Z1231 Encounter for screening mammogram for malignant neoplasm of breast: Secondary | ICD-10-CM

## 2018-03-30 ENCOUNTER — Other Ambulatory Visit: Payer: Self-pay | Admitting: Family Medicine

## 2018-03-30 DIAGNOSIS — R928 Other abnormal and inconclusive findings on diagnostic imaging of breast: Secondary | ICD-10-CM

## 2018-04-01 ENCOUNTER — Ambulatory Visit
Admission: RE | Admit: 2018-04-01 | Discharge: 2018-04-01 | Disposition: A | Discharge status: Home / Self Care | Payer: No Typology Code available for payment source | Source: Ambulatory Visit | Attending: Family Medicine | Admitting: Family Medicine

## 2018-04-01 ENCOUNTER — Ambulatory Visit: Payer: No Typology Code available for payment source

## 2018-04-01 DIAGNOSIS — R928 Other abnormal and inconclusive findings on diagnostic imaging of breast: Secondary | ICD-10-CM

## 2018-05-05 ENCOUNTER — Ambulatory Visit (INDEPENDENT_AMBULATORY_CARE_PROVIDER_SITE_OTHER): Payer: No Typology Code available for payment source | Admitting: Family Medicine

## 2018-05-05 ENCOUNTER — Encounter: Payer: Self-pay | Admitting: Family Medicine

## 2018-05-05 VITALS — BP 130/70 | HR 77 | Ht 66.0 in | Wt 177.0 lb

## 2018-05-05 DIAGNOSIS — F411 Generalized anxiety disorder: Secondary | ICD-10-CM

## 2018-05-05 DIAGNOSIS — L989 Disorder of the skin and subcutaneous tissue, unspecified: Secondary | ICD-10-CM

## 2018-05-05 DIAGNOSIS — H9193 Unspecified hearing loss, bilateral: Secondary | ICD-10-CM

## 2018-05-05 NOTE — Progress Notes (Signed)
Subjective:    Patient ID: Sue [name], female    DOB: 15-May-1971, 47 y.o.   MRN: [id number]  HPI F/U GAD -37-monthfollow-up.  Currently on Zoloft.  She was trying to improve her consistency with taking her medications as sometimes she would forget.  She was struggling with some concentration issues.  Been seeing a therapist.  We opted to actually increase her Zoloft 150 mg 8 weeks ago.  He is clonazepam as needed.  We had even discussed adding buspirone if needed.  Psychiatrist is recommended a consultation with psychiatry for medication management.  She did see ENT and they did recommend a ENT referral and she will need a new referral for that.  Dermatology-she currently sees Dr. KJari Piggand will need a referral placed for that.  Has a skin lesion on her nose that she was using a topical cream on that was and was supposed to be like a topical chemotherapy but it did not cause any irritation or flaking and so she wants to go back and have the area rechecked.  She is been seeing Dr. GGirtha Rmfor about 10 years.   Review of Systems  BP 130/70   Pulse 77   Ht 5' 6"  (1.676 m)   Wt 177 lb (80.3 kg)   BMI 28.57 kg/m     No Known Allergies  Past Medical History:  Diagnosis Date  . Anxiety   . Asthma   . Basal cell carcinoma of skin of face 2007   Dr. GDelman Cheadle   Past Surgical History:  Procedure Laterality Date  . MOHS SURGERY     removal of basal cell carcinoma    Social History   Socioeconomic History  . Marital status: Married    Spouse name: Not on file  . Number of children: Not on file  . Years of education: Not on file  . Highest education level: Not on file  Occupational History  . Occupation: [employer]     Comment: SAdvertising copywriter  Social Needs  . Financial resource strain: Not on file  . Food insecurity:    Worry: Not on file    Inability: Not on file  . Transportation needs:    Medical: Not on file    Non-medical: Not on file  Tobacco Use  .  Smoking status: Never Smoker  . Smokeless tobacco: Never Used  Substance and Sexual Activity  . Alcohol use: Yes    Comment: couple drinks per week  . Drug use: No  . Sexual activity: Not on file  Lifestyle  . Physical activity:    Days per week: Not on file    Minutes per session: Not on file  . Stress: Not on file  Relationships  . Social connections:    Talks on phone: Not on file    Gets together: Not on file    Attends religious service: Not on file    Active member of club or organization: Not on file    Attends meetings of clubs or organizations: Not on file    Relationship status: Not on file  . Intimate partner violence:    Fear of current or ex partner: Not on file    Emotionally abused: Not on file    Physically abused: Not on file    Forced sexual activity: Not on file  Other Topics Concern  . Not on file  Social History Narrative  . Not on file    Family History  Problem Relation Age of Onset  . Hypertension Mother   . Hyperlipidemia Father   . Hypertension Father   . Diabetes Maternal Grandfather   . Depression Sister     Outpatient Encounter Medications as of 05/05/2018  Medication Sig  . clonazePAM (KLONOPIN) 0.25 MG disintegrating tablet Take one tab by mouth one to three times daily as needed for anxiety  . fluticasone (FLONASE) 50 MCG/ACT nasal spray Place 1-2 sprays into both nostrils daily.  . hydrOXYzine (ATARAX/VISTARIL) 25 MG tablet Take 1 tablet (25 mg total) by mouth 3 (three) times daily as needed for anxiety.  Marland Kitchen levonorgestrel (MIRENA, 52 MG,) 20 MCG/24HR IUD 20 mcg by Intrauterine route once.  . propranolol (INDERAL) 10 MG tablet TAKE 1 TABLET BY MOUTH TWICE DAILY AS NEEDED FOR PUBLIC SPEAKING  . sertraline (ZOLOFT) 100 MG tablet Take 1.5 tablets (150 mg total) by mouth daily.  . VENTOLIN HFA 108 (90 Base) MCG/ACT inhaler INHALE 2 PUFFS BY MOUTH INTO THE LUNGS EVERY 4 HOURS AS NEEDED FOR WHEEZING.  . [DISCONTINUED] predniSONE (DELTASONE) 20  MG tablet Take 2 tablets (40 mg total) by mouth daily with breakfast.   No facility-administered encounter medications on file as of 05/05/2018.          Objective:   Physical Exam  Constitutional: She is oriented to person, place, and time. She appears well-developed and well-nourished.  HENT:  Head: Normocephalic and atraumatic.  Cardiovascular: Normal rate, regular rhythm and normal heart sounds.  Pulmonary/Chest: Effort normal and breath sounds normal.  Neurological: She is alert and oriented to person, place, and time.  Skin: Skin is warm and dry.  Psychiatric: She has a normal mood and affect. Her behavior is normal.        Assessment & Plan:  GAD - improved.   Continue sertraline 150 mg.  She is feeling a little better she wants to hold off on any changes today.  Also go ahead and place referral to psychiatry for medication management.  She had a couple names that were given to her by her therapist, Mr. Jackelyn Hoehn who she is enjoying working with and has been very beneficial for her.Marland Kitchen  PHQ 9 score 6 and gad 7 score of 10.  Skin lesion on her nose-we will refer her back to dermatology.  Referral placed.  Bilateral hearing loss-she still getting a lot of popping in her ears and pressure change and would like to be seen by ENT before considering hearing aids.

## 2018-05-22 ENCOUNTER — Ambulatory Visit (INDEPENDENT_AMBULATORY_CARE_PROVIDER_SITE_OTHER): Payer: Self-pay | Admitting: Family Medicine

## 2018-05-22 ENCOUNTER — Encounter: Payer: Self-pay | Admitting: Family Medicine

## 2018-05-22 VITALS — BP 118/76 | HR 73 | Temp 98.6°F | Resp 16 | Wt 178.2 lb

## 2018-05-22 DIAGNOSIS — Z0000 Encounter for general adult medical examination without abnormal findings: Secondary | ICD-10-CM

## 2018-05-22 NOTE — Patient Instructions (Signed)

## 2018-05-22 NOTE — Progress Notes (Signed)
Sue [name] is a 47 y.o. female who presents today with concerns of an employment physical exam. Patient has a PCP and is under routine care.   Review of Systems  Constitutional: Negative for chills, fever and malaise/fatigue.  HENT: Negative for congestion, ear discharge, ear pain, sinus pain and sore throat.   Eyes: Negative.   Respiratory: Negative for cough, sputum production and shortness of breath.   Cardiovascular: Negative.  Negative for chest pain.  Gastrointestinal: Negative for abdominal pain, diarrhea, nausea and vomiting.  Genitourinary: Negative for dysuria, frequency, hematuria and urgency.  Musculoskeletal: Negative for myalgias.  Skin: Negative.   Neurological: Negative for headaches.  Endo/Heme/Allergies: Negative.   Psychiatric/Behavioral: Negative.     O: Vitals:   05/22/18 1057  BP: 118/76  Pulse: 73  Resp: 16  Temp: 98.6 F (37 C)  SpO2: 98%     Physical Exam  Constitutional: She is oriented to person, place, and time. Vital signs are normal. She appears well-developed and well-nourished. She is active.  Non-toxic appearance. She does not have a sickly appearance.  HENT:  Head: Normocephalic.  Right Ear: Hearing, tympanic membrane, external ear and ear canal normal.  Left Ear: Hearing, tympanic membrane, external ear and ear canal normal.  Nose: Nose normal.  Mouth/Throat: Uvula is midline and oropharynx is clear and moist.  Neck: Normal range of motion. Neck supple.  Cardiovascular: Normal rate, regular rhythm, normal heart sounds and normal pulses.  Pulmonary/Chest: Effort normal and breath sounds normal.  Abdominal: Soft. Bowel sounds are normal.  Musculoskeletal: Normal range of motion.  Lymphadenopathy:       Head (right side): No submental and no submandibular adenopathy present.       Head (left side): No submental and no submandibular adenopathy present.    She has no cervical adenopathy.  Neurological: She is alert and oriented to  person, place, and time.  Psychiatric: She has a normal mood and affect. Her speech is normal and behavior is normal. Cognition and memory are normal.  PHQ-9- negative  Vitals reviewed.  A: 1. Physical exam    P: Exam findings, diagnosis etiology and medication use and indications reviewed with patient. Follow- Up and discharge instructions provided. No emergent/urgent issues found on exam.  Patient verbalized understanding of information provided and agrees with plan of care (POC), all questions answered.  1. Physical exam

## 2018-06-08 ENCOUNTER — Ambulatory Visit: Payer: No Typology Code available for payment source | Admitting: Family Medicine

## 2018-06-25 MED FILL — SERTRALINE HCL 100 MG TAB: 100 | 90 days supply | Qty: 90 | Fill #1

## 2018-08-16 DIAGNOSIS — H9193 Unspecified hearing loss, bilateral: Secondary | ICD-10-CM | POA: Insufficient documentation

## 2018-08-16 DIAGNOSIS — C4491 Basal cell carcinoma of skin, unspecified: Secondary | ICD-10-CM | POA: Insufficient documentation

## 2018-08-16 MED FILL — ESTRADIOL 2 MG TABLET: 2 | 21 days supply | Qty: 21 | Fill #0

## 2018-08-16 MED FILL — clonazePAM 0.25 MG TBDP: 0.25 | 4 days supply | Qty: 10 | Fill #1

## 2018-08-17 LAB — HM PAP SMEAR: HM Pap smear: NEGATIVE

## 2018-08-26 ENCOUNTER — Encounter: Payer: Self-pay | Admitting: Family Medicine

## 2018-10-13 ENCOUNTER — Encounter: Payer: Self-pay | Admitting: Family Medicine

## 2018-10-13 ENCOUNTER — Ambulatory Visit (INDEPENDENT_AMBULATORY_CARE_PROVIDER_SITE_OTHER): Payer: No Typology Code available for payment source | Admitting: Family Medicine

## 2018-10-13 VITALS — BP 128/63 | HR 84 | Ht 64.67 in | Wt 174.0 lb

## 2018-10-13 DIAGNOSIS — K5900 Constipation, unspecified: Secondary | ICD-10-CM

## 2018-10-13 DIAGNOSIS — R110 Nausea: Secondary | ICD-10-CM

## 2018-10-13 DIAGNOSIS — R1030 Lower abdominal pain, unspecified: Secondary | ICD-10-CM | POA: Diagnosis not present

## 2018-10-13 DIAGNOSIS — R140 Abdominal distension (gaseous): Secondary | ICD-10-CM | POA: Diagnosis not present

## 2018-10-13 NOTE — Patient Instructions (Signed)
REcommend start Colace ( stool softener) with each meal if can.   Start probiotic for the bloating ( Cuterelle, Align, etc) When back from your trip start Miralax 1 capful mixed with 6 ounces of fluid twice a day until soft runny bowel movement. If you are not improving please let me know.

## 2018-10-13 NOTE — Progress Notes (Signed)
Acute Office Visit  Subjective:    Patient ID: Sue [name], female    DOB: 1971/06/18, 47 y.o.   MRN: [id number]  Chief Complaint  Patient presents with  . Abdominal Pain    pt reprts that over the past few wks she has noticed changes in her BM's she has been more constipated, lower abdominal cramping, nausea. she has not tried any OTC meds for this. she recently was seen by her GYN and had her pap and a US done this was NL other than a small cyst that was not concerning. she denies any acid reflux and has tried to consume more water,she has lowered her coffee consumption    HPI Patient is in today for Abdominal Pain - pt reprts that over the past few wks she has noticed changes in her BM's she has been more constipated, lower abdominal cramping, nausea. she has not tried any OTC meds for this. she recently was seen by her GYN and had her pap and a US done this was NL other than a small cyst that was not concerning. she denies any acid reflux and has tried to consume more water,she has lowered her coffee consumption.  Pain is mostly bilateral over the lower pelvic area.  Just a pressure.  It does not necessarily keep her from doing things just on most of discomfort.  She has had reflux before but denies any heartburn or reflux symptoms.  But more recently over the last week or so she has been feeling a little nauseated.  She getting ready to leave town tonight for a vacation for a week.  She says even though she is lost about 4 pounds she actually feels like her pants are fitting more tightly.   Past Medical History:  Diagnosis Date  . Anxiety   . Asthma   . Basal cell carcinoma of skin of face 2007   Dr. Delman Cheadle    Past Surgical History:  Procedure Laterality Date  . MOHS SURGERY     removal of basal cell carcinoma    Family History  Problem Relation Age of Onset  . Hypertension Mother   . Hyperlipidemia Father   . Hypertension Father   . Diabetes Maternal Grandfather   .  Depression Sister     Social History   Socioeconomic History  . Marital status: Married    Spouse name: Not on file  . Number of children: Not on file  . Years of education: Not on file  . Highest education level: Not on file  Occupational History  . Occupation: [employer].     Comment: Advertising copywriter   Social Needs  . Financial resource strain: Not on file  . Food insecurity:    Worry: Not on file    Inability: Not on file  . Transportation needs:    Medical: Not on file    Non-medical: Not on file  Tobacco Use  . Smoking status: Never Smoker  . Smokeless tobacco: Never Used  Substance and Sexual Activity  . Alcohol use: Yes    Comment: couple drinks per week  . Drug use: No  . Sexual activity: Not on file  Lifestyle  . Physical activity:    Days per week: Not on file    Minutes per session: Not on file  . Stress: Not on file  Relationships  . Social connections:    Talks on phone: Not on file    Gets together: Not on file  Attends religious service: Not on file    Active member of club or organization: Not on file    Attends meetings of clubs or organizations: Not on file    Relationship status: Not on file  . Intimate partner violence:    Fear of current or ex partner: Not on file    Emotionally abused: Not on file    Physically abused: Not on file    Forced sexual activity: Not on file  Other Topics Concern  . Not on file  Social History Narrative  . Not on file    Outpatient Medications Prior to Visit  Medication Sig Dispense Refill  . clonazePAM (KLONOPIN) 0.25 MG disintegrating tablet Take one tab by mouth one to three times daily as needed for anxiety 10 tablet 3  . fluticasone (FLONASE) 50 MCG/ACT nasal spray Place 1-2 sprays into both nostrils daily. 16 g 6  . levonorgestrel (MIRENA, 52 MG,) 20 MCG/24HR IUD 20 mcg by Intrauterine route once.    . propranolol (INDERAL) 10 MG tablet TAKE 1 TABLET BY MOUTH TWICE DAILY AS NEEDED FOR PUBLIC  SPEAKING 30 tablet 0  . sertraline (ZOLOFT) 100 MG tablet Take 1.5 tablets (150 mg total) by mouth daily. 135 tablet 1  . VENTOLIN HFA 108 (90 Base) MCG/ACT inhaler INHALE 2 PUFFS BY MOUTH INTO THE LUNGS EVERY 4 HOURS AS NEEDED FOR WHEEZING. 54 g 1   No facility-administered medications prior to visit.     No Known Allergies  ROS     Objective:    Physical Exam  BP 128/63   Pulse 84   Ht 5' 4.67" (1.643 m)   Wt 174 lb (78.9 kg)   SpO2 99%   BMI 29.26 kg/m  Wt Readings from Last 3 Encounters:  10/13/18 174 lb (78.9 kg)  05/22/18 178 lb 3.2 oz (80.8 kg)  05/05/18 177 lb (80.3 kg)    There are no preventive care reminders to display for this patient.  There are no preventive care reminders to display for this patient.   Lab Results  Component Value Date   TSH 2.41 08/18/2016   Lab Results  Component Value Date   WBC 8.4 06/09/2017   HGB 13.4 06/09/2017   HCT 38.2 06/09/2017   MCV 88.6 06/09/2017   PLT 167 06/09/2017   Lab Results  Component Value Date   NA 139 06/09/2017   K 3.5 06/09/2017   CO2 24 06/09/2017   GLUCOSE 103 (H) 06/09/2017   BUN 13 06/09/2017   CREATININE 0.71 06/09/2017   BILITOT 0.4 06/09/2017   ALKPHOS 57 06/09/2017   AST 17 06/09/2017   ALT 20 06/09/2017   PROT 6.8 06/09/2017   ALBUMIN 3.8 06/09/2017   CALCIUM 9.0 06/09/2017   ANIONGAP 9 06/09/2017   Lab Results  Component Value Date   CHOL 116 (L) 08/18/2016   Lab Results  Component Value Date   HDL 47 08/18/2016   Lab Results  Component Value Date   LDLCALC 59 08/18/2016   Lab Results  Component Value Date   TRIG 51 08/18/2016   Lab Results  Component Value Date   CHOLHDL 2.5 08/18/2016   Lab Results  Component Value Date   HGBA1C 5.3 02/12/2017       Assessment & Plan:   Problem List Items Addressed This Visit    None    Visit Diagnoses    Lower abdominal pain    -  Primary   Constipation, unspecified constipation type  Bloating       Nausea          Lower abdominal pain-likely secondary to constipation and bloating.  Treat as below with bowel regimen.  If she is not improving then will work-up further.  At least she has had the GYN work-up which was essentially negative.  Constipation-we discussed getting on a regimen to help clean out her bowels that she is actually getting ready to leave town and I just recommend that she does a stool softener one with each meal of the day for now until she is back in town.  Once she is back she can use MiraLAX twice a day until she has loose soft bowel movements.  I can explained that sometimes it can take a couple days where it may take as long as a week or 2.   Bloating-in addition recommend that she go ahead and try a probiotic for the bloating.  Nausea- This could be from multiple issues that could certainly be from constipation but it could also be related to gastritis or ulcer.  She is not having any heartburn though which is reassuring.  We will try to work on getting her bowels moving and see if that helps.  In the meantime just eat small meals.  No orders of the defined types were placed in this encounter.    Beatrice Lecher, MD

## 2018-10-31 ENCOUNTER — Encounter: Payer: Self-pay | Admitting: Family Medicine

## 2018-10-31 DIAGNOSIS — L989 Disorder of the skin and subcutaneous tissue, unspecified: Secondary | ICD-10-CM

## 2018-11-25 MED FILL — SERTRALINE HCL 100 MG TAB: 100 | 90 days supply | Qty: 135 | Fill #0

## 2019-01-23 ENCOUNTER — Ambulatory Visit (INDEPENDENT_AMBULATORY_CARE_PROVIDER_SITE_OTHER): Payer: Self-pay | Admitting: Nurse Practitioner

## 2019-01-23 VITALS — BP 100/75 | HR 72 | Temp 98.2°F | Resp 16 | Wt 176.0 lb

## 2019-01-23 DIAGNOSIS — J019 Acute sinusitis, unspecified: Secondary | ICD-10-CM

## 2019-01-23 DIAGNOSIS — J45909 Unspecified asthma, uncomplicated: Secondary | ICD-10-CM

## 2019-01-23 DIAGNOSIS — B9689 Other specified bacterial agents as the cause of diseases classified elsewhere: Secondary | ICD-10-CM

## 2019-01-23 MED ORDER — FLUTICASONE PROPIONATE 50 MCG/ACT NA SUSP: 2.0000 | Freq: Every day | NASAL | 0 refills | Status: DC

## 2019-01-23 MED ORDER — AMOXICILLIN-POT CLAVULANATE 875-125 MG PO TABS: 1.0000 | ORAL_TABLET | Freq: Two times a day (BID) | ORAL | 0 refills | Status: AC

## 2019-01-23 MED ORDER — MONTELUKAST SODIUM 10 MG PO TABS: 10.0000 mg | ORAL_TABLET | Freq: Every day | ORAL | 0 refills | Status: DC

## 2019-01-23 MED ORDER — ALBUTEROL SULFATE HFA 108 (90 BASE) MCG/ACT IN AERS: 2.0000 | INHALATION_SPRAY | Freq: Four times a day (QID) | RESPIRATORY_TRACT | 0 refills | Status: DC | PRN

## 2019-01-23 MED FILL — FLUTICASONE PROP 50 MCG SPR: 50 | 30 days supply | Qty: 16 | Fill #0

## 2019-01-23 MED FILL — VENTOLIN HFA 90 MCG INHALER: 108 (90 BAS | 25 days supply | Qty: 18 | Fill #0

## 2019-01-23 MED FILL — MONTELUKAST SOD 10 MG TAB: 10 | 30 days supply | Qty: 30 | Fill #0

## 2019-01-23 MED FILL — AMOX-CLAV 875-125 MG TABLET: 875-125 | 7 days supply | Qty: 14 | Fill #0

## 2019-01-23 NOTE — Progress Notes (Signed)
MRN: [id number] DOB: 1971-11-03  Subjective:   Sue [name] is a 48 y.o. female presenting for chief complaint of Sinus Problem (x 4 weeks) and Ear Pain (x 4 weeks, left ear  ) .  Reports 4 week history of sinus congestion , rhinorrhea, ear fullness and wheezing, fatigue. Has tried Pseudoephedrine with some relief. Denies fever, itchy watery eyes, red eyes, difficulty swallowing, pain with swallowing, inability to swallow, voice change, productive cough and shortness of breath, chills, nausea, vomiting, abdominal pain, diarrhea and fever.  Patient states her symptoms started out to be a simple cold.  Patient felt like she was getting better as she was taking pseudoephedrine.  Patient states over the last 4 days, she feels like she has had worsening nasal congestion with worsening left ear pain and pressure.  Has not had sick contacts with influenza or strep. Does have a history of seasonal allergies and history of asthma.  Patient informs her asthma is well controlled; however, over the last 3 to 4 days she has had to use her rescue inhaler from time to time.  The patient says she was not "short of breath, but just needed something to open her up a little".  Patient had had a flu shot this season. Denies smoking. Denies any other aggravating or relieving factors, no other questions or concerns.  Review of Systems  Constitutional: Positive for malaise/fatigue. Negative for fever.  HENT: Positive for congestion, sinus pain and sore throat. Negative for ear discharge and ear pain.        Left ear pain/pressure, "feels like popping", + postnasal drip  Eyes: Negative.   Respiratory: Positive for shortness of breath (mild).   Cardiovascular: Negative.   Neurological: Positive for headaches.  Endo/Heme/Allergies: Positive for environmental allergies.    Sue [name] has a current medication list which includes the following prescription(s): levonorgestrel, pseudoephedrine, sertraline, clonazepam,  fluticasone, propranolol, and ventolin hfa. Also has No Known Allergies.  Sue [name]  has a past medical history of Anxiety, Asthma, and Basal cell carcinoma of skin of face (2007). Also  has a past surgical history that includes Mohs surgery.   Objective:   Vitals: BP 100/75 (BP Location: Right Arm, Patient Position: Sitting, Cuff Size: Normal)   Pulse 72   Temp 98.2 F (36.8 C) (Oral)   Resp 16   Wt 176 lb (79.8 kg)   SpO2 98%   BMI 29.59 kg/m   Physical Exam Vitals signs reviewed.  Constitutional:      General: She is not in acute distress. HENT:     Head: Normocephalic.     Right Ear: Tympanic membrane, ear canal and external ear normal.     Left Ear: Hearing, ear canal and external ear normal. A middle ear effusion is present.     Nose: Mucosal edema present.     Right Turbinates: Enlarged and swollen.     Left Turbinates: Enlarged and swollen.     Right Sinus: No maxillary sinus tenderness or frontal sinus tenderness.     Left Sinus: No maxillary sinus tenderness or frontal sinus tenderness.     Mouth/Throat:     Lips: Pink.     Pharynx: Uvula midline. Posterior oropharyngeal erythema present. No pharyngeal swelling, oropharyngeal exudate or uvula swelling.     Tonsils: No tonsillar exudate. Swelling: 0 on the right. 0 on the left.  Eyes:     Pupils: Pupils are equal, round, and reactive to light.  Neck:     Musculoskeletal: Normal range of  motion and neck supple.  Cardiovascular:     Rate and Rhythm: Normal rate and regular rhythm.     Pulses: Normal pulses.     Heart sounds: Normal heart sounds.  Pulmonary:     Effort: Pulmonary effort is normal. No respiratory distress.     Breath sounds: Normal breath sounds. No stridor. No wheezing, rhonchi or rales.  Abdominal:     General: Bowel sounds are normal.     Palpations: Abdomen is soft.     Tenderness: There is no abdominal tenderness.  Lymphadenopathy:     Cervical: No cervical adenopathy.  Skin:    General:  Skin is warm and dry.     Capillary Refill: Capillary refill takes less than 2 seconds.  Neurological:     General: No focal deficit present.     Mental Status: She is alert and oriented to person, place, and time.     Cranial Nerves: No cranial nerve deficit.  Psychiatric:        Mood and Affect: Mood normal.        Thought Content: Thought content normal.     Assessment and Plan :  Exam findings, diagnosis etiology and medication use and indications reviewed with patient. Follow- Up and discharge instructions provided. No emergent/urgent issues found on exam.  Based on the patient's clinical presentation, symptoms, physical exam, and duration of symptoms, patient's findings are congruent with that of a bacterial sinusitis.  Patient did have double sickening, and her symptoms have persisted for 4 weeks.  Patient also has slight asthma exacerbation as result of her prolonged symptoms.  I am going to start the patient on Augmentin, Flonase, Singulair, and provide a refill on her albuterol inhaler.  Patient was instructed that if needed she could use the Sudafed she has been taking to help with nasal congestion and the fluid in her left ear.  The patient is well-appearing, is in no acute distress, lungs CTAB.  Patient education was provided. Patient verbalized understanding of information provided and agrees with plan of care (POC), all questions answered. The patient is advised to call or return to clinic if condition does not see an improvement in symptoms, or to seek the care of the closest emergency department if condition worsens with the above plan.   1. Acute bacterial sinusitis  - amoxicillin-clavulanate (AUGMENTIN) 875-125 MG tablet; Take 1 tablet by mouth 2 (two) times daily for 7 days.  Dispense: 14 tablet; Refill: 0 - fluticasone (FLONASE) 50 MCG/ACT nasal spray; Place 2 sprays into both nostrils daily for 10 days.  Dispense: 16 g; Refill: 0 -Take medication as prescribed. -Ibuprofen or  Tylenol for pain, fever, or general discomfort. -Increase fluids. -Sleep elevated on at least 2 pillows at bedtime to help with cough if needed. -Use a humidifier or vaporizer when at home and during sleep to help with nasal congestion. -May use a teaspoon of honey or over-the-counter cough drops to help with cough if needed. -May use normal saline nasal spray to help with nasal congestion throughout the day. -Follow-up if symptoms do not improve.  2. Asthma, unspecified asthma severity, unspecified whether complicated, unspecified whether persistent  - montelukast (SINGULAIR) 10 MG tablet; Take 1 tablet (10 mg total) by mouth at bedtime for 30 days.  Dispense: 30 tablet; Refill: 0 - albuterol (PROVENTIL HFA;VENTOLIN HFA) 108 (90 Base) MCG/ACT inhaler; Inhale 2 puffs into the lungs every 6 (six) hours as needed for wheezing or shortness of breath.  Dispense: 1 Inhaler; Refill:  0 

## 2019-01-23 NOTE — Patient Instructions (Signed)
Sinusitis, Adult -Take medication as prescribed. -Ibuprofen or Tylenol for pain, fever, or general discomfort. -Increase fluids. -Sleep elevated on at least 2 pillows at bedtime to help with cough if needed. -Use a humidifier or vaporizer when at home and during sleep to help with nasal congestion. -May use a teaspoon of honey or over-the-counter cough drops to help with cough if needed. -May use normal saline nasal spray to help with nasal congestion throughout the day. -Follow-up if symptoms do not improve.   nusitis is inflammation of your sinuses. Sinuses are hollow spaces in the bones around your face. Your sinuses are located:  Around your eyes.  In the middle of your forehead.  Behind your nose.  In your cheekbones. Mucus normally drains out of your sinuses. When your nasal tissues become inflamed or swollen, mucus can become trapped or blocked. This allows bacteria, viruses, and fungi to grow, which leads to infection. Most infections of the sinuses are caused by a virus. Sinusitis can develop quickly. It can last for up to 4 weeks (acute) or for more than 12 weeks (chronic). Sinusitis often develops after a cold. What are the causes? This condition is caused by anything that creates swelling in the sinuses or stops mucus from draining. This includes:  Allergies.  Asthma.  Infection from bacteria or viruses.  Deformities or blockages in your nose or sinuses.  Abnormal growths in the nose (nasal polyps).  Pollutants, such as chemicals or irritants in the air.  Infection from fungi (rare). What increases the risk? You are more likely to develop this condition if you:  Have a weak body defense system (immune system).  Do a lot of swimming or diving.  Overuse nasal sprays.  Smoke. What are the signs or symptoms? The main symptoms of this condition are pain and a feeling of pressure around the affected sinuses. Other symptoms include:  Stuffy nose or  congestion.  Thick drainage from your nose.  Swelling and warmth over the affected sinuses.  Headache.  Upper toothache.  A cough that may get worse at night.  Extra mucus that collects in the throat or the back of the nose (postnasal drip).  Decreased sense of smell and taste.  Fatigue.  A fever.  Sore throat.  Bad breath. How is this diagnosed? This condition is diagnosed based on:  Your symptoms.  Your medical history.  A physical exam.  Tests to find out if your condition is acute or chronic. This may include: ? Checking your nose for nasal polyps. ? Viewing your sinuses using a device that has a light (endoscope). ? Testing for allergies or bacteria. ? Imaging tests, such as an MRI or CT scan. In rare cases, a bone biopsy may be done to rule out more serious types of fungal sinus disease. How is this treated? Treatment for sinusitis depends on the cause and whether your condition is chronic or acute.  If caused by a virus, your symptoms should go away on their own within 10 days. You may be given medicines to relieve symptoms. They include: ? Medicines that shrink swollen nasal passages (topical intranasal decongestants). ? Medicines that treat allergies (antihistamines). ? A spray that eases inflammation of the nostrils (topical intranasal corticosteroids). ? Rinses that help get rid of thick mucus in your nose (nasal saline washes).  If caused by bacteria, your health care provider may recommend waiting to see if your symptoms improve. Most bacterial infections will get better without antibiotic medicine. You may be given  antibiotics if you have: ? A severe infection. ? A weak immune system.  If caused by narrow nasal passages or nasal polyps, you may need to have surgery. Follow these instructions at home: Medicines  Take, use, or apply over-the-counter and prescription medicines only as told by your health care provider. These may include nasal  sprays.  If you were prescribed an antibiotic medicine, take it as told by your health care provider. Do not stop taking the antibiotic even if you start to feel better. Hydrate and humidify   Drink enough fluid to keep your urine pale yellow. Staying hydrated will help to thin your mucus.  Use a cool mist humidifier to keep the humidity level in your home above 50%.  Inhale steam for 10-15 minutes, 3-4 times a day, or as told by your health care provider. You can do this in the bathroom while a hot shower is running.  Limit your exposure to cool or dry air. Rest  Rest as much as possible.  Sleep with your head raised (elevated).  Make sure you get enough sleep each night. General instructions   Apply a warm, moist washcloth to your face 3-4 times a day or as told by your health care provider. This will help with discomfort.  Wash your hands often with soap and water to reduce your exposure to germs. If soap and water are not available, use hand sanitizer.  Do not smoke. Avoid being around people who are smoking (secondhand smoke).  Keep all follow-up visits as told by your health care provider. This is important. Contact a health care provider if:  You have a fever.  Your symptoms get worse.  Your symptoms do not improve within 10 days. Get help right away if:  You have a severe headache.  You have persistent vomiting.  You have severe pain or swelling around your face or eyes.  You have vision problems.  You develop confusion.  Your neck is stiff.  You have trouble breathing. Summary  Sinusitis is soreness and inflammation of your sinuses. Sinuses are hollow spaces in the bones around your face.  This condition is caused by nasal tissues that become inflamed or swollen. The swelling traps or blocks the flow of mucus. This allows bacteria, viruses, and fungi to grow, which leads to infection.  If you were prescribed an antibiotic medicine, take it as told by  your health care provider. Do not stop taking the antibiotic even if you start to feel better.  Keep all follow-up visits as told by your health care provider. This is important. This information is not intended to replace advice given to you by your health care provider. Make sure you discuss any questions you have with your health care provider. Document Released: 11/09/2005 Document Revised: 04/11/2018 Document Reviewed: 04/11/2018 Elsevier Interactive Patient Education  2019 [place].  Asthma, Adult  Asthma is a long-term (chronic) condition that causes recurrent episodes in which the airways become tight and narrow. The airways are the passages that lead from the nose and mouth down into the lungs. Asthma episodes, also called asthma attacks, can cause coughing, wheezing, shortness of breath, and chest pain. The airways can also fill with mucus. During an attack, it can be difficult to breathe. Asthma attacks can range from minor to life threatening. Asthma cannot be cured, but medicines and lifestyle changes can help control it and treat acute attacks. What are the causes? This condition is believed to be caused by inherited (genetic) and  environmental factors, but its exact cause is not known. There are many things that can bring on an asthma attack or make asthma symptoms worse (triggers). Asthma triggers are different for each person. Common triggers include:  Mold.  Dust.  Cigarette smoke.  Cockroaches.  Things that can cause allergy symptoms (allergens), such as animal dander or pollen from trees or grass.  Air pollutants such as household cleaners, wood smoke, smog, or [employer] account planner.  Cold air, weather changes, and winds (which increase molds and pollen in the air).  Strong emotional expressions such as crying or laughing hard.  Stress.  Certain medicines (such as aspirin) or types of medicines (such as beta-blockers).  Sulfites in foods and drinks. Foods and  drinks that may contain sulfites include dried fruit, potato chips, and sparkling grape juice.  Infections or inflammatory conditions such as the flu, a cold, or inflammation of the nasal membranes (rhinitis).  Gastroesophageal reflux disease (GERD).  Exercise or strenuous activity. What are the signs or symptoms? Symptoms of this condition may occur right after asthma is triggered or many hours later. Symptoms include:  Wheezing. This can sound like whistling when you breathe.  Excessive nighttime or early morning coughing.  Frequent or severe coughing with a common cold.  Chest tightness.  Shortness of breath.  Tiredness (fatigue) with minimal activity. How is this diagnosed? This condition is diagnosed based on:  Your medical history.  A physical exam.  Tests, which may include: ? Lung function studies and pulmonary studies (spirometry). These tests can evaluate the flow of air in your lungs. ? Allergy tests. ? Imaging tests, such as X-rays. How is this treated? There is no cure for this condition, but treatment can help control your symptoms. Treatment for asthma usually involves:  Identifying and avoiding your asthma triggers.  Using medicines to control your symptoms. Generally, two types of medicines are used to treat asthma: ? Controller medicines. These help prevent asthma symptoms from occurring. They are usually taken every day. ? Fast-acting reliever or rescue medicines. These quickly relieve asthma symptoms by widening the narrow and tight airways. They are used as needed and provide short-term relief.  Using supplemental oxygen. This may be needed during a severe episode.  Using other medicines, such as: ? Allergy medicines, such as antihistamines, if your asthma attacks are triggered by allergens. ? Immune medicines (immunomodulators). These are medicines that help control the immune system.  Creating an asthma action plan. An asthma action plan is a  written plan for managing and treating your asthma attacks. This plan includes: ? A list of your asthma triggers and how to avoid them. ? Information about when medicines should be taken and when their dosage should be changed. ? Instructions about using a device called a peak flow meter. A peak flow meter measures how well the lungs are working and the severity of your asthma. It helps you monitor your condition. Follow these instructions at home: Controlling your home environment Control your home environment in the following ways to help avoid triggers and prevent asthma attacks:  Change your heating and air conditioning filter regularly.  Limit your use of fireplaces and wood stoves.  Get rid of pests (such as roaches and mice) and their droppings.  Throw away plants if you see mold on them.  Clean floors and dust surfaces regularly. Use unscented cleaning products.  Try to have someone else vacuum for you regularly. Stay out of rooms while they are being vacuumed and for a  short while afterward. If you vacuum, use a dust mask from a hardware store, a double-layered or microfilter vacuum cleaner bag, or a vacuum cleaner with a HEPA filter.  Replace carpet with wood, tile, or vinyl flooring. Carpet can trap dander and dust.  Use allergy-proof pillows, mattress covers, and box spring covers.  Keep your bedroom a trigger-free room.  Avoid pets and keep windows closed when allergens are in the air.  Wash beddings every week in hot water and dry them in a dryer.  Use blankets that are made of polyester or cotton.  Clean bathrooms and kitchens with bleach. If possible, have someone repaint the walls in these rooms with mold-resistant paint. Stay out of the rooms that are being cleaned and painted.  Wash your hands often with soap and water. If soap and water are not available, use hand sanitizer.  Do not allow anyone to smoke in your home. General instructions  Take  over-the-counter and prescription medicines only as told by your health care provider. ? Speak with your health care provider if you have questions about how or when to take the medicines. ? Make note if you are requiring more frequent dosages.  Do not use any products that contain nicotine or tobacco, such as cigarettes and e-cigarettes. If you need help quitting, ask your health care provider. Also, avoid being exposed to secondhand smoke.  Use a peak flow meter as told by your health care provider. Record and keep track of the readings.  Understand and use the asthma action plan to help minimize, or stop an asthma attack, without needing to seek medical care.  Make sure you stay up to date on your yearly vaccinations as told by your health care provider. This may include vaccines for the flu and pneumonia.  Avoid outdoor activities when allergen counts are high and when air quality is low.  Wear a ski mask that covers your nose and mouth during outdoor winter activities. Exercise indoors on cold days if you can.  Warm up before exercising, and take time for a cool-down period after exercise.  Keep all follow-up visits as told by your health care provider. This is important. Where to find more information  For information about asthma, turn to the Centers for Disease Control and Prevention at http://www.clark.net/.htm  For air quality information, turn to [employer] at WeightRating.nl Contact a health care provider if:  You have wheezing, shortness of breath, or a cough even while you are taking medicine to prevent attacks.  The mucus you cough up (sputum) is thicker than usual.  Your sputum changes from clear or white to yellow, green, gray, or bloody.  Your medicines are causing side effects, such as a rash, itching, swelling, or trouble breathing.  You need to use a reliever medicine more than 2-3 times a week.  Your peak flow reading is still at 50-79% of your personal  best after following your action plan for 1 hour.  You have a fever. Get help right away if:  You are getting worse and do not respond to treatment during an asthma attack.  You are short of breath when at rest or when doing very little physical activity.  You have difficulty eating, drinking, or talking.  You have chest pain or tightness.  You develop a fast heartbeat or palpitations.  You have a bluish color to your lips or fingernails.  You are light-headed or dizzy, or you faint.  Your peak flow reading is less than 50%  of your personal best.  You feel too tired to breathe normally. Summary  Asthma is a long-term (chronic) condition that causes recurrent episodes in which the airways become tight and narrow. These episodes can cause coughing, wheezing, shortness of breath, and chest pain.  Asthma cannot be cured, but medicines and lifestyle changes can help control it and treat acute attacks.  Make sure you understand how to avoid triggers and how and when to use your medicines.  Asthma attacks can range from minor to life threatening. Get help right away if you have an asthma attack and do not respond to treatment with your usual rescue medicines. This information is not intended to replace advice given to you by your health care provider. Make sure you discuss any questions you have with your health care provider. Document Released: 11/09/2005 Document Revised: 12/14/2016 Document Reviewed: 12/14/2016 Elsevier Interactive Patient Education  2019 [employer].

## 2019-01-25 ENCOUNTER — Telehealth: Payer: Self-pay

## 2019-01-25 NOTE — Telephone Encounter (Signed)
LM on pt vm tcb regarding how she is feeling since her visit with [place].

## 2019-02-03 ENCOUNTER — Telehealth: Payer: No Typology Code available for payment source | Admitting: Nurse Practitioner

## 2019-02-03 DIAGNOSIS — J00 Acute nasopharyngitis [common cold]: Secondary | ICD-10-CM

## 2019-02-03 MED ORDER — FLUTICASONE PROPIONATE 50 MCG/ACT NA SUSP: 2.0000 | Freq: Every day | NASAL | 6 refills | Status: DC

## 2019-02-03 NOTE — Addendum Note (Signed)
Addended by: Chevis Pretty on: 02/03/2019 06:22 PM   Modules accepted: Orders

## 2019-02-03 NOTE — Progress Notes (Signed)
We are sorry you are not feeling well.  Here is how we plan to help!  Based on what you have shared with me, it looks like you may have a viral upper respiratory infection.  Upper respiratory infections are caused by a large number of viruses; however, rhinovirus is the most common cause.   Symptoms vary from person to person, with common symptoms including sore throat, cough, and fatigue or lack of energy.  A low-grade fever of up to 100.4 may present, but is often uncommon.  Symptoms vary however, and are closely related to a person's age or underlying illnesses.  The most common symptoms associated with an upper respiratory infection are nasal discharge or congestion, cough, sneezing, headache and pressure in the ears and face.  These symptoms usually persist for about 3 to 10 days, but can last up to 2 weeks.  It is important to know that upper respiratory infections do not cause serious illness or complications in most cases.    Upper respiratory infections can be transmitted from person to person, with the most common method of transmission being a person's hands.  The virus is able to live on the skin and can infect other persons for up to 2 hours after direct contact.  Also, these can be transmitted when someone coughs or sneezes; thus, it is important to cover the mouth to reduce this risk.  To keep the spread of the illness at [place], good hand hygiene is very important.  This is an infection that is most likely caused by a virus. There are no specific treatments other than to help you with the symptoms until the infection runs its course.  We are sorry you are not feeling well.  Here is how we plan to help!   For nasal congestion, you may use an oral decongestants such as Mucinex D or if you have glaucoma or high blood pressure use plain Mucinex.  Saline nasal spray or nasal drops can help and can safely be used as often as needed for congestion.  For your congestion, I have prescribed Fluticasone  nasal spray one spray in each nostril twice a day. This will help with sinus congestion and ear fulliness.  If you do not have a history of heart disease, hypertension, diabetes or thyroid disease, prostate/bladder issues or glaucoma, you may also use Sudafed to treat nasal congestion.  It is highly recommended that you consult with a pharmacist or your primary care physician to ensure this medication is safe for you to take.     If you have a cough, you may use cough suppressants such as Delsym and Robitussin.  If you have glaucoma or high blood pressure, you can also use Coricidin HBP.    If you have a sore or scratchy throat, use a saltwater gargle-  to  teaspoon of salt dissolved in a 4-ounce to 8-ounce glass of warm water.  Gargle the solution for approximately 15-30 seconds and then spit.  It is important not to swallow the solution.  You can also use throat lozenges/cough drops and Chloraseptic spray to help with throat pain or discomfort.  Warm or cold liquids can also be helpful in relieving throat pain.  For headache, pain or general discomfort, you can use Ibuprofen or Tylenol as directed.   Some authorities believe that zinc sprays or the use of Echinacea may shorten the course of your symptoms.   HOME CARE . Only take medications as instructed by your medical team. .  Be sure to drink plenty of fluids. Water is fine as well as fruit juices, sodas and electrolyte beverages. You may want to stay away from caffeine or alcohol. If you are nauseated, try taking small sips of liquids. How do you know if you are getting enough fluid? Your urine should be a pale yellow or almost colorless. . Get rest. . Taking a steamy shower or using a humidifier may help nasal congestion and ease sore throat pain. You can place a towel over your head and breathe in the steam from hot water coming from a faucet. . Using a saline nasal spray works much the same way. . Cough drops, hard candies and sore throat  lozenges may ease your cough. . Avoid close contacts especially the very young and the elderly . Cover your mouth if you cough or sneeze . Always remember to wash your hands.   GET HELP RIGHT AWAY IF: . You develop worsening fever. . If your symptoms do not improve within 10 days . You develop yellow or green discharge from your nose over 3 days. . You have coughing fits . You develop a severe head ache or visual changes. . You develop shortness of breath, difficulty breathing or start having chest pain . Your symptoms persist after you have completed your treatment plan  MAKE SURE YOU   Understand these instructions.  Will watch your condition.  Will get help right away if you are not doing well or get worse.  Your e-visit answers were reviewed by a board certified advanced clinical practitioner to complete your personal care plan. Depending upon the condition, your plan could have included both over the counter or prescription medications. Please review your pharmacy choice. If there is a problem, you may call our nursing hot line at and have the prescription routed to another pharmacy. Your safety is important to [place]. If you have drug allergies check your prescription carefully.   You can use MyChart to ask questions about today's visit, request a non-urgent call back, or ask for a work or school excuse for 24 hours related to this e-Visit. If it has been greater than 24 hours you will need to follow up with your provider, or enter a new e-Visit to address those concerns. You will get an e-mail in the next two days asking about your experience.  I hope that your e-visit has been valuable and will speed your recovery. Thank you for using e-visits.  5 minutes spent reviewing and documenting in chart.

## 2019-06-19 ENCOUNTER — Encounter: Payer: Self-pay | Admitting: Family Medicine

## 2019-06-19 DIAGNOSIS — H9193 Unspecified hearing loss, bilateral: Secondary | ICD-10-CM

## 2019-06-19 NOTE — Telephone Encounter (Signed)
Referral pended

## 2019-07-03 ENCOUNTER — Other Ambulatory Visit: Payer: Self-pay | Admitting: Family Medicine

## 2019-07-03 MED FILL — SERTRALINE HCL 100 MG TAB: 100 | 90 days supply | Qty: 135 | Fill #0

## 2019-09-20 MED FILL — KETOCONAZOLE 2% CREAM: 2 | 20 days supply | Qty: 15 | Fill #0

## 2019-12-28 MED FILL — SERTRALINE HCL 100 MG TAB: 100 | 90 days supply | Qty: 135 | Fill #1

## 2020-01-02 ENCOUNTER — Encounter: Payer: No Typology Code available for payment source | Admitting: Family Medicine

## 2020-01-12 ENCOUNTER — Ambulatory Visit (INDEPENDENT_AMBULATORY_CARE_PROVIDER_SITE_OTHER): Payer: No Typology Code available for payment source | Admitting: Family Medicine

## 2020-01-12 ENCOUNTER — Other Ambulatory Visit: Payer: Self-pay

## 2020-01-12 ENCOUNTER — Encounter: Payer: Self-pay | Admitting: Family Medicine

## 2020-01-12 VITALS — BP 131/82 | HR 88 | Ht 65.0 in | Wt 188.0 lb

## 2020-01-12 DIAGNOSIS — R590 Localized enlarged lymph nodes: Secondary | ICD-10-CM | POA: Diagnosis not present

## 2020-01-12 DIAGNOSIS — Z0000 Encounter for general adult medical examination without abnormal findings: Secondary | ICD-10-CM

## 2020-01-12 DIAGNOSIS — F411 Generalized anxiety disorder: Secondary | ICD-10-CM | POA: Diagnosis not present

## 2020-01-12 MED ORDER — CLONAZEPAM 0.25 MG PO TBDP: ORAL_TABLET | ORAL | 1 refills | Status: DC

## 2020-01-12 MED ORDER — SERTRALINE HCL 100 MG PO TABS: 150.0000 mg | ORAL_TABLET | Freq: Every day | ORAL | 2 refills | Status: DC

## 2020-01-12 MED FILL — clonazePAM 0.25 MG TBDP: 0.25 | 3 days supply | Qty: 10 | Fill #0

## 2020-01-12 NOTE — Patient Instructions (Signed)
Health Maintenance, Female Adopting a healthy lifestyle and getting preventive care are important in promoting health and wellness. Ask your health care provider about:  The right schedule for you to have regular tests and exams.  Things you can do on your own to prevent diseases and keep yourself healthy. What should I know about diet, weight, and exercise? Eat a healthy diet   Eat a diet that includes plenty of vegetables, fruits, low-fat dairy products, and lean protein.  Do not eat a lot of foods that are high in solid fats, added sugars, or sodium. Maintain a healthy weight Body mass index (BMI) is used to identify weight problems. It estimates body fat based on height and weight. Your health care provider can help determine your BMI and help you achieve or maintain a healthy weight. Get regular exercise Get regular exercise. This is one of the most important things you can do for your health. Most adults should:  Exercise for at least 150 minutes each week. The exercise should increase your heart rate and make you sweat (moderate-intensity exercise).  Do strengthening exercises at least twice a week. This is in addition to the moderate-intensity exercise.  Spend less time sitting. Even light physical activity can be beneficial. Watch cholesterol and blood lipids Have your blood tested for lipids and cholesterol at 47 years of age, then have this test every 5 years. Have your cholesterol levels checked more often if:  Your lipid or cholesterol levels are high.  You are older than 47 years of age.  You are at high risk for heart disease. What should I know about cancer screening? Depending on your health history and family history, you may need to have cancer screening at various ages. This may include screening for:  Breast cancer.  Cervical cancer.  Colorectal cancer.  Skin cancer.  Lung cancer. What should I know about heart disease, diabetes, and high blood  pressure? Blood pressure and heart disease  High blood pressure causes heart disease and increases the risk of stroke. This is more likely to develop in people who have high blood pressure readings, are of African descent, or are overweight.  Have your blood pressure checked: ? Every 3-5 years if you are 18-39 years of age. ? Every year if you are 40 years old or older. Diabetes Have regular diabetes screenings. This checks your fasting blood sugar level. Have the screening done:  Once every three years after age 40 if you are at a normal weight and have a low risk for diabetes.  More often and at a younger age if you are overweight or have a high risk for diabetes. What should I know about preventing infection? Hepatitis B If you have a higher risk for hepatitis B, you should be screened for this virus. Talk with your health care provider to find out if you are at risk for hepatitis B infection. Hepatitis C Testing is recommended for:  Everyone born from 1945 through 1965.  Anyone with known risk factors for hepatitis C. Sexually transmitted infections (STIs)  Get screened for STIs, including gonorrhea and chlamydia, if: ? You are sexually active and are younger than 47 years of age. ? You are older than 47 years of age and your health care provider tells you that you are at risk for this type of infection. ? Your sexual activity has changed since you were last screened, and you are at increased risk for chlamydia or gonorrhea. Ask your health care provider if   you are at risk.  Ask your health care provider about whether you are at high risk for HIV. Your health care provider may recommend a prescription medicine to help prevent HIV infection. If you choose to take medicine to prevent HIV, you should first get tested for HIV. You should then be tested every 3 months for as long as you are taking the medicine. Pregnancy  If you are about to stop having your period (premenopausal) and  you may become pregnant, seek counseling before you get pregnant.  Take 400 to 800 micrograms (mcg) of folic acid every day if you become pregnant.  Ask for birth control (contraception) if you want to prevent pregnancy. Osteoporosis and menopause Osteoporosis is a disease in which the bones lose minerals and strength with aging. This can result in bone fractures. If you are 65 years old or older, or if you are at risk for osteoporosis and fractures, ask your health care provider if you should:  Be screened for bone loss.  Take a calcium or vitamin D supplement to lower your risk of fractures.  Be given hormone replacement therapy (HRT) to treat symptoms of menopause. Follow these instructions at home: Lifestyle  Do not use any products that contain nicotine or tobacco, such as cigarettes, e-cigarettes, and chewing tobacco. If you need help quitting, ask your health care provider.  Do not use street drugs.  Do not share needles.  Ask your health care provider for help if you need support or information about quitting drugs. Alcohol use  Do not drink alcohol if: ? Your health care provider tells you not to drink. ? You are pregnant, may be pregnant, or are planning to become pregnant.  If you drink alcohol: ? Limit how much you use to 0-1 drink a day. ? Limit intake if you are breastfeeding.  Be aware of how much alcohol is in your drink. In the [place] one drink equals one 12 oz bottle of beer (355 mL), one 5 oz glass of wine (148 mL), or one 1 oz glass of hard liquor (44 mL). General instructions  Schedule regular health, dental, and eye exams.  Stay current with your vaccines.  Tell your health care provider if: ? You often feel depressed. ? You have ever been abused or do not feel safe at home. Summary  Adopting a healthy lifestyle and getting preventive care are important in promoting health and wellness.  Follow your health care provider's instructions about healthy  diet, exercising, and getting tested or screened for diseases.  Follow your health care provider's instructions on monitoring your cholesterol and blood pressure. This information is not intended to replace advice given to you by your health care provider. Make sure you discuss any questions you have with your health care provider. Document Revised: 11/02/2018 Document Reviewed: 11/02/2018 Elsevier Patient Education  2020 Elsevier Inc.  

## 2020-01-12 NOTE — Progress Notes (Signed)
Subjective:     Sue [name] is a 49 y.o. female and is here for a comprehensive physical exam. The patient reports no problems.  She is overall doing well overall.  She has been working at home since [employer] hit.  She says that is been a little bit stressful at times but overall feels like she is dealing with it well.  She is currently on sertraline taking 1-1/2 tabs daily.  Her kids have been struggling with online school some.  He is otherwise though happy with her regimen.  She did request a refill on her clonazepam.  We have not refilled it since 2019 so she uses it very sparingly.  She did want to let me know that she did have her Covid vaccination series and says after her second vaccine she actually noticed some swelling in the left axilla as well as on the left side of her neck.  She says it is actually better than it was and does not seem nearly as swollen and she said she noticed it the day after the immunization and just wanted to mention it.   She did get her Pap smear and dermatology appointment in December and is up-to-date on that.  She has not scheduled her follow-up mammogram yet but plans to.  Social History   Socioeconomic History  . Marital status: Married    Spouse name: Not on file  . Number of children: Not on file  . Years of education: Not on file  . Highest education level: Not on file  Occupational History  . Occupation: [employer].     Comment: Advertising copywriter   Tobacco Use  . Smoking status: Never Smoker  . Smokeless tobacco: Never Used  Substance and Sexual Activity  . Alcohol use: Yes    Comment: couple drinks per week  . Drug use: No  . Sexual activity: Not on file  Other Topics Concern  . Not on file  Social History Narrative  . Not on file   Social Determinants of Health   Financial Resource Strain:   . Difficulty of Paying Living Expenses: Not on file  Food Insecurity:   . Worried About Charity fundraiser in the Last Year: Not on file   . Ran Out of Food in the Last Year: Not on file  Transportation Needs:   . Lack of Transportation (Medical): Not on file  . Lack of Transportation (Non-Medical): Not on file  Physical Activity:   . Days of Exercise per Week: Not on file  . Minutes of Exercise per Session: Not on file  Stress:   . Feeling of Stress : Not on file  Social Connections:   . Frequency of Communication with Friends and Family: Not on file  . Frequency of Social Gatherings with Friends and Family: Not on file  . Attends Religious Services: Not on file  . Active Member of Clubs or Organizations: Not on file  . Attends Archivist Meetings: Not on file  . Marital Status: Not on file  Intimate Partner Violence:   . Fear of Current or Ex-Partner: Not on file  . Emotionally Abused: Not on file  . Physically Abused: Not on file  . Sexually Abused: Not on file   Health Maintenance  Topic Date Due  . URINE MICROALBUMIN  09/17/1983  . TETANUS/TDAP  07/09/2021  . PAP SMEAR-Modifier  08/17/2021  . INFLUENZA VACCINE  Completed  . HIV Screening  Completed    The  following portions of the patient's history were reviewed and updated as appropriate: allergies, current medications, past family history, past medical history, past social history, past surgical history and problem list.  Review of Systems A comprehensive review of systems was negative.   Objective:    BP 131/82   Pulse 88   Ht 5' 5"  (1.651 m)   Wt 188 lb (85.3 kg)   SpO2 100%   BMI 31.28 kg/m  General appearance: alert, cooperative and appears stated age Head: Normocephalic, without obvious abnormality, atraumatic Eyes: conj clear, EOMI, PEERLA Ears: normal TM's and external ear canals both ears Nose: Nares normal. Septum midline. Mucosa normal. No drainage or sinus tenderness. Throat: lips, mucosa, and tongue normal; teeth and gums normal Neck: no adenopathy, no carotid bruit, no JVD, supple, symmetrical, trachea midline and  thyroid not enlarged, symmetric, no tenderness/mass/nodules Back: symmetric, no curvature. ROM normal. No CVA tenderness. Lungs: clear to auscultation bilaterally Heart: regular rate and rhythm, S1, S2 normal, no murmur, click, rub or gallop Abdomen: soft, non-tender; bowel sounds normal; no masses,  no organomegaly Extremities: extremities normal, atraumatic, no cyanosis or edema Pulses: 2+ and symmetric Skin: Skin color, texture, turgor normal. No rashes or lesions Lymph nodes: Axillary adenopathy: a couple of approx 1.5 cm LN in the left axilla. none on the right Neurologic: Alert and oriented X 3, normal strength and tone. Normal symmetric reflexes. Normal coordination and gait    Assessment:    Healthy female exam.      Plan:     See After Visit Summary for Counseling Recommendations   Keep up a regular exercise program and make sure you are eating a healthy diet Try to eat 4 servings of dairy a day, or if you are lactose intolerant take a calcium with vitamin D daily.  Your vaccines are up to date.  Reminded her that she is overdue for her mammogram and encouraged her to schedule as soon as possible.  Left axillary lymphadenopathy-I was able to palpate this on exam today it seems to be fairly mild but we will check a CBC with her labs.  Did encourage her to keep an eye on it over the next couple of weeks and see if it gradually resolves.  I was unable to palpate any discrete nodes in the cervical area.  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

## 2020-01-24 ENCOUNTER — Encounter: Payer: Self-pay | Admitting: Family Medicine

## 2020-02-13 ENCOUNTER — Other Ambulatory Visit: Payer: Self-pay

## 2020-02-13 DIAGNOSIS — J45909 Unspecified asthma, uncomplicated: Secondary | ICD-10-CM | POA: Diagnosis not present

## 2020-02-13 DIAGNOSIS — N83201 Unspecified ovarian cyst, right side: Secondary | ICD-10-CM | POA: Diagnosis not present

## 2020-02-13 DIAGNOSIS — R102 Pelvic and perineal pain: Secondary | ICD-10-CM | POA: Diagnosis present

## 2020-02-13 DIAGNOSIS — Z79899 Other long term (current) drug therapy: Secondary | ICD-10-CM | POA: Diagnosis not present

## 2020-02-13 DIAGNOSIS — N83202 Unspecified ovarian cyst, left side: Secondary | ICD-10-CM | POA: Diagnosis not present

## 2020-02-13 MED ORDER — SODIUM CHLORIDE 0.9% FLUSH
3.0000 mL | Freq: Once | INTRAVENOUS | Status: AC
  Administered 2020-02-14: 3 mL via INTRAVENOUS

## 2020-02-13 NOTE — ED Triage Notes (Addendum)
Patient is brought by [facility] from home. Patient is complaining of lower right abdominal pain that radiates to her flank. Patient has 8 mg of zofran and 272mg of fenatyl.

## 2020-02-14 ENCOUNTER — Emergency Department (HOSPITAL_COMMUNITY): Payer: No Typology Code available for payment source

## 2020-02-14 ENCOUNTER — Other Ambulatory Visit: Payer: Self-pay

## 2020-02-14 ENCOUNTER — Emergency Department (HOSPITAL_COMMUNITY)
Admission: EM | Admit: 2020-02-14 | Discharge: 2020-02-14 | Disposition: A | Discharge status: Home / Self Care | Payer: No Typology Code available for payment source | Attending: Emergency Medicine | Admitting: Emergency Medicine

## 2020-02-14 ENCOUNTER — Encounter (HOSPITAL_COMMUNITY): Payer: Self-pay | Admitting: Emergency Medicine

## 2020-02-14 DIAGNOSIS — N83201 Unspecified ovarian cyst, right side: Secondary | ICD-10-CM

## 2020-02-14 DIAGNOSIS — R102 Pelvic and perineal pain: Secondary | ICD-10-CM

## 2020-02-14 LAB — COMPREHENSIVE METABOLIC PANEL
ALT: 21 U/L (ref 0–44)
AST: 23 U/L (ref 15–41)
Albumin: 4.4 g/dL (ref 3.5–5.0)
Alkaline Phosphatase: 52 U/L (ref 38–126)
Anion gap: 12 (ref 5–15)
BUN: 15 mg/dL (ref 6–20)
CO2: 22 mmol/L (ref 22–32)
Calcium: 8.9 mg/dL (ref 8.9–10.3)
Chloride: 105 mmol/L (ref 98–111)
Creatinine, Ser: 0.74 mg/dL (ref 0.44–1.00)
GFR calc Af Amer: >60 mL/min (ref >60)
GFR calc non Af Amer: >60 mL/min (ref >60)
Glucose, Bld: 205 mg/dL — ABNORMAL HIGH (ref 70–99)
Potassium: 3.2 mmol/L — ABNORMAL LOW (ref 3.5–5.1)
Sodium: 139 mmol/L (ref 135–145)
Total Bilirubin: 1 mg/dL (ref 0.3–1.2)
Total Protein: 7.3 g/dL (ref 6.5–8.1)

## 2020-02-14 LAB — URINALYSIS, ROUTINE W REFLEX MICROSCOPIC
Bilirubin Urine: NEGATIVE
Glucose, UA: 150 mg/dL — AB
Hgb urine dipstick: NEGATIVE
Ketones, ur: 80 mg/dL — AB
Leukocytes,Ua: NEGATIVE
Nitrite: NEGATIVE
Protein, ur: NEGATIVE mg/dL
Specific Gravity, Urine: 1.021 (ref 1.005–1.030)
pH: 7 (ref 5.0–8.0)

## 2020-02-14 LAB — CBC
HCT: 39.2 % (ref 36.0–46.0)
Hemoglobin: 13.4 g/dL (ref 12.0–15.0)
MCH: 31.2 pg (ref 26.0–34.0)
MCHC: 34.2 g/dL (ref 30.0–36.0)
MCV: 91.4 fL (ref 80.0–100.0)
Platelets: 156 10*3/uL (ref 150–400)
RBC: 4.29 MIL/uL (ref 3.87–5.11)
RDW: 11.6 % (ref 11.5–15.5)
WBC: 16.6 10*3/uL — ABNORMAL HIGH (ref 4.0–10.5)
nRBC: 0 % (ref 0.0–0.2)

## 2020-02-14 LAB — I-STAT BETA HCG BLOOD, ED (MC, WL, AP ONLY): I-stat hCG, quantitative: <5 m[IU]/mL (ref <5)

## 2020-02-14 LAB — LIPASE, BLOOD: Lipase: 26 U/L (ref 11–51)

## 2020-02-14 MED ORDER — ONDANSETRON HCL 4 MG/2ML IJ SOLN
4.0000 mg | Freq: Once | INTRAMUSCULAR | Status: AC
  Administered 2020-02-14: 4 mg via INTRAVENOUS
  Filled 2020-02-14: qty 2

## 2020-02-14 MED ORDER — ONDANSETRON 4 MG PO TBDP: 4.0000 mg | ORAL_TABLET | Freq: Three times a day (TID) | ORAL | 0 refills | Status: DC | PRN

## 2020-02-14 MED ORDER — HYDROMORPHONE HCL 1 MG/ML IJ SOLN
0.5000 mg | Freq: Once | INTRAMUSCULAR | Status: AC
  Administered 2020-02-14: 0.5 mg via INTRAVENOUS
  Filled 2020-02-14: qty 1

## 2020-02-14 MED ORDER — HYDROCODONE-ACETAMINOPHEN 5-325 MG PO TABS: 1.0000 | ORAL_TABLET | ORAL | 0 refills | Status: DC | PRN

## 2020-02-14 MED ORDER — METOCLOPRAMIDE HCL 5 MG/ML IJ SOLN
10.0000 mg | Freq: Once | INTRAMUSCULAR | Status: AC
  Administered 2020-02-14: 10 mg via INTRAVENOUS
  Filled 2020-02-14: qty 2

## 2020-02-14 MED FILL — HYDROCODON-APAP 5-325: 5-325 | 2 days supply | Qty: 12 | Fill #0

## 2020-02-14 MED FILL — ONDANSETRON ODT 4 MG TABLET: 4 | 3 days supply | Qty: 10 | Fill #0

## 2020-02-14 NOTE — Discharge Instructions (Addendum)
As we discussed, your imaging today did show evidence of bilateral ovarian cyst but there were no complicating features such as ovarian torsion. Take the prescribed medication as directed.  Do not drive while taking pain medication it, it can make you drowsy. Follow-up with OB/GYN, it is recommended that you have repeat ultrasound in 6 to 12 weeks for close monitoring of cysts. Return to the ED for new or worsening symptoms.

## 2020-02-14 NOTE — ED Notes (Signed)
Ultrasound at bedside

## 2020-02-14 NOTE — ED Provider Notes (Signed)
[place] DEPT Provider Note   CSN: [id number] Arrival date & time: 02/13/20  2348     History Chief Complaint  Patient presents with  . Abdominal Pain    Sue [name] is a 49 y.o. female.  The history is provided by the patient and medical records.  Abdominal Pain Associated symptoms: nausea and vomiting     49 year old female with history of anxiety, asthma, presenting to the ED with sudden onset right lower abdominal pain around 10 PM.  She has had associated nausea and vomiting.  She has not had any noted fever or urinary symptoms.  States pain does radiate a little bit into her right flank.  She has never had pain like this before.  No history of kidney stones.  She did receive fentanyl and Zofran in route with some mild improvement of her symptoms but pain is starting to return.  Past Medical History:  Diagnosis Date  . Anxiety   . Asthma   . Basal cell carcinoma of skin of face 2007   Dr. Delman Cheadle    Patient Active Problem List   Diagnosis Date Noted  . Basal cell carcinoma of skin 08/16/2018  . Right knee pain 08/05/2017  . Low back pain 08/16/2015  . Right ankle pain 09/13/2012  . Right leg pain 08/08/2012  . Cyst of left ovary 12/26/2011  . Muscle spasm of calf 05/22/2011  . Menses, irregular 09/17/2010  . ANXIETY DISORDER, GENERALIZED 07/30/2009  . Asthma 11/12/2008    Past Surgical History:  Procedure Laterality Date  . MOHS SURGERY     removal of basal cell carcinoma     OB History   No obstetric history on file.     Family History  Problem Relation Age of Onset  . Hypertension Mother   . Hyperlipidemia Father   . Hypertension Father   . Diabetes Maternal Grandfather   . Depression Sister     Social History   Tobacco Use  . Smoking status: Never Smoker  . Smokeless tobacco: Never Used  Substance Use Topics  . Alcohol use: Yes    Comment: couple drinks per week  . Drug use: No    Home  Medications Prior to Admission medications   Medication Sig Start Date End Date Taking? Authorizing Provider  clonazePAM (KLONOPIN) 0.25 MG disintegrating tablet Take one tab by mouth one to three times daily as needed for anxiety 01/12/20   Hali Marry, MD  fluticasone (FLONASE) 50 MCG/ACT nasal spray Place 2 sprays into both nostrils daily. 02/03/19   Hassell Done Mary-Margaret, FNP  levonorgestrel (MIRENA, 52 MG,) 20 MCG/24HR IUD 20 mcg by Intrauterine route once.    [provider]  pseudoephedrine (SUDAFED) 30 MG tablet Take 30 mg by mouth every 4 (four) hours as needed for congestion.    [provider]  sertraline (ZOLOFT) 100 MG tablet Take 1.5 tablets (150 mg total) by mouth daily. 01/12/20   Hali Marry, MD    Allergies    Patient has no known allergies.  Review of Systems   Review of Systems  Gastrointestinal: Positive for nausea and vomiting.  Genitourinary: Positive for flank pain.  All other systems reviewed and are negative.   Physical Exam Updated Vital Signs BP (!) 141/79   Pulse (!) 52   Resp 18   Ht 5' 5"  (1.651 m)   Wt 79.4 kg   SpO2 94%   BMI 29.12 kg/m   Physical Exam Vitals and nursing  note reviewed.  Constitutional:      Appearance: She is well-developed.  HENT:     Head: Normocephalic and atraumatic.  Eyes:     Conjunctiva/sclera: Conjunctivae normal.     Pupils: Pupils are equal, round, and reactive to light.  Cardiovascular:     Rate and Rhythm: Normal rate and regular rhythm.     Heart sounds: Normal heart sounds.  Pulmonary:     Effort: Pulmonary effort is normal.     Breath sounds: Normal breath sounds.  Abdominal:     General: Bowel sounds are normal.     Palpations: Abdomen is soft.     Tenderness: There is no abdominal tenderness. There is no guarding or rebound.  Musculoskeletal:        General: Normal range of motion.     Cervical back: Normal range of motion.  Skin:    General: Skin is warm and  dry.  Neurological:     Mental Status: She is alert and oriented to person, place, and time.     ED Results / Procedures / Treatments   Labs (all labs ordered are listed, but only abnormal results are displayed) Labs Reviewed  COMPREHENSIVE METABOLIC PANEL - Abnormal; Notable for the following components:      Result Value   Potassium 3.2 (*)    Glucose, Bld 205 (*)    All other components within normal limits  CBC - Abnormal; Notable for the following components:   WBC 16.6 (*)    All other components within normal limits  URINALYSIS, ROUTINE W REFLEX MICROSCOPIC - Abnormal; Notable for the following components:   Glucose, UA 150 (*)    Ketones, ur 80 (*)    All other components within normal limits  LIPASE, BLOOD  I-STAT BETA HCG BLOOD, ED ([facility], [facility], [facility] ONLY)    EKG None  Radiology CT Renal Stone Study  Result Date: 02/14/2020 CLINICAL DATA:  Right lower quadrant pain, right flank pain EXAM: CT ABDOMEN AND PELVIS WITHOUT CONTRAST TECHNIQUE: Multidetector CT imaging of the abdomen and pelvis was performed following the standard protocol without IV contrast. COMPARISON:  None. FINDINGS: Lower chest: Lung bases are clear. No effusions. Heart is normal size. Hepatobiliary: No focal hepatic abnormality. Gallbladder unremarkable. Pancreas: No focal abnormality or ductal dilatation. Spleen: No focal abnormality.  Normal size. Adrenals/Urinary Tract: No adrenal abnormality. No focal renal abnormality. No stones or hydronephrosis. Urinary bladder is unremarkable. Stomach/Bowel: Stomach, large and small bowel grossly unremarkable. Appendix is normal. Vascular/Lymphatic: No evidence of aneurysm or adenopathy. Reproductive: Complex cystic areas noted in the cul-de-sac, likely arising from the right ovary. Probable adjacent left ovarian cyst as well. Uterus unremarkable. IUD noted in place. Other: Small amount of free2 fluid in the pelvis.  No free air. Musculoskeletal: No acute bony abnormality.  IMPRESSION: Complex cystic structures within the cul-de-sac, likely arising from the right ovary. Left ovarian cyst also noted in the left adnexa. These could be further evaluated with pelvic ultrasound. No renal or ureteral stones.  No hydronephrosis. Electronically Signed   By: Rolm Baptise M.D.   On: 02/14/2020 02:44   US PELVIC COMPLETE W TRANSVAGINAL AND TORSION R/O  Result Date: 02/14/2020 CLINICAL DATA:  Initial evaluation for acute pelvic pain for 1 day. EXAM: TRANSABDOMINAL AND TRANSVAGINAL ULTRASOUND OF PELVIS DOPPLER ULTRASOUND OF OVARIES TECHNIQUE: Both transabdominal and transvaginal ultrasound examinations of the pelvis were performed. Transabdominal technique was performed for global imaging of the pelvis including uterus, ovaries, adnexal regions, and pelvic cul-de-sac.  It was necessary to proceed with endovaginal exam following the transabdominal exam to visualize the uterus, endometrium, and ovaries. Color and duplex Doppler ultrasound was utilized to evaluate blood flow to the ovaries. COMPARISON:  Prior CT from earlier the same day. FINDINGS: Uterus Measurements: 9.3 x 4.3 x 5.0 cm = volume: 103.3 mL. No fibroids or other mass visualized. Endometrium Thickness: 8.2 mm. No focal abnormality visualized. IUD in appropriate position within the endometrial cavity. Right ovary Measurements: 7.0 x 3.2 x 5.1 cm = volume: 59.1 mL. Few scattered simple cysts noted, largest of which measures 3.0 x 2.2 x 2.9 cm, most consistent with normal physiologic follicular cysts/dominant follicles. Left ovary Measurements: 4.6 x 2.3 x 3.5 cm = volume: 18.9 mL. There are 2 adjacent simple cysts versus a single cyst with internal septation measuring up to 3.5 cm. No other significant internal complexity or definite vascularity. Pulsed Doppler evaluation of both ovaries demonstrates normal low-resistance arterial and venous waveforms. Other findings Small volume free fluid within the pelvis, most likely physiologic.  IMPRESSION: 1. Two adjacent simple cysts versus a single cyst with internal septation measuring up to 3.5 cm within the left ovary. While these are likely benign, a short interval follow-up ultrasound in 6-12 weeks is recommended for further evaluation. 2. Few simple right renal cysts measuring up to 3 cm, most consistent with benign physiologic follicular cysts/dominant follicles. 3. Small volume free physiologic fluid within the pelvis. 4. No evidence for ovarian torsion. 5. IUD in appropriate position within the endometrial cavity. Electronically Signed   By: Jeannine Boga M.D.   On: 02/14/2020 04:30    Procedures Procedures (including critical care time)  Medications Ordered in ED Medications  sodium chloride flush (NS) 0.9 % injection 3 mL (3 mLs Intravenous Given 02/14/20 0028)  HYDROmorphone (DILAUDID) injection 0.5 mg (0.5 mg Intravenous Given 02/14/20 0109)  metoCLOPramide (REGLAN) injection 10 mg (10 mg Intravenous Given 02/14/20 0109)  ondansetron (ZOFRAN) injection 4 mg (4 mg Intravenous Given 02/14/20 0416)  HYDROmorphone (DILAUDID) injection 0.5 mg (0.5 mg Intravenous Given 02/14/20 0416)    ED Course  I have reviewed the triage vital signs and the nursing notes.  Pertinent labs & imaging results that were available during my care of the patient were reviewed by me and considered in my medical decision making (see chart for details).    MDM Rules/Calculators/A&P  49 y.o. F here with sudden onset of right lower abdominal pain at 10PM.  Has had some nausea and vomiting associated.  Denies any urinary symptoms.  She is afebrile and nontoxic in appearance here.  She does appear somewhat uncomfortable.  Screening labs are reassuring.  CT renal study was obtained with concern of kidney stone, no urine renal or ureteral stones noted, however does have complex cystic structures within the cul-de-sac, seem to be arising from right ovary, also noted left ovarian cyst.  Results discussed  with patient.  She thinks within the past year or so she had an ultrasound that was concerning for cyst as well.  I have no record of this and unable to visualize the images.  States this feels very different than the pain she was experiencing from her cyst.  Given this, will repeat ultrasound today to ensure no complicating features such as torsion.  Patient is agreeable.  Pain is well controlled at this time.  Ultrasound obtained here today, multiple simple cyst in right ovary along with 2 adjacent simple cyst versus single septated cysts measuring up to 3.5 cm  within left ovary.  No evidence of torsion or other complicating features.  Patient will need close follow-up with OB/GYN for repeat ultrasound within the next 6 to 12 weeks.  Rx for pain and nausea medication for home, she was advised on use of narcotic medications.  Can also follow-up with PCP.  She will return here for any new or acute changes.  Final Clinical Impression(s) / ED Diagnoses Final diagnoses:  Pelvic pain  Bilateral ovarian cysts    Rx / DC Orders ED Discharge Orders         Ordered    HYDROcodone-acetaminophen (NORCO/VICODIN) 5-325 MG tablet  Every 4 hours PRN     02/14/20 0526    ondansetron (ZOFRAN ODT) 4 MG disintegrating tablet  Every 8 hours PRN     02/14/20 0526           Larene Pickett, PA-C 02/14/20 0601    Orpah Greek, MD 02/14/20 [phone]

## 2020-02-15 ENCOUNTER — Encounter: Payer: Self-pay | Admitting: Family Medicine

## 2020-02-19 ENCOUNTER — Inpatient Hospital Stay: Payer: No Typology Code available for payment source | Admitting: Family Medicine

## 2020-02-20 ENCOUNTER — Encounter: Payer: Self-pay | Admitting: Family Medicine

## 2020-02-20 ENCOUNTER — Other Ambulatory Visit: Payer: Self-pay

## 2020-02-20 ENCOUNTER — Ambulatory Visit (INDEPENDENT_AMBULATORY_CARE_PROVIDER_SITE_OTHER): Payer: No Typology Code available for payment source | Admitting: Family Medicine

## 2020-02-20 VITALS — BP 114/58 | HR 77 | Temp 98.2°F | Ht 64.96 in | Wt 176.0 lb

## 2020-02-20 DIAGNOSIS — R1031 Right lower quadrant pain: Secondary | ICD-10-CM | POA: Diagnosis not present

## 2020-02-20 DIAGNOSIS — Z800 Family history of malignant neoplasm of digestive organs: Secondary | ICD-10-CM

## 2020-02-20 DIAGNOSIS — R102 Pelvic and perineal pain: Secondary | ICD-10-CM

## 2020-02-20 DIAGNOSIS — N83202 Unspecified ovarian cyst, left side: Secondary | ICD-10-CM

## 2020-02-20 DIAGNOSIS — N83201 Unspecified ovarian cyst, right side: Secondary | ICD-10-CM | POA: Diagnosis not present

## 2020-02-20 NOTE — Progress Notes (Signed)
Established Patient Office Visit  Subjective:  Patient ID: Sue [name], female    DOB: 19-Jul-1971  Age: 49 y.o. MRN: [id number]  CC: No chief complaint on file.   HPI   Sue [name] presents for F/U recent emergency department visit.  She had sudden onset of right lower quadrant pain associated with nausea and vomiting.  She went to the emergency department on March 24.  CT of the abdomen and pelvis did not indicate any type of kidney stone but did show a complex cyst on the right ovary.  Additional ultrasound revealedmultiple simple cyst in right ovary along with 2 adjacent simple cyst versus single septated cysts measuring up to 3.5 cm within left ovary.  No evidence of torsion or other complicating features. she has since followed up with her OB/GYN who recommended removal of her Mirena.  She says that her pain is overall feeling better but she still having a lot of pressure in the pelvic area on the right and the left side.  She says now it seems a bit more towards the left.  She did have cyst noted on both ovaries.  She just wants to make sure that everything is okay with her GI tract and her bowels.  She feels like she is stooling regularly.  Denies constipation or diarrhea no blood in the stool.  She does have a family history of colon polyps in both of her parents.  She thinks her dad may have had some precancerous polyps.  She also has a paternal grandmother who was diagnosed with colon cancer around age 36 and a maternal great-grandmother who was diagnosed with colon cancer around age 75.  Past Medical History:  Diagnosis Date  . Anxiety   . Asthma   . Basal cell carcinoma of skin of face 2007   Dr. Delman Cheadle    Past Surgical History:  Procedure Laterality Date  . MOHS SURGERY     removal of basal cell carcinoma    Family History  Problem Relation Age of Onset  . Hypertension Mother   . Hyperlipidemia Father   . Hypertension Father   . Diabetes Maternal Grandfather    . Depression Sister     Social History   Socioeconomic History  . Marital status: Married    Spouse name: Not on file  . Number of children: Not on file  . Years of education: Not on file  . Highest education level: Not on file  Occupational History  . Occupation: [employer].     Comment: Advertising copywriter   Tobacco Use  . Smoking status: Never Smoker  . Smokeless tobacco: Never Used  Substance and Sexual Activity  . Alcohol use: Yes    Comment: couple drinks per week  . Drug use: No  . Sexual activity: Not on file  Other Topics Concern  . Not on file  Social History Narrative  . Not on file   Social Determinants of Health   Financial Resource Strain:   . Difficulty of Paying Living Expenses:   Food Insecurity:   . Worried About Charity fundraiser in the Last Year:   . Arboriculturist in the Last Year:   Transportation Needs:   . Film/video editor (Medical):   Marland Kitchen Lack of Transportation (Non-Medical):   Physical Activity:   . Days of Exercise per Week:   . Minutes of Exercise per Session:   Stress:   . Feeling of Stress :  Social Connections:   . Frequency of Communication with Friends and Family:   . Frequency of Social Gatherings with Friends and Family:   . Attends Religious Services:   . Active Member of Clubs or Organizations:   . Attends Archivist Meetings:   Marland Kitchen Marital Status:   Intimate Partner Violence:   . Fear of Current or Ex-Partner:   . Emotionally Abused:   Marland Kitchen Physically Abused:   . Sexually Abused:     Outpatient Medications Prior to Visit  Medication Sig Dispense Refill  . albuterol (VENTOLIN HFA) 108 (90 Base) MCG/ACT inhaler Inhale 1-2 puffs into the lungs every 6 (six) hours as needed for wheezing or shortness of breath.    . clonazePAM (KLONOPIN) 0.25 MG disintegrating tablet Take one tab by mouth one to three times daily as needed for anxiety (Patient taking differently: Take 0.25 mg by mouth 3 (three) times daily as  needed (anxiety). ) 10 tablet 1  . fluticasone (FLONASE) 50 MCG/ACT nasal spray Place 2 sprays into both nostrils daily. 16 g 6  . HYDROcodone-acetaminophen (NORCO/VICODIN) 5-325 MG tablet Take 1 tablet by mouth every 4 (four) hours as needed. 12 tablet 0  . levonorgestrel (MIRENA, 52 MG,) 20 MCG/24HR IUD 20 mcg by Intrauterine route once.    . ondansetron (ZOFRAN ODT) 4 MG disintegrating tablet Take 1 tablet (4 mg total) by mouth every 8 (eight) hours as needed for nausea. 10 tablet 0  . sertraline (ZOLOFT) 100 MG tablet Take 1.5 tablets (150 mg total) by mouth daily. (Patient taking differently: Take 100 mg by mouth daily. ) 135 tablet 2   No facility-administered medications prior to visit.    No Known Allergies  ROS Review of Systems    Objective:    Physical Exam  Constitutional: She is oriented to person, place, and time. She appears well-developed and well-nourished.  HENT:  Head: Normocephalic and atraumatic.  Cardiovascular: Normal rate, regular rhythm and normal heart sounds.  Pulmonary/Chest: Effort normal and breath sounds normal.  Abdominal: Soft. Bowel sounds are normal. There is abdominal tenderness.  Tenderness in the left lower quadrant and right lower quadrant and suprapubic area on exam.  No rebound or guarding.  Neurological: She is alert and oriented to person, place, and time.  Skin: Skin is warm and dry.  Psychiatric: She has a normal mood and affect. Her behavior is normal.    BP (!) 114/58 (BP Location: Left Arm, Patient Position: Sitting, Cuff Size: Normal)   Pulse 77   Temp 98.2 F (36.8 C) (Oral)   Ht 5' 4.96" (1.65 m)   Wt 176 lb (79.8 kg)   SpO2 99%   BMI 29.32 kg/m  Wt Readings from Last 3 Encounters:  02/20/20 176 lb (79.8 kg)  02/14/20 175 lb (79.4 kg)  01/12/20 188 lb (85.3 kg)     There are no preventive care reminders to display for this patient.  There are no preventive care reminders to display for this patient.  Lab Results   Component Value Date   TSH 2.41 08/18/2016   Lab Results  Component Value Date   WBC 16.6 (H) 02/14/2020   HGB 13.4 02/14/2020   HCT 39.2 02/14/2020   MCV 91.4 02/14/2020   PLT 156 02/14/2020   Lab Results  Component Value Date   NA 139 02/14/2020   K 3.2 (L) 02/14/2020   CO2 22 02/14/2020   GLUCOSE 205 (H) 02/14/2020   BUN 15 02/14/2020   CREATININE 0.74 02/14/2020  BILITOT 1.0 02/14/2020   ALKPHOS 52 02/14/2020   AST 23 02/14/2020   ALT 21 02/14/2020   PROT 7.3 02/14/2020   ALBUMIN 4.4 02/14/2020   CALCIUM 8.9 02/14/2020   ANIONGAP 12 02/14/2020   Lab Results  Component Value Date   CHOL 116 (L) 08/18/2016   Lab Results  Component Value Date   HDL 47 08/18/2016   Lab Results  Component Value Date   LDLCALC 59 08/18/2016   Lab Results  Component Value Date   TRIG 51 08/18/2016   Lab Results  Component Value Date   CHOLHDL 2.5 08/18/2016   Lab Results  Component Value Date   HGBA1C 5.3 02/12/2017      Assessment & Plan:   Problem List Items Addressed This Visit      Endocrine   Cyst of left ovary    Other Visit Diagnoses    RLQ abdominal pain    -  Primary   Right ovarian cyst       Pelvic pain       Family history of colon cancer         Cysts on both ovaries-considering having her Mirena removed but has not quite decided.  We discussed that certainly it is an option to leave it in for now if she would like just to see if the pain seems resolved.  She was really hoping to get closer to 52 before having it removed.  Her husband has had a vasectomy so she is not worried about pregnancy but she uses it more to control her menstrual cycles which were heavy before having the IUD placed.  Pelvic pain/bilateral lower quadrant pain sounds like her bowels are moving normally no sign of constipation etc.  Abdomen is soft with normal bowel sounds.  We did discuss may be doing stool cards just to rule out any abnormal blood etc. since she does have a  family history of cancer as well as polyps.  She is 23 and I do not think she would quite qualify for early colon cancer screening.     No orders of the defined types were placed in this encounter.   Follow-up: Return if symptoms worsen or fail to improve.    Beatrice Lecher, MD

## 2020-05-14 ENCOUNTER — Other Ambulatory Visit: Payer: Self-pay | Admitting: Family Medicine

## 2020-05-14 DIAGNOSIS — Z1231 Encounter for screening mammogram for malignant neoplasm of breast: Secondary | ICD-10-CM

## 2020-05-23 ENCOUNTER — Other Ambulatory Visit: Payer: Self-pay

## 2020-05-23 ENCOUNTER — Ambulatory Visit
Admission: RE | Admit: 2020-05-23 | Discharge: 2020-05-23 | Disposition: A | Discharge status: Home / Self Care | Payer: No Typology Code available for payment source | Source: Ambulatory Visit | Attending: Family Medicine | Admitting: Family Medicine

## 2020-05-23 DIAGNOSIS — Z1231 Encounter for screening mammogram for malignant neoplasm of breast: Secondary | ICD-10-CM

## 2020-05-28 ENCOUNTER — Other Ambulatory Visit: Payer: Self-pay | Admitting: Family Medicine

## 2020-05-28 DIAGNOSIS — R928 Other abnormal and inconclusive findings on diagnostic imaging of breast: Secondary | ICD-10-CM

## 2020-06-05 ENCOUNTER — Ambulatory Visit: Payer: No Typology Code available for payment source

## 2020-06-05 ENCOUNTER — Other Ambulatory Visit: Payer: Self-pay

## 2020-06-05 ENCOUNTER — Ambulatory Visit
Admission: RE | Admit: 2020-06-05 | Discharge: 2020-06-05 | Disposition: A | Discharge status: Home / Self Care | Payer: No Typology Code available for payment source | Source: Ambulatory Visit | Attending: Family Medicine | Admitting: Family Medicine

## 2020-06-05 DIAGNOSIS — R928 Other abnormal and inconclusive findings on diagnostic imaging of breast: Secondary | ICD-10-CM

## 2020-07-11 ENCOUNTER — Other Ambulatory Visit: Payer: Self-pay | Admitting: Family Medicine

## 2020-07-11 ENCOUNTER — Ambulatory Visit (INDEPENDENT_AMBULATORY_CARE_PROVIDER_SITE_OTHER): Payer: No Typology Code available for payment source | Admitting: Family Medicine

## 2020-07-11 ENCOUNTER — Encounter: Payer: Self-pay | Admitting: Family Medicine

## 2020-07-11 VITALS — BP 128/62 | HR 71 | Ht 65.0 in | Wt 169.0 lb

## 2020-07-11 DIAGNOSIS — Z23 Encounter for immunization: Secondary | ICD-10-CM | POA: Diagnosis not present

## 2020-07-11 DIAGNOSIS — E876 Hypokalemia: Secondary | ICD-10-CM | POA: Diagnosis not present

## 2020-07-11 DIAGNOSIS — Z13220 Encounter for screening for lipoid disorders: Secondary | ICD-10-CM | POA: Diagnosis not present

## 2020-07-11 DIAGNOSIS — F411 Generalized anxiety disorder: Secondary | ICD-10-CM

## 2020-07-11 DIAGNOSIS — N926 Irregular menstruation, unspecified: Secondary | ICD-10-CM

## 2020-07-11 DIAGNOSIS — J4520 Mild intermittent asthma, uncomplicated: Secondary | ICD-10-CM

## 2020-07-11 MED ORDER — CLONAZEPAM 0.25 MG PO TBDP: 0.2500 mg | ORAL_TABLET | Freq: Every day | ORAL | 1 refills | Status: DC | PRN

## 2020-07-11 MED ORDER — SERTRALINE HCL 100 MG PO TABS: 100.0000 mg | ORAL_TABLET | Freq: Every day | ORAL | 1 refills | Status: DC

## 2020-07-11 MED FILL — clonazePAM 0.25 MG TBDP: 0.25 | 10 days supply | Qty: 10 | Fill #0

## 2020-07-11 MED FILL — SERTRALINE HCL 100 MG TABS: 100 | 90 days supply | Qty: 90 | Fill #0

## 2020-07-11 NOTE — Assessment & Plan Note (Signed)
Continue current regimen with sertraline 100 mg daily and as needed clonazepam which she uses very sparingly.  Refills are sent to pharmacy.  Follow-up 6 months.  PHQ-9 score 5 today and GAD-7 score of 8.

## 2020-07-11 NOTE — Progress Notes (Signed)
Established Patient Office Visit  Subjective:  Patient ID: Sue [name], female    DOB: [birth year]  Age: 49 y.o. MRN: [id number]  CC:  Chief Complaint  Patient presents with  . mood    HPI Sue [name] presents for follow-up of anxiety disorder.  Currently on sertraline and as needed clonazepam.  She is currently taking 100 mg daily.  PHQ-9 score 5 she says she is been struggling with sleep a little bit lately but says she is also been staying up a little bit longer there is been some stressful things that have happened over the summer.  She is otherwise happy with the medication and feels like she is actually doing really well she is been using the clonazepam very sparingly.  She says in fact she did not pick up the last refill but she would like a new prescription sent to the pharmacy.  She has not had a lipid screening in some time so is due for that as well.  Past Medical History:  Diagnosis Date  . Anxiety   . Asthma   . Basal cell carcinoma of skin of face 2007   Dr. Delman Cheadle    Past Surgical History:  Procedure Laterality Date  . MOHS SURGERY     removal of basal cell carcinoma    Family History  Problem Relation Age of Onset  . Hypertension Mother   . Hyperlipidemia Father   . Hypertension Father   . Diabetes Maternal Grandfather   . Depression Sister     Social History   Socioeconomic History  . Marital status: Married    Spouse name: Not on file  . Number of children: Not on file  . Years of education: Not on file  . Highest education level: Not on file  Occupational History  . Occupation: [employer].     Comment: Advertising copywriter   Tobacco Use  . Smoking status: Never Smoker  . Smokeless tobacco: Never Used  Vaping Use  . Vaping Use: Never used  Substance and Sexual Activity  . Alcohol use: Yes    Comment: couple drinks per week  . Drug use: No  . Sexual activity: Not on file  Other Topics Concern  . Not on file  Social History  Narrative  . Not on file   Social Determinants of Health   Financial Resource Strain:   . Difficulty of Paying Living Expenses: Not on file  Food Insecurity:   . Worried About Charity fundraiser in the Last Year: Not on file  . Ran Out of Food in the Last Year: Not on file  Transportation Needs:   . Lack of Transportation (Medical): Not on file  . Lack of Transportation (Non-Medical): Not on file  Physical Activity:   . Days of Exercise per Week: Not on file  . Minutes of Exercise per Session: Not on file  Stress:   . Feeling of Stress : Not on file  Social Connections:   . Frequency of Communication with Friends and Family: Not on file  . Frequency of Social Gatherings with Friends and Family: Not on file  . Attends Religious Services: Not on file  . Active Member of Clubs or Organizations: Not on file  . Attends Archivist Meetings: Not on file  . Marital Status: Not on file  Intimate Partner Violence:   . Fear of Current or Ex-Partner: Not on file  . Emotionally Abused: Not on file  . Physically Abused:  Not on file  . Sexually Abused: Not on file    Outpatient Medications Prior to Visit  Medication Sig Dispense Refill  . albuterol (VENTOLIN HFA) 108 (90 Base) MCG/ACT inhaler Inhale 1-2 puffs into the lungs every 6 (six) hours as needed for wheezing or shortness of breath.    . fluticasone (FLONASE) 50 MCG/ACT nasal spray Place 2 sprays into both nostrils daily. 16 g 6  . clonazePAM (KLONOPIN) 0.25 MG disintegrating tablet Take one tab by mouth one to three times daily as needed for anxiety (Patient taking differently: Take 0.25 mg by mouth 3 (three) times daily as needed (anxiety). ) 10 tablet 1  . sertraline (ZOLOFT) 100 MG tablet Take 1.5 tablets (150 mg total) by mouth daily. (Patient taking differently: Take 100 mg by mouth daily. ) 135 tablet 2  . HYDROcodone-acetaminophen (NORCO/VICODIN) 5-325 MG tablet Take 1 tablet by mouth every 4 (four) hours as needed.  12 tablet 0  . levonorgestrel (MIRENA, 52 MG,) 20 MCG/24HR IUD 20 mcg by Intrauterine route once.    . ondansetron (ZOFRAN ODT) 4 MG disintegrating tablet Take 1 tablet (4 mg total) by mouth every 8 (eight) hours as needed for nausea. 10 tablet 0   No facility-administered medications prior to visit.    No Known Allergies  ROS Review of Systems    Objective:    Physical Exam Constitutional:      Appearance: She is well-developed.  HENT:     Head: Normocephalic and atraumatic.  Cardiovascular:     Rate and Rhythm: Normal rate and regular rhythm.     Heart sounds: Normal heart sounds.  Pulmonary:     Effort: Pulmonary effort is normal.     Breath sounds: Normal breath sounds.  Skin:    General: Skin is warm and dry.  Neurological:     Mental Status: She is alert and oriented to person, place, and time.  Psychiatric:        Behavior: Behavior normal.     BP 128/62   Pulse 71   Ht 5' 5"  (1.651 m)   Wt 169 lb (76.7 kg)   SpO2 100%   BMI 28.12 kg/m  Wt Readings from Last 3 Encounters:  07/11/20 169 lb (76.7 kg)  02/20/20 176 lb (79.8 kg)  02/14/20 175 lb (79.4 kg)     Health Maintenance Due  Topic Date Due  . Hepatitis C Screening  Never done    There are no preventive care reminders to display for this patient.  Lab Results  Component Value Date   TSH 2.41 08/18/2016   Lab Results  Component Value Date   WBC 16.6 (H) 02/14/2020   HGB 13.4 02/14/2020   HCT 39.2 02/14/2020   MCV 91.4 02/14/2020   PLT 156 02/14/2020   Lab Results  Component Value Date   NA 139 02/14/2020   K 3.2 (L) 02/14/2020   CO2 22 02/14/2020   GLUCOSE 205 (H) 02/14/2020   BUN 15 02/14/2020   CREATININE 0.74 02/14/2020   BILITOT 1.0 02/14/2020   ALKPHOS 52 02/14/2020   AST 23 02/14/2020   ALT 21 02/14/2020   PROT 7.3 02/14/2020   ALBUMIN 4.4 02/14/2020   CALCIUM 8.9 02/14/2020   ANIONGAP 12 02/14/2020   Lab Results  Component Value Date   CHOL 116 (L) 08/18/2016    Lab Results  Component Value Date   HDL 47 08/18/2016   Lab Results  Component Value Date   LDLCALC 59 08/18/2016  Lab Results  Component Value Date   TRIG 51 08/18/2016   Lab Results  Component Value Date   CHOLHDL 2.5 08/18/2016   Lab Results  Component Value Date   HGBA1C 5.3 02/12/2017      Assessment & Plan:   Problem List Items Addressed This Visit      Respiratory   Asthma    No recent flares or exacerbations.  She really has not even had to use her nasal steroid spray for allergies.        Other   Menses, irregular    Planning on getting back in with her GYN to possibly have the IUD removed and get back on birth control.      ANXIETY DISORDER, GENERALIZED - Primary    Continue current regimen with sertraline 100 mg daily and as needed clonazepam which she uses very sparingly.  Refills are sent to pharmacy.  Follow-up 6 months.  PHQ-9 score 5 today and GAD-7 score of 8.      Relevant Medications   clonazePAM (KLONOPIN) 0.25 MG disintegrating tablet   sertraline (ZOLOFT) 100 MG tablet    Other Visit Diagnoses    Need for immunization against influenza       Relevant Orders   Flu Vaccine QUAD 36+ mos IM (Completed)   Screening, lipid       Relevant Orders   COMPLETE METABOLIC PANEL WITH GFR   Lipid panel   CBC   Hypokalemia       Relevant Orders   COMPLETE METABOLIC PANEL WITH GFR      Meds ordered this encounter  Medications  . clonazePAM (KLONOPIN) 0.25 MG disintegrating tablet    Sig: Take 1 tablet (0.25 mg total) by mouth daily as needed.    Dispense:  10 tablet    Refill:  1  . sertraline (ZOLOFT) 100 MG tablet    Sig: Take 1 tablet (100 mg total) by mouth daily.    Dispense:  90 tablet    Refill:  1    Follow-up: Return in about 6 months (around 01/11/2021) for Mood medication.   Spent 21 minutes in encounter.  Beatrice Lecher, MD

## 2020-07-11 NOTE — Assessment & Plan Note (Signed)
No recent flares or exacerbations.  She really has not even had to use her nasal steroid spray for allergies.

## 2020-07-11 NOTE — Assessment & Plan Note (Signed)
Planning on getting back in with her GYN to possibly have the IUD removed and get back on birth control.

## 2020-09-21 ENCOUNTER — Ambulatory Visit: Payer: No Typology Code available for payment source

## 2020-09-24 ENCOUNTER — Encounter: Payer: Self-pay | Admitting: Family Medicine

## 2020-09-24 DIAGNOSIS — H9193 Unspecified hearing loss, bilateral: Secondary | ICD-10-CM

## 2020-09-24 DIAGNOSIS — C4491 Basal cell carcinoma of skin, unspecified: Secondary | ICD-10-CM

## 2020-10-02 ENCOUNTER — Other Ambulatory Visit (HOSPITAL_COMMUNITY): Payer: Self-pay | Admitting: Dermatology

## 2020-10-02 MED FILL — FLUOROURACIL 5 % CREA: 5 | 15 days supply | Qty: 40 | Fill #0

## 2020-10-14 MED FILL — SERTRALINE HCL 100 MG TABS: 100 | 90 days supply | Qty: 90 | Fill #1

## 2021-01-09 ENCOUNTER — Ambulatory Visit: Payer: No Typology Code available for payment source | Admitting: Family Medicine

## 2021-02-03 ENCOUNTER — Other Ambulatory Visit: Payer: Self-pay | Admitting: Family Medicine

## 2021-02-04 ENCOUNTER — Other Ambulatory Visit: Payer: Self-pay | Admitting: Family Medicine

## 2021-02-05 ENCOUNTER — Other Ambulatory Visit (HOSPITAL_BASED_OUTPATIENT_CLINIC_OR_DEPARTMENT_OTHER): Payer: Self-pay

## 2021-02-05 MED ORDER — FLUTICASONE PROPIONATE 50 MCG/ACT NA SUSP
2.0000 | Freq: Every day | NASAL | 11 refills | Status: DC
  Filled 2021-02-05: qty 16, 30d supply, fill #0

## 2021-02-12 ENCOUNTER — Other Ambulatory Visit (HOSPITAL_BASED_OUTPATIENT_CLINIC_OR_DEPARTMENT_OTHER): Payer: Self-pay

## 2021-02-14 ENCOUNTER — Other Ambulatory Visit: Payer: Self-pay | Admitting: *Deleted

## 2021-02-14 ENCOUNTER — Other Ambulatory Visit: Payer: Self-pay | Admitting: Family Medicine

## 2021-02-14 MED ORDER — SERTRALINE HCL 100 MG PO TABS: 100.0000 mg | ORAL_TABLET | Freq: Every day | ORAL | 1 refills | Status: DC

## 2021-02-14 NOTE — Telephone Encounter (Signed)
Please call pt and have her to schedule a f/u appt for cpe and medication refill

## 2021-02-17 NOTE — Telephone Encounter (Signed)
Call pt to make an appt., there was no answer and I lVM for her to call and schedule and appt. I will continue to call pt. tvt

## 2021-03-18 ENCOUNTER — Other Ambulatory Visit: Payer: Self-pay

## 2021-03-18 ENCOUNTER — Other Ambulatory Visit (HOSPITAL_COMMUNITY): Payer: Self-pay

## 2021-03-18 ENCOUNTER — Ambulatory Visit (INDEPENDENT_AMBULATORY_CARE_PROVIDER_SITE_OTHER): Payer: No Typology Code available for payment source | Admitting: Family Medicine

## 2021-03-18 ENCOUNTER — Encounter: Payer: Self-pay | Admitting: Family Medicine

## 2021-03-18 VITALS — BP 126/69 | HR 86 | Ht 65.0 in | Wt 172.0 lb

## 2021-03-18 DIAGNOSIS — J4520 Mild intermittent asthma, uncomplicated: Secondary | ICD-10-CM

## 2021-03-18 DIAGNOSIS — H9193 Unspecified hearing loss, bilateral: Secondary | ICD-10-CM

## 2021-03-18 DIAGNOSIS — Z23 Encounter for immunization: Secondary | ICD-10-CM | POA: Diagnosis not present

## 2021-03-18 DIAGNOSIS — H6982 Other specified disorders of Eustachian tube, left ear: Secondary | ICD-10-CM

## 2021-03-18 DIAGNOSIS — F411 Generalized anxiety disorder: Secondary | ICD-10-CM | POA: Diagnosis not present

## 2021-03-18 MED ORDER — CLONAZEPAM 0.25 MG PO TBDP
0.2500 mg | ORAL_TABLET | Freq: Every day | ORAL | 0 refills | Status: DC | PRN
  Filled 2021-03-18: qty 10, 10d supply, fill #0

## 2021-03-18 MED ORDER — PREDNISONE 20 MG PO TABS
40.0000 mg | ORAL_TABLET | Freq: Every day | ORAL | 0 refills | Status: DC
  Filled 2021-03-18: qty 10, 5d supply, fill #0

## 2021-03-18 NOTE — Assessment & Plan Note (Signed)
Discussed medication. Will continue current regimen.

## 2021-03-18 NOTE — Assessment & Plan Note (Signed)
Mild intermittent continue as needed use of albuterol.  Currently stable.

## 2021-03-18 NOTE — Progress Notes (Signed)
Established Patient Office Visit  Subjective:  Patient ID: Sue [name], female    DOB: 06-02-1971  Age: 50 y.o. MRN: [id number]  CC:  Chief Complaint  Patient presents with  . Medication Refill    HPI Sue [name] presents for   F/U Anxiety - Currently on Zoloft and prn clonazepam.  She is doing well overall on her current regimen there are just very specific situations that seem to be a little bit more of a trigger she is currently on 100 mg of sertraline daily and tolerating it well.  Sinus sxs x 1 weeks.  Neg COVID x 2.  No fever or chills. Using Sudafed.  Her left ear is also stopped up she has been taking decongestants, Claritin, and Flonase without significant relief.  She said it might unplug for just a few seconds or temporarily and then it comes right back.  She did have her yearly hearing test and it has been stable no significant changes which is great.  Follow-up asthma-doing really well not currently on a controller.  Just uses Ventolin as needed.  She really has not had any problems thus far with the spring weather.  Past Medical History:  Diagnosis Date  . Anxiety   . Asthma   . Basal cell carcinoma of skin of face 2007   Dr. Delman Cheadle    Past Surgical History:  Procedure Laterality Date  . MOHS SURGERY     removal of basal cell carcinoma    Family History  Problem Relation Age of Onset  . Hypertension Mother   . Hyperlipidemia Father   . Hypertension Father   . Diabetes Maternal Grandfather   . Depression Sister     Social History   Socioeconomic History  . Marital status: Married    Spouse name: Not on file  . Number of children: Not on file  . Years of education: Not on file  . Highest education level: Not on file  Occupational History  . Occupation: [employer].     Comment: Advertising copywriter   Tobacco Use  . Smoking status: Never Smoker  . Smokeless tobacco: Never Used  Vaping Use  . Vaping Use: Never used  Substance and  Sexual Activity  . Alcohol use: Yes    Comment: couple drinks per week  . Drug use: No  . Sexual activity: Not on file  Other Topics Concern  . Not on file  Social History Narrative  . Not on file   Social Determinants of Health   Financial Resource Strain: Not on file  Food Insecurity: Not on file  Transportation Needs: Not on file  Physical Activity: Not on file  Stress: Not on file  Social Connections: Not on file  Intimate Partner Violence: Not on file    Outpatient Medications Prior to Visit  Medication Sig Dispense Refill  . albuterol (VENTOLIN HFA) 108 (90 Base) MCG/ACT inhaler Inhale 1-2 puffs into the lungs every 6 (six) hours as needed for wheezing or shortness of breath.    . fluticasone (FLONASE) 50 MCG/ACT nasal spray Place 2 sprays into both nostrils daily. 16 g 11  . sertraline (ZOLOFT) 100 MG tablet TAKE 1 TABLET (100 MG TOTAL) BY MOUTH DAILY. 90 tablet 1  . clonazePAM (KLONOPIN) 0.25 MG disintegrating tablet Take 1 tablet (0.25 mg total) by mouth daily as needed. 10 tablet 1  . fluorouracil (EFUDEX) 5 % cream APPLY TO THE AFFECTED AREA(S) ONCE A DAY FOR 15 DAYS 40 g 0  .  fluticasone (FLONASE) 50 MCG/ACT nasal spray Place 2 sprays into both nostrils daily. 16 g 6   No facility-administered medications prior to visit.    No Known Allergies  ROS Review of Systems    Objective:    Physical Exam Constitutional:      Appearance: She is well-developed.  HENT:     Head: Normocephalic and atraumatic.  Cardiovascular:     Rate and Rhythm: Normal rate and regular rhythm.     Heart sounds: Normal heart sounds.  Pulmonary:     Effort: Pulmonary effort is normal.     Breath sounds: Normal breath sounds.  Skin:    General: Skin is warm and dry.  Neurological:     Mental Status: She is alert and oriented to person, place, and time.  Psychiatric:        Behavior: Behavior normal.     BP 126/69   Pulse 86   Ht 5' 5"  (1.651 m)   Wt 172 lb (78 kg)   SpO2  98%   BMI 28.62 kg/m  Wt Readings from Last 3 Encounters:  03/18/21 172 lb (78 kg)  07/11/20 169 lb (76.7 kg)  02/20/20 176 lb (79.8 kg)     Health Maintenance Due  Topic Date Due  . Hepatitis C Screening  Never done  . COLONOSCOPY (Pts 45-55yr Insurance coverage will need to be confirmed)  Never done  . COVID-19 Vaccine (3 - Pfizer risk 4-dose series) 02/02/2020    There are no preventive care reminders to display for this patient.  Lab Results  Component Value Date   TSH 2.41 08/18/2016   Lab Results  Component Value Date   WBC 16.6 (H) 02/14/2020   HGB 13.4 02/14/2020   HCT 39.2 02/14/2020   MCV 91.4 02/14/2020   PLT 156 02/14/2020   Lab Results  Component Value Date   NA 139 02/14/2020   K 3.2 (L) 02/14/2020   CO2 22 02/14/2020   GLUCOSE 205 (H) 02/14/2020   BUN 15 02/14/2020   CREATININE 0.74 02/14/2020   BILITOT 1.0 02/14/2020   ALKPHOS 52 02/14/2020   AST 23 02/14/2020   ALT 21 02/14/2020   PROT 7.3 02/14/2020   ALBUMIN 4.4 02/14/2020   CALCIUM 8.9 02/14/2020   ANIONGAP 12 02/14/2020   Lab Results  Component Value Date   CHOL 116 (L) 08/18/2016   Lab Results  Component Value Date   HDL 47 08/18/2016   Lab Results  Component Value Date   LDLCALC 59 08/18/2016   Lab Results  Component Value Date   TRIG 51 08/18/2016   Lab Results  Component Value Date   CHOLHDL 2.5 08/18/2016   Lab Results  Component Value Date   HGBA1C 5.3 02/12/2017      Assessment & Plan:   Problem List Items Addressed This Visit      Respiratory   Asthma - Primary    Mild intermittent continue as needed use of albuterol.  Currently stable.      Relevant Medications   predniSONE (DELTASONE) 20 MG tablet     Nervous and Auditory   Bilateral hearing loss    Followed yearly with hearing screens.  Stable.        Other   ANXIETY DISORDER, GENERALIZED    Discussed medication. Will continue current regimen.        Relevant Medications   clonazePAM  (KLONOPIN) 0.25 MG disintegrating tablet    Other Visit Diagnoses    Need for tetanus,  diphtheria, and acellular pertussis (Tdap) vaccine in patient of adolescent age or older       Relevant Orders   Tdap vaccine greater than or equal to 7yo IM (Completed)   Dysfunction of left eustachian tube         Tdap given today.      Dysfunction of the left eustachian tube.  - will treat with prednisone.  Call if not better after the weekend.    Meds ordered this encounter  Medications  . clonazePAM (KLONOPIN) 0.25 MG disintegrating tablet    Sig: Take 1 tablet (0.25 mg total) by mouth daily as needed.    Dispense:  10 tablet    Refill:  0    Pt will call when needed  . predniSONE (DELTASONE) 20 MG tablet    Sig: Take 2 tablets (40 mg total) by mouth daily with breakfast.    Dispense:  10 tablet    Refill:  0    Follow-up: Return in about 6 months (around 09/17/2021) for Mood .    Beatrice Lecher, MD

## 2021-03-18 NOTE — Assessment & Plan Note (Signed)
Followed yearly with hearing screens.  Stable.

## 2021-04-03 ENCOUNTER — Ambulatory Visit: Payer: No Typology Code available for payment source | Admitting: Family Medicine

## 2021-04-07 ENCOUNTER — Other Ambulatory Visit (HOSPITAL_BASED_OUTPATIENT_CLINIC_OR_DEPARTMENT_OTHER): Payer: Self-pay

## 2021-04-07 MED ORDER — CARESTART COVID-19 HOME TEST VI KIT
PACK | 0 refills | Status: DC
  Filled 2021-04-07: qty 4, 8d supply, fill #0

## 2021-05-13 ENCOUNTER — Other Ambulatory Visit (HOSPITAL_BASED_OUTPATIENT_CLINIC_OR_DEPARTMENT_OTHER): Payer: Self-pay

## 2021-05-13 MED FILL — Sertraline HCl Tab 100 MG: ORAL | 90 days supply | Qty: 90 | Fill #0 | Status: AC

## 2021-05-14 ENCOUNTER — Other Ambulatory Visit (HOSPITAL_BASED_OUTPATIENT_CLINIC_OR_DEPARTMENT_OTHER): Payer: Self-pay

## 2021-06-13 ENCOUNTER — Other Ambulatory Visit (HOSPITAL_COMMUNITY): Payer: Self-pay

## 2021-06-13 ENCOUNTER — Telehealth: Payer: Self-pay | Admitting: *Deleted

## 2021-06-13 MED ORDER — ALBUTEROL SULFATE HFA 108 (90 BASE) MCG/ACT IN AERS
1.0000 | INHALATION_SPRAY | Freq: Four times a day (QID) | RESPIRATORY_TRACT | 99 refills | Status: DC | PRN
  Filled 2021-06-13: qty 18, 25d supply, fill #0

## 2021-06-13 NOTE — Telephone Encounter (Signed)
Need more info. I don't see a documented visit for the last 3 mo

## 2021-06-13 NOTE — Telephone Encounter (Signed)
Pt called and stated that she still has cough and congestion. She tested positive a week ago and asked if there was something else that she should be doing to help with her lingering sxs since she has finished the abx and steroid.

## 2021-06-16 NOTE — Telephone Encounter (Signed)
LVM asking that she either return call or send a my chart about her sxs.

## 2021-06-17 ENCOUNTER — Other Ambulatory Visit (HOSPITAL_COMMUNITY): Payer: Self-pay

## 2021-06-17 MED ORDER — AZITHROMYCIN 250 MG PO TABS
ORAL_TABLET | ORAL | 0 refills | Status: AC
  Filled 2021-06-17: qty 6, 5d supply, fill #0

## 2021-06-17 NOTE — Telephone Encounter (Signed)
LVM advising pt of recommendations. Asked that she call back if not better by end of the week as directed.

## 2021-06-17 NOTE — Telephone Encounter (Signed)
Sounds like she could have a secondary sinus infection we will go ahead and send in azithromycin.  If she is not better by the end of the week then we will have her schedule an appointment.  Meds ordered this encounter  Medications   albuterol (VENTOLIN HFA) 108 (90 Base) MCG/ACT inhaler    Sig: Inhale 1-2 puffs into the lungs every 6 hours as needed for wheezing or shortness of breath.    Dispense:  18 g    Refill:  PRN   azithromycin (ZITHROMAX) 250 MG tablet    Sig: 2 Ttabs PO on Day 1, then one a day x 4 days.    Dispense:  6 tablet    Refill:  0

## 2021-06-17 NOTE — Telephone Encounter (Signed)
Pt reports that she did an at home test and was told by Health at work to take mucinex, and decongestant  Pt states that she is doing pretty well now. She continues to have a lingering cough that has been ongoing x 2 wks. She stated that her ear also gets stopped up and was previously given steroids for this and felt that if she continued to take the decongestant that this would help with the ear issue.   She has been cleared to return to work however, she mostly works from home.  She would like to f/u with pcp only however no appointments are available only acute. She asked if she can get in Thursday. Pt informed that Thursday we would need to call her due to only having acute slots open in the afternoon and that I could not scheduled her at this time for that slot.   Will fwd to pcp

## 2021-08-13 LAB — HM PAP SMEAR: HM Pap smear: NEGATIVE

## 2021-08-13 LAB — RESULTS CONSOLE HPV: CHL HPV: NEGATIVE

## 2021-08-27 ENCOUNTER — Other Ambulatory Visit (HOSPITAL_BASED_OUTPATIENT_CLINIC_OR_DEPARTMENT_OTHER): Payer: Self-pay

## 2021-08-27 MED ORDER — COVID-19 AT HOME ANTIGEN TEST VI KIT
PACK | 0 refills | Status: DC
  Filled 2021-08-27: qty 2, 4d supply, fill #0

## 2021-08-28 ENCOUNTER — Other Ambulatory Visit (HOSPITAL_BASED_OUTPATIENT_CLINIC_OR_DEPARTMENT_OTHER): Payer: Self-pay

## 2021-09-17 ENCOUNTER — Encounter: Payer: Self-pay | Admitting: Family Medicine

## 2021-09-17 ENCOUNTER — Ambulatory Visit (INDEPENDENT_AMBULATORY_CARE_PROVIDER_SITE_OTHER): Payer: No Typology Code available for payment source | Admitting: Family Medicine

## 2021-09-17 ENCOUNTER — Other Ambulatory Visit: Payer: Self-pay

## 2021-09-17 ENCOUNTER — Other Ambulatory Visit (HOSPITAL_BASED_OUTPATIENT_CLINIC_OR_DEPARTMENT_OTHER): Payer: Self-pay

## 2021-09-17 VITALS — BP 129/68 | HR 70 | Ht 65.0 in | Wt 177.0 lb

## 2021-09-17 DIAGNOSIS — J4520 Mild intermittent asthma, uncomplicated: Secondary | ICD-10-CM

## 2021-09-17 DIAGNOSIS — F411 Generalized anxiety disorder: Secondary | ICD-10-CM | POA: Diagnosis not present

## 2021-09-17 DIAGNOSIS — Z13220 Encounter for screening for lipoid disorders: Secondary | ICD-10-CM | POA: Diagnosis not present

## 2021-09-17 DIAGNOSIS — Z23 Encounter for immunization: Secondary | ICD-10-CM | POA: Diagnosis not present

## 2021-09-17 DIAGNOSIS — Z1211 Encounter for screening for malignant neoplasm of colon: Secondary | ICD-10-CM

## 2021-09-17 DIAGNOSIS — Z1159 Encounter for screening for other viral diseases: Secondary | ICD-10-CM

## 2021-09-17 DIAGNOSIS — H938X2 Other specified disorders of left ear: Secondary | ICD-10-CM

## 2021-09-17 MED ORDER — FLUTICASONE PROPIONATE 50 MCG/ACT NA SUSP
2.0000 | Freq: Every day | NASAL | 11 refills | Status: DC
  Filled 2021-09-17: qty 16, 30d supply, fill #0

## 2021-09-17 MED ORDER — CLONAZEPAM 0.25 MG PO TBDP
0.2500 mg | ORAL_TABLET | Freq: Every day | ORAL | 0 refills | Status: DC | PRN
Start: 2021-09-17 — End: 2022-05-04
  Filled 2021-09-17: qty 10, 10d supply, fill #0

## 2021-09-17 MED ORDER — PREDNISONE 20 MG PO TABS
40.0000 mg | ORAL_TABLET | Freq: Every day | ORAL | 0 refills | Status: DC
  Filled 2021-09-17: qty 10, 5d supply, fill #0

## 2021-09-17 MED ORDER — SERTRALINE HCL 100 MG PO TABS
ORAL_TABLET | Freq: Every day | ORAL | 1 refills | Status: DC
  Filled 2021-09-17: qty 90, 90d supply, fill #0
  Filled 2021-12-31: qty 90, 90d supply, fill #1

## 2021-09-17 NOTE — Assessment & Plan Note (Signed)
Mild intermittent.  No changes.

## 2021-09-17 NOTE — Progress Notes (Signed)
Established Patient Office Visit  Subjective:  Patient ID: Sue [name], female    DOB: 07-12-1971  Age: 50 y.o. MRN: [id number]  CC:  Chief Complaint  Patient presents with   Anxiety    HPI Sue [name] presents for 6 mo f/u for mood.  She is currently on Zoloft 100 mg daily and doing well with it.  She does rarely use clonazepam 0.25 mg.  We had sent in a prescription for 10 tabs back in April.  F/U Asthma -since then been doing really well overall.  The only time she really uses her rescue inhaler is when she gets a cold.  She did actually have a cold a couple of weeks ago and then had to fly last week she says every time that happens she gets a lot of pressure and fullness in her left ear it feels like it feels up and then will pop and then fills up again.  She has been using her Flonase regularly to try to get it to release.  Sometimes that works but sometimes it does not.  Past Medical History:  Diagnosis Date   Anxiety    Asthma    Basal cell carcinoma of skin of face 2007   Dr. Delman Cheadle    Past Surgical History:  Procedure Laterality Date   MOHS SURGERY     removal of basal cell carcinoma    Family History  Problem Relation Age of Onset   Hypertension Mother    Hyperlipidemia Father    Hypertension Father    Diabetes Maternal Grandfather    Depression Sister     Social History   Socioeconomic History   Marital status: Married    Spouse name: Not on file   Number of children: Not on file   Years of education: Not on file   Highest education level: Not on file  Occupational History   Occupation: [employer].     Comment: Advertising copywriter   Tobacco Use   Smoking status: Never   Smokeless tobacco: Never  Vaping Use   Vaping Use: Never used  Substance and Sexual Activity   Alcohol use: Yes    Comment: couple drinks per week   Drug use: No   Sexual activity: Not on file  Other Topics Concern   Not on file  Social History Narrative   Not on  file   Social Determinants of Health   Financial Resource Strain: Not on file  Food Insecurity: Not on file  Transportation Needs: Not on file  Physical Activity: Not on file  Stress: Not on file  Social Connections: Not on file  Intimate Partner Violence: Not on file    Outpatient Medications Prior to Visit  Medication Sig Dispense Refill   albuterol (VENTOLIN HFA) 108 (90 Base) MCG/ACT inhaler Inhale 1-2 puffs into the lungs every 6 hours as needed for wheezing or shortness of breath. 18 g PRN   clonazePAM (KLONOPIN) 0.25 MG disintegrating tablet Take 1 tablet (0.25 mg total) by mouth daily as needed. 10 tablet 0   fluticasone (FLONASE) 50 MCG/ACT nasal spray Place 2 sprays into both nostrils daily. 16 g 11   COVID-19 At Home Antigen Test KIT use as directed. 2 kit 0   sertraline (ZOLOFT) 100 MG tablet TAKE 1 TABLET (100 MG TOTAL) BY MOUTH DAILY. 90 tablet 1   No facility-administered medications prior to visit.    No Known Allergies  ROS Review of Systems    Objective:  Physical Exam Constitutional:      Appearance: She is well-developed.  HENT:     Head: Normocephalic and atraumatic.     Comments: Reflexes distorted over the left tympanic membrane.    Right Ear: Tympanic membrane, ear canal and external ear normal.     Left Ear: Tympanic membrane, ear canal and external ear normal.     Nose: Nose normal.     Mouth/Throat:     Mouth: Mucous membranes are moist.     Pharynx: Oropharynx is clear. No oropharyngeal exudate or posterior oropharyngeal erythema.  Eyes:     Conjunctiva/sclera: Conjunctivae normal.     Pupils: Pupils are equal, round, and reactive to light.  Neck:     Thyroid: No thyromegaly.     Comments: Is a palpable left-sided mid cervical lymph node. Cardiovascular:     Rate and Rhythm: Normal rate and regular rhythm.     Heart sounds: Normal heart sounds.  Pulmonary:     Effort: Pulmonary effort is normal.     Breath sounds: Normal breath  sounds. No wheezing.  Musculoskeletal:     Cervical back: Neck supple.  Lymphadenopathy:     Cervical: Cervical adenopathy present.  Skin:    General: Skin is warm and dry.  Neurological:     Mental Status: She is alert and oriented to person, place, and time.    BP 129/68   Pulse 70   Ht 5' 5" (1.651 m)   Wt 177 lb (80.3 kg)   SpO2 99%   BMI 29.45 kg/m  Wt Readings from Last 3 Encounters:  09/17/21 177 lb (80.3 kg)  03/18/21 172 lb (78 kg)  07/11/20 169 lb (76.7 kg)     Health Maintenance Due  Topic Date Due   Pneumococcal Vaccine 72-59 Years old (1 - PCV) Never done   Hepatitis C Screening  Never done   COLONOSCOPY (Pts 45-37yr Insurance coverage will need to be confirmed)  Never done   COVID-19 Vaccine (3 - Pfizer risk series) 02/02/2020   PAP SMEAR-Modifier  08/17/2021    There are no preventive care reminders to display for this patient.  Lab Results  Component Value Date   TSH 2.41 08/18/2016   Lab Results  Component Value Date   WBC 16.6 (H) 02/14/2020   HGB 13.4 02/14/2020   HCT 39.2 02/14/2020   MCV 91.4 02/14/2020   PLT 156 02/14/2020   Lab Results  Component Value Date   NA 139 02/14/2020   K 3.2 (L) 02/14/2020   CO2 22 02/14/2020   GLUCOSE 205 (H) 02/14/2020   BUN 15 02/14/2020   CREATININE 0.74 02/14/2020   BILITOT 1.0 02/14/2020   ALKPHOS 52 02/14/2020   AST 23 02/14/2020   ALT 21 02/14/2020   PROT 7.3 02/14/2020   ALBUMIN 4.4 02/14/2020   CALCIUM 8.9 02/14/2020   ANIONGAP 12 02/14/2020   Lab Results  Component Value Date   CHOL 116 (L) 08/18/2016   Lab Results  Component Value Date   HDL 47 08/18/2016   Lab Results  Component Value Date   LDLCALC 59 08/18/2016   Lab Results  Component Value Date   TRIG 51 08/18/2016   Lab Results  Component Value Date   CHOLHDL 2.5 08/18/2016   Lab Results  Component Value Date   HGBA1C 5.3 02/12/2017      Assessment & Plan:   Problem List Items Addressed This Visit        Respiratory   Asthma  Mild intermittent.  No changes.      Relevant Medications   predniSONE (DELTASONE) 20 MG tablet   Other Relevant Orders   COMPLETE METABOLIC PANEL WITH GFR   CBC     Other   ANXIETY DISORDER, GENERALIZED - Primary    Continue current regimen.  No changes.  Follow-up in 6 months.      Relevant Medications   sertraline (ZOLOFT) 100 MG tablet   clonazePAM (KLONOPIN) 0.25 MG disintegrating tablet   Other Visit Diagnoses     Encounter for hepatitis C screening test for low risk patient       Relevant Orders   Hepatitis C Antibody   Screening, lipid       Relevant Orders   Lipid Panel w/reflex Direct LDL   Encounter for screening colonoscopy       Relevant Orders   Cologuard   Need for immunization against influenza       Relevant Orders   Flu Vaccine QUAD 69moIM (Fluarix, Fluzone & Alfiuria Quad PF) (Completed)   Ear fullness, left       Relevant Medications   predniSONE (DELTASONE) 20 MG tablet       Ear fullness/eustachian tube dysfunction status post upper respiratory infection-recommend prednisone at this point since it has not been improved with 1 week of the fluticasone.  Could consider adding a decongestant with an antihistamine as well.  Discussed need for colon cancer screening..Marland Kitchen Options reviewed.  She would prefer Cologuard at this point.  Meds ordered this encounter  Medications   sertraline (ZOLOFT) 100 MG tablet    Sig: TAKE 1 TABLET (100 MG TOTAL) BY MOUTH DAILY.    Dispense:  90 tablet    Refill:  1   clonazePAM (KLONOPIN) 0.25 MG disintegrating tablet    Sig: Take 1 tablet (0.25 mg total) by mouth daily as needed.    Dispense:  10 tablet    Refill:  0    Pt will call when needed   fluticasone (FLONASE) 50 MCG/ACT nasal spray    Sig: Place 2 sprays into both nostrils daily.    Dispense:  16 g    Refill:  11   predniSONE (DELTASONE) 20 MG tablet    Sig: Take 2 tablets (40 mg total) by mouth daily with breakfast.     Dispense:  10 tablet    Refill:  0    Follow-up: Return in about 6 months (around 03/18/2022) for Mood medication.    CBeatrice Lecher MD

## 2021-09-17 NOTE — Assessment & Plan Note (Signed)
Continue current regimen.  No changes.  Follow-up in 6 months.

## 2021-09-25 ENCOUNTER — Encounter: Payer: Self-pay | Admitting: Family Medicine

## 2021-09-25 MED ORDER — AMOXICILLIN-POT CLAVULANATE 875-125 MG PO TABS
1.0000 | ORAL_TABLET | Freq: Two times a day (BID) | ORAL | 0 refills | Status: DC
  Filled 2021-09-25: qty 14, 7d supply, fill #0

## 2021-09-26 ENCOUNTER — Other Ambulatory Visit (HOSPITAL_BASED_OUTPATIENT_CLINIC_OR_DEPARTMENT_OTHER): Payer: Self-pay

## 2021-09-26 ENCOUNTER — Other Ambulatory Visit (HOSPITAL_COMMUNITY): Payer: Self-pay

## 2021-11-26 DIAGNOSIS — Z30431 Encounter for routine checking of intrauterine contraceptive device: Secondary | ICD-10-CM | POA: Diagnosis not present

## 2021-12-13 ENCOUNTER — Other Ambulatory Visit (HOSPITAL_COMMUNITY): Payer: Self-pay

## 2021-12-13 MED ORDER — CARESTART COVID-19 HOME TEST VI KIT
PACK | 0 refills | Status: DC
  Filled 2021-12-13: qty 2, 4d supply, fill #0

## 2021-12-28 DIAGNOSIS — Z1211 Encounter for screening for malignant neoplasm of colon: Secondary | ICD-10-CM | POA: Diagnosis not present

## 2021-12-31 ENCOUNTER — Other Ambulatory Visit (HOSPITAL_BASED_OUTPATIENT_CLINIC_OR_DEPARTMENT_OTHER): Payer: Self-pay

## 2022-01-05 LAB — COLOGUARD: COLOGUARD: NEGATIVE

## 2022-01-06 NOTE — Progress Notes (Signed)
Great news! Your Cologuard test is negative.  Recommend repeat colon cancer screening in 3 years.

## 2022-01-19 ENCOUNTER — Other Ambulatory Visit: Payer: Self-pay | Admitting: Family Medicine

## 2022-01-19 DIAGNOSIS — Z1231 Encounter for screening mammogram for malignant neoplasm of breast: Secondary | ICD-10-CM

## 2022-01-28 ENCOUNTER — Ambulatory Visit
Admission: RE | Admit: 2022-01-28 | Discharge: 2022-01-28 | Disposition: A | Discharge status: Home / Self Care | Payer: 59 | Source: Ambulatory Visit | Attending: Family Medicine | Admitting: Family Medicine

## 2022-01-28 ENCOUNTER — Other Ambulatory Visit: Payer: Self-pay

## 2022-01-28 ENCOUNTER — Encounter: Payer: Self-pay | Admitting: Family Medicine

## 2022-01-28 DIAGNOSIS — Z1231 Encounter for screening mammogram for malignant neoplasm of breast: Secondary | ICD-10-CM | POA: Diagnosis not present

## 2022-01-28 NOTE — Telephone Encounter (Signed)
Form completed and placed in [place] B box ?

## 2022-01-29 NOTE — Progress Notes (Signed)
Please call patient. Normal mammogram.  Repeat in 1 year.  ?

## 2022-02-23 DIAGNOSIS — H524 Presbyopia: Secondary | ICD-10-CM | POA: Diagnosis not present

## 2022-03-18 ENCOUNTER — Ambulatory Visit: Payer: No Typology Code available for payment source | Admitting: Family Medicine

## 2022-04-02 ENCOUNTER — Other Ambulatory Visit (HOSPITAL_BASED_OUTPATIENT_CLINIC_OR_DEPARTMENT_OTHER): Payer: Self-pay

## 2022-04-02 ENCOUNTER — Other Ambulatory Visit: Payer: Self-pay | Admitting: Family Medicine

## 2022-04-02 DIAGNOSIS — F411 Generalized anxiety disorder: Secondary | ICD-10-CM

## 2022-04-02 MED ORDER — SERTRALINE HCL 100 MG PO TABS
ORAL_TABLET | Freq: Every day | ORAL | 1 refills | Status: DC
  Filled 2022-04-02: qty 90, 90d supply, fill #0
  Filled 2022-07-15: qty 90, 90d supply, fill #1

## 2022-04-22 ENCOUNTER — Encounter: Payer: Self-pay | Admitting: Family Medicine

## 2022-04-22 ENCOUNTER — Ambulatory Visit (INDEPENDENT_AMBULATORY_CARE_PROVIDER_SITE_OTHER): Payer: 59 | Admitting: Family Medicine

## 2022-04-22 VITALS — BP 148/76 | HR 86 | Ht 65.0 in | Wt 174.0 lb

## 2022-04-22 DIAGNOSIS — Z0000 Encounter for general adult medical examination without abnormal findings: Secondary | ICD-10-CM

## 2022-04-22 NOTE — Progress Notes (Signed)
Complete physical exam  Patient: Sue [name]   DOB: [birth year]   51 y.o. Female  MRN: [id number]  Subjective:    Chief Complaint  Patient presents with   Annual Exam    Sue [name] is a 51 y.o. female who presents today for a complete physical exam. She reports consuming a general diet. Home exercise routine includes walking and resistance training. She generally feels well. She reports sleeping well. She does not have additional problems to discuss today.  He is also participating in a research study at [facility] for patient to have family members diagnosed with dementia.  It is particularly focused on moderate exercise.  So she is exercising 3 days/week for the next year.  She just started the program.  They did do a brain MRI.  Mammogram is up-to-date. Also recently saw her dermatologist Dr. Girtha Rm.   Most recent fall risk assessment:    04/22/2022   10:14 AM  [place] in the past year? 0  Number falls in past yr: 0  Injury with Fall? 0  Risk for fall due to : No Fall Risks  Follow up Falls prevention discussed     Most recent depression screenings:    04/22/2022   10:27 AM 03/18/2021    6:01 PM  PHQ 2/9 Scores  PHQ - 2 Score 1 1  PHQ- 9 Score 4 5      Past Surgical History:  Procedure Laterality Date   MOHS SURGERY     removal of basal cell carcinoma   Social History   Tobacco Use   Smoking status: Never   Smokeless tobacco: Never  Vaping Use   Vaping Use: Never used  Substance Use Topics   Alcohol use: Yes    Comment: couple drinks per week   Drug use: No   Family History  Problem Relation Age of Onset   Hypertension Mother    Hyperlipidemia Father    Hypertension Father    Diabetes Maternal Grandfather    Depression Sister       Patient Care Team: Hali Marry, MD as PCP - Patsi Sears, MD as Consulting Physician (Dermatology)   Outpatient Medications Prior to Visit  Medication Sig   albuterol (VENTOLIN  HFA) 108 (90 Base) MCG/ACT inhaler Inhale 1-2 puffs into the lungs every 6 hours as needed for wheezing or shortness of breath.   clonazePAM (KLONOPIN) 0.25 MG disintegrating tablet Take 1 tablet (0.25 mg total) by mouth daily as needed.   fluticasone (FLONASE) 50 MCG/ACT nasal spray Place 2 sprays into both nostrils daily.   sertraline (ZOLOFT) 100 MG tablet TAKE 1 TABLET (100 MG TOTAL) BY MOUTH DAILY.   [DISCONTINUED] amoxicillin-clavulanate (AUGMENTIN) 875-125 MG tablet Take 1 tablet by mouth 2 times daily.   [DISCONTINUED] COVID-19 At Home Antigen Test (CARESTART COVID-19 HOME TEST) KIT Use as directed   [DISCONTINUED] predniSONE (DELTASONE) 20 MG tablet Take 2 tablets (40 mg total) by mouth daily with breakfast.   No facility-administered medications prior to visit.    ROS        Objective:     BP (!) 148/76   Pulse 86   Ht 5' 5"  (1.651 m)   Wt 174 lb (78.9 kg)   SpO2 96%   BMI 28.96 kg/m    Physical Exam Vitals reviewed.  Constitutional:      Appearance: She is well-developed.  HENT:     Head: Normocephalic and atraumatic.  Right Ear: External ear normal.     Left Ear: External ear normal.     Nose: Nose normal.  Eyes:     Conjunctiva/sclera: Conjunctivae normal.     Pupils: Pupils are equal, round, and reactive to light.  Neck:     Thyroid: No thyromegaly.  Cardiovascular:     Rate and Rhythm: Normal rate and regular rhythm.     Heart sounds: Normal heart sounds.  Pulmonary:     Effort: Pulmonary effort is normal.     Breath sounds: Normal breath sounds. No wheezing.  Abdominal:     General: Bowel sounds are normal. There is no distension.     Palpations: Abdomen is soft. There is no mass.  Musculoskeletal:     Cervical back: Neck supple.  Lymphadenopathy:     Cervical: No cervical adenopathy.  Skin:    General: Skin is warm and dry.     Coloration: Skin is not pale.  Neurological:     Mental Status: She is alert and oriented to person, place, and  time.  Psychiatric:        Behavior: Behavior normal.     Results for orders placed or performed in visit on 04/22/22  HM PAP SMEAR  Result Value Ref Range   HM Pap smear negative   Results Console HPV  Result Value Ref Range   CHL HPV Negative        Assessment & Plan:    Routine Health Maintenance and Physical Exam  Immunization History  Administered Date(s) Administered   Influenza Split 09/23/2012   Influenza, High Dose Seasonal PF 08/24/2015   Influenza,inj,Quad PF,6+ Mos 10/16/2013, 10/22/2014, 07/11/2020, 09/17/2021   Influenza-Unspecified 08/05/2016, 09/09/2018, 08/24/2019   PFIZER(Purple Top)SARS-COV-2 Vaccination 12/12/2019, 01/05/2020   Tdap 07/10/2011, 03/18/2021    Health Maintenance  Topic Date Due   Hepatitis C Screening  Never done   COVID-19 Vaccine (3 - Pfizer risk series) 02/02/2020   INFLUENZA VACCINE  06/23/2022   PAP SMEAR-Modifier  08/13/2024   Fecal DNA (Cologuard)  12/28/2024   TETANUS/TDAP  03/19/2031   HIV Screening  Completed   Pneumococcal Vaccine 55-71 Years old  Aged Out   HPV VACCINES  Aged Out    Discussed health benefits of physical activity, and encouraged her to engage in regular exercise appropriate for her age and condition.  Problem List Items Addressed This Visit   None Visit Diagnoses     Wellness examination    -  Primary   Relevant Orders   Lipid Panel w/reflex Direct LDL   COMPLETE METABOLIC PANEL WITH GFR   Hepatitis C Antibody   CBC      No follow-ups on file.   Keep up a regular exercise program and make sure you are eating a healthy diet Try to eat 4 servings of dairy a day, or if you are lactose intolerant take a calcium with vitamin D daily.  Your vaccines are up to date.  Mammo/pap UTD Due for labs.     Beatrice Lecher, MD

## 2022-04-22 NOTE — Patient Instructions (Signed)
Encouraged her to get a good arm blood pressure cuff.  Omron and lifestyle are good brands. Check your blood pressure maybe twice a week after you have been able to sit for about 5 minutes and relax. Can jot those pressures down in a log book and send them to me in about a month.

## 2022-04-23 LAB — LIPID PANEL W/REFLEX DIRECT LDL
Cholesterol: 132 mg/dL (ref <200)
HDL: 50 mg/dL (ref >=50)
LDL Cholesterol (Calc): 66 mg/dL (calc)
Non-HDL Cholesterol (Calc): 82 mg/dL (calc) (ref <130)
Total CHOL/HDL Ratio: 2.6 (calc) (ref <5.0)
Triglycerides: 79 mg/dL (ref <150)

## 2022-04-23 LAB — COMPLETE METABOLIC PANEL WITH GFR
AG Ratio: 1.7 (calc) (ref 1.0–2.5)
ALT: 16 U/L (ref 6–29)
AST: 15 U/L (ref 10–35)
Albumin: 4.4 g/dL (ref 3.6–5.1)
Alkaline phosphatase (APISO): 57 U/L (ref 31–125)
BUN: 13 mg/dL (ref 7–25)
CO2: 23 mmol/L (ref 20–32)
Calcium: 9.3 mg/dL (ref 8.6–10.2)
Chloride: 108 mmol/L (ref 98–110)
Creat: 0.69 mg/dL (ref 0.50–0.99)
Globulin: 2.6 g/dL (calc) (ref 1.9–3.7)
Glucose, Bld: 102 mg/dL (ref 65–139)
Potassium: 4 mmol/L (ref 3.5–5.3)
Sodium: 139 mmol/L (ref 135–146)
Total Bilirubin: 0.5 mg/dL (ref 0.2–1.2)
Total Protein: 7 g/dL (ref 6.1–8.1)
eGFR: 107 mL/min/{1.73_m2} (ref >=60)

## 2022-04-23 LAB — CBC
HCT: 41 % (ref 35.0–45.0)
Hemoglobin: 14.4 g/dL (ref 11.7–15.5)
MCH: 31.7 pg (ref 27.0–33.0)
MCHC: 35.1 g/dL (ref 32.0–36.0)
MCV: 90.3 fL (ref 80.0–100.0)
MPV: 11.6 fL (ref 7.5–12.5)
Platelets: 200 10*3/uL (ref 140–400)
RBC: 4.54 10*6/uL (ref 3.80–5.10)
RDW: 11.8 % (ref 11.0–15.0)
WBC: 6.8 10*3/uL (ref 3.8–10.8)

## 2022-04-23 LAB — HEPATITIS C ANTIBODY
Hepatitis C Ab: NONREACTIVE
SIGNAL TO CUT-OFF: 0.08 (ref <1.00)

## 2022-04-23 NOTE — Progress Notes (Signed)
Hi Sue [name], all your labs look great.  Negative for hepatitis C.

## 2022-04-29 ENCOUNTER — Other Ambulatory Visit (HOSPITAL_BASED_OUTPATIENT_CLINIC_OR_DEPARTMENT_OTHER): Payer: Self-pay

## 2022-05-04 ENCOUNTER — Other Ambulatory Visit: Payer: Self-pay | Admitting: Family Medicine

## 2022-05-04 ENCOUNTER — Encounter: Payer: Self-pay | Admitting: Family Medicine

## 2022-05-04 DIAGNOSIS — F411 Generalized anxiety disorder: Secondary | ICD-10-CM

## 2022-05-05 ENCOUNTER — Other Ambulatory Visit (HOSPITAL_BASED_OUTPATIENT_CLINIC_OR_DEPARTMENT_OTHER): Payer: Self-pay

## 2022-05-05 MED ORDER — CLONAZEPAM 0.25 MG PO TBDP
0.2500 mg | ORAL_TABLET | Freq: Every day | ORAL | 0 refills | Status: DC | PRN
  Filled 2022-05-05 (×2): qty 10, 10d supply, fill #0

## 2022-05-05 MED ORDER — PROPRANOLOL HCL 20 MG PO TABS
20.0000 mg | ORAL_TABLET | Freq: Three times a day (TID) | ORAL | 0 refills | Status: DC | PRN
  Filled 2022-05-05: qty 30, 10d supply, fill #0

## 2022-05-05 NOTE — Addendum Note (Signed)
Addended by: Beatrice Lecher D on: 05/05/2022 12:42 PM   Modules accepted: Orders

## 2022-05-05 NOTE — Telephone Encounter (Signed)
Meds ordered this encounter  Medications   propranolol (INDERAL) 20 MG tablet    Sig: Take 1 tablet (20 mg total) by mouth 3 (three) times daily as needed.    Dispense:  30 tablet    Refill:  0

## 2022-05-06 ENCOUNTER — Other Ambulatory Visit (HOSPITAL_BASED_OUTPATIENT_CLINIC_OR_DEPARTMENT_OTHER): Payer: Self-pay

## 2022-07-16 ENCOUNTER — Other Ambulatory Visit (HOSPITAL_BASED_OUTPATIENT_CLINIC_OR_DEPARTMENT_OTHER): Payer: Self-pay

## 2022-08-03 ENCOUNTER — Ambulatory Visit: Payer: 59 | Admitting: Family Medicine

## 2022-08-03 ENCOUNTER — Other Ambulatory Visit (HOSPITAL_COMMUNITY): Payer: Self-pay

## 2022-08-03 VITALS — BP 150/90 | Ht 65.0 in | Wt 165.0 lb

## 2022-08-03 DIAGNOSIS — M7582 Other shoulder lesions, left shoulder: Secondary | ICD-10-CM | POA: Diagnosis not present

## 2022-08-03 DIAGNOSIS — M542 Cervicalgia: Secondary | ICD-10-CM

## 2022-08-03 MED ORDER — MELOXICAM 15 MG PO TABS
15.0000 mg | ORAL_TABLET | Freq: Every day | ORAL | 1 refills | Status: DC
  Filled 2022-08-03: qty 30, 30d supply, fill #0
  Filled 2022-09-13: qty 30, 30d supply, fill #1

## 2022-08-03 NOTE — Progress Notes (Unsigned)
PCP: Hali Marry, MD  Subjective:   HPI: Patient is a 51 y.o. left-hand dominant female here for left neck and left shoulder pain.  She reports left sided neck and left shoulder pain for several years (>5 years) but it's always been managed with massage, OTC meds, gentle stretching etc. Recently over the past several months her neck and shoulder pain are more bothersome in her day-to-day function.  Left shoulder: pain is located in anterior shoulder. Described as soreness that is constant. Worse when doing activity at 90 degrees of abduction. Associated popping, especially when she's doing her exercise program (resistance band training). Takes Tylenol or Ibuprofen occasionally which doesn't seem to help as much recently. No known trauma or injury. No numbness, tingling, or weakness. Does not wake her from sleep.  Left neck: described as tightness, pulling sensation. It's constant but worse with movement, especially turning head to left and lateral bending. Now interfering with driving somewhat. No known injury or trauma. Has not had any imaging of her neck (or shoulder). Has tried occasional Tylenol or Ibuprofen which recently aren't helping as much. Has tried massage therapy which helps very transiently. Has not tried ice/heat/topicals. Feels her neck pain is now starting to trigger headaches. No numbness, weakness, or tingling in her arm.   Past Medical History:  Diagnosis Date   Anxiety    Asthma    Basal cell carcinoma of skin of face 2007   Dr. Delman Cheadle    Current Outpatient Medications on File Prior to Visit  Medication Sig Dispense Refill   albuterol (VENTOLIN HFA) 108 (90 Base) MCG/ACT inhaler Inhale 1-2 puffs into the lungs every 6 hours as needed for wheezing or shortness of breath. 18 g PRN   clonazePAM (KLONOPIN) 0.25 MG disintegrating tablet Take 1 tablet (0.25 mg total) by mouth daily as needed. 10 tablet 0   fluticasone (FLONASE) 50 MCG/ACT nasal spray Place 2 sprays  into both nostrils daily. 16 g 11   propranolol (INDERAL) 20 MG tablet Take 1 tablet (20 mg total) by mouth 3 (three) times daily as needed. 30 tablet 0   sertraline (ZOLOFT) 100 MG tablet TAKE 1 TABLET (100 MG TOTAL) BY MOUTH DAILY. 90 tablet 1   No current facility-administered medications on file prior to visit.    Past Surgical History:  Procedure Laterality Date   MOHS SURGERY     removal of basal cell carcinoma    No Known Allergies  BP (!) 150/90   Ht '5\' 5"'$  (1.651 m)   Wt 165 lb (74.8 kg)   BMI 27.46 kg/m       Objective:  Physical Exam:  Gen: NAD, comfortable in exam room Left Shoulder: Inspection reveals no obvious deformity, atrophy, or asymmetry. No bruising. No swelling Palpation is normal with no TTP over [facility] joint or bicipital groove. Full ROM in flexion, abduction, internal/external rotation NV intact distally Special Tests:  - Impingement: Neg Hawkins, neers - Supraspinatous: Mild pain with empty can.  5-/5 strength with resisted flexion at 20 degrees - Infraspinatous/Teres Minor: 5/5 strength with ER - Subscapularis: negative belly press, 5/5 strength with IR - Biceps tendon: Negative Speeds - AC Joint: Negative cross arm - No painful arc and no drop arm sign  Neck: Inspection reveals no obvious deformity or skin changes. No midline tenderness to palpation. Mild tenderness to palpation of left trapezius with moderate trapezius muscle tightness noted. Good ROM with flexion, extension, rotation, and lateral bending. Left sided rotation and lateral bending elicits  pain. Negative Spurling's. NVI distally.   Assessment & Plan:  1. Left shoulder pain: likely secondary to rotator cuff tendinitis vs impingement. No red flags on history or exam. Expect improvement with NSAID course (Rx sent for Meloxicam) and rehab exercises-- will do formal physical therapy due to concurrent neck pain as below. 2. Left neck pain: due to trapezius muscle tightness. Will trial formal  PT and Meloxicam as her pain hasn't responded to other conservative measures (massage, OTC NSAIDs). Neck issues may be a result of her rotator cuff pathology as above.  Alcus Dad, MD PGY-3, [place]

## 2022-08-03 NOTE — Patient Instructions (Signed)
You have rotator cuff tendinitis/impingement but also trapezius overuse strain on the left. Try to avoid painful activities (overhead activities, lifting with extended arm) as much as possible. Meloxicam '15mg'$  daily with food for pain and inflammation - don't take aleve or ibuprofen while on this. Can take tylenol in addition to this. Start physical therapy with transition to home exercise program. Do home exercise program with theraband and scapular stabilization exercises daily 3 sets of 10 once a day. If not improving at follow-up we will consider imaging, injection, and/or nitro patches. Follow up with me in 5-6 weeks.

## 2022-08-04 ENCOUNTER — Encounter: Payer: Self-pay | Admitting: Family Medicine

## 2022-08-13 ENCOUNTER — Other Ambulatory Visit (HOSPITAL_BASED_OUTPATIENT_CLINIC_OR_DEPARTMENT_OTHER): Payer: Self-pay

## 2022-08-13 ENCOUNTER — Encounter: Payer: Self-pay | Admitting: Family Medicine

## 2022-08-13 ENCOUNTER — Ambulatory Visit: Payer: 59 | Admitting: Family Medicine

## 2022-08-13 VITALS — BP 150/73 | HR 71 | Ht 65.0 in | Wt 171.0 lb

## 2022-08-13 DIAGNOSIS — I10 Essential (primary) hypertension: Secondary | ICD-10-CM | POA: Diagnosis not present

## 2022-08-13 DIAGNOSIS — R0602 Shortness of breath: Secondary | ICD-10-CM

## 2022-08-13 MED ORDER — LOSARTAN POTASSIUM-HCTZ 50-12.5 MG PO TABS
1.0000 | ORAL_TABLET | Freq: Every day | ORAL | 0 refills | Status: DC
  Filled 2022-08-13: qty 90, 90d supply, fill #0

## 2022-08-13 NOTE — Patient Instructions (Signed)
Please start hafl a tab of BP pill for 10 days. If Blood pressure is greater than 130 then go up to whole tab daily.

## 2022-08-13 NOTE — Progress Notes (Signed)
Established Patient Office Visit  Subjective   Patient ID: Sue [name], female    DOB: 1971-08-30  Age: 51 y.o. MRN: [id number]  Chief Complaint  Patient presents with   Hypertension    HPI  F/U for recent BP elevations. Home blood pressure cuff monitor showing blood pressures in the 170s to 80s but she thinks it is probably not accurate.  She did review the [employer] and felt like she was already doing a lot of the plan consistent with that.  She has been exercising regularly.  She has been doing cardio 3 times a week with walking and she has been doing some resistance training 3 times a week as well.   She just feels like in the last year her body has changed she just notices that she is more short of breath with exercise.  She denies snoring.  She feels like overall she is doing okay except for work stressors which are quite high.    ROS    Objective:     BP (!) 150/73   Pulse 71   Ht $R'5\' 5"'qZ$  (1.651 m)   Wt 171 lb (77.6 kg)   SpO2 99%   BMI 28.46 kg/m    Physical Exam Vitals and nursing note reviewed.  Constitutional:      Appearance: She is well-developed.  HENT:     Head: Normocephalic and atraumatic.  Cardiovascular:     Rate and Rhythm: Normal rate and regular rhythm.     Heart sounds: Normal heart sounds.  Pulmonary:     Effort: Pulmonary effort is normal.     Breath sounds: Normal breath sounds.  Skin:    General: Skin is warm and dry.  Neurological:     Mental Status: She is alert and oriented to person, place, and time.  Psychiatric:        Behavior: Behavior normal.      Results for orders placed or performed in visit on 08/13/22  CBC  Result Value Ref Range   WBC 9.5 3.8 - 10.8 Thousand/uL   RBC 4.44 3.80 - 5.10 Million/uL   Hemoglobin 13.6 11.7 - 15.5 g/dL   HCT 41.2 35.0 - 45.0 %   MCV 92.8 80.0 - 100.0 fL   MCH 30.6 27.0 - 33.0 pg   MCHC 33.0 32.0 - 36.0 g/dL   RDW 11.4 11.0 - 15.0 %   Platelets 185 140 - 400 Thousand/uL   MPV  11.7 7.5 - 12.5 fL  COMPLETE METABOLIC PANEL WITH GFR  Result Value Ref Range   Glucose, Bld 94 65 - 99 mg/dL   BUN 15 7 - 25 mg/dL   Creat 0.79 0.50 - 0.99 mg/dL   eGFR 92 > OR = 60 mL/min/1.59m2   BUN/Creatinine Ratio SEE NOTE: 6 - 22 (calc)   Sodium 140 135 - 146 mmol/L   Potassium 3.9 3.5 - 5.3 mmol/L   Chloride 106 98 - 110 mmol/L   CO2 28 20 - 32 mmol/L   Calcium 9.2 8.6 - 10.2 mg/dL   Total Protein 6.8 6.1 - 8.1 g/dL   Albumin 4.3 3.6 - 5.1 g/dL   Globulin 2.5 1.9 - 3.7 g/dL (calc)   AG Ratio 1.7 1.0 - 2.5 (calc)   Total Bilirubin 0.4 0.2 - 1.2 mg/dL   Alkaline phosphatase (APISO) 58 31 - 125 U/L   AST 20 10 - 35 U/L   ALT 28 6 - 29 U/L  TSH  Result Value Ref Range  TSH 1.53 mIU/L      The 10-year ASCVD risk score (Arnett DK, et al., 2019) is: 1.2%    Assessment & Plan:   Problem List Items Addressed This Visit       Cardiovascular and Mediastinum   Primary hypertension - Primary    Blood pressures have been pretty consistently elevated over the last couple of months.  She is really done a great job in making some lifestyle changes including adjusting her diet.  She has been working out regularly which is fantastic.  We discussed doing some additional labs just to rule out thyroid disorder etc.  No known history of coronary artery disease but she is interested in getting a calcium CT score.  No history of diabetes.  She does not snore.  We discussed the option of going ahead and starting treatment for blood pressure.      Relevant Medications   losartan-hydrochlorothiazide (HYZAAR) 50-12.5 MG tablet   Other Relevant Orders   CBC (Completed)   COMPLETE METABOLIC PANEL WITH GFR (Completed)   TSH (Completed)   Aldosterone + renin activity w/ ratio   CT CARDIAC SCORING (SELF PAY ONLY)     Other   Exercise-induced shortness of breath    Difficult to say potential cause.  I do wanted to get her blood pressure under control and see if that makes a difference.   Especially considering that exercise typically increases blood pressure transiently but in the long-term actually decreases it.  There may be some element of deconditioning.  Also consider new onset asthma etc.  That she is not having any wheezing.  Also consider moving forward with cardiac CT just to make sure there is no significant plaque deposition.  If normal then consider more pulmonary work-up.       Return in about 4 weeks (around 09/10/2022) for Hypertension.    Beatrice Lecher, MD

## 2022-08-13 NOTE — Assessment & Plan Note (Signed)
Blood pressures have been pretty consistently elevated over the last couple of months.  She is really done a great job in making some lifestyle changes including adjusting her diet.  She has been working out regularly which is fantastic.  We discussed doing some additional labs just to rule out thyroid disorder etc.  No known history of coronary artery disease but she is interested in getting a calcium CT score.  No history of diabetes.  She does not snore.  We discussed the option of going ahead and starting treatment for blood pressure.

## 2022-08-14 ENCOUNTER — Ambulatory Visit: Payer: 59 | Admitting: Family Medicine

## 2022-08-14 DIAGNOSIS — R0602 Shortness of breath: Secondary | ICD-10-CM | POA: Insufficient documentation

## 2022-08-14 NOTE — Progress Notes (Signed)
Hi Sue [name], good to see you yesterday.  Labs look great so far.

## 2022-08-14 NOTE — Assessment & Plan Note (Addendum)
Difficult to say potential cause.  I do wanted to get her blood pressure under control and see if that makes a difference.  Especially considering that exercise typically increases blood pressure transiently but in the long-term actually decreases it.  There may be some element of deconditioning.  Also consider new onset asthma etc.  That she is not having any wheezing.  Also consider moving forward with cardiac CT just to make sure there is no significant plaque deposition.  If normal then consider more pulmonary work-up.

## 2022-08-18 LAB — COMPLETE METABOLIC PANEL WITH GFR
AG Ratio: 1.7 (calc) (ref 1.0–2.5)
ALT: 28 U/L (ref 6–29)
AST: 20 U/L (ref 10–35)
Albumin: 4.3 g/dL (ref 3.6–5.1)
Alkaline phosphatase (APISO): 58 U/L (ref 31–125)
BUN: 15 mg/dL (ref 7–25)
CO2: 28 mmol/L (ref 20–32)
Calcium: 9.2 mg/dL (ref 8.6–10.2)
Chloride: 106 mmol/L (ref 98–110)
Creat: 0.79 mg/dL (ref 0.50–0.99)
Globulin: 2.5 g/dL (calc) (ref 1.9–3.7)
Glucose, Bld: 94 mg/dL (ref 65–99)
Potassium: 3.9 mmol/L (ref 3.5–5.3)
Sodium: 140 mmol/L (ref 135–146)
Total Bilirubin: 0.4 mg/dL (ref 0.2–1.2)
Total Protein: 6.8 g/dL (ref 6.1–8.1)
eGFR: 92 mL/min/{1.73_m2} (ref >=60)

## 2022-08-18 LAB — CBC
HCT: 41.2 % (ref 35.0–45.0)
Hemoglobin: 13.6 g/dL (ref 11.7–15.5)
MCH: 30.6 pg (ref 27.0–33.0)
MCHC: 33 g/dL (ref 32.0–36.0)
MCV: 92.8 fL (ref 80.0–100.0)
MPV: 11.7 fL (ref 7.5–12.5)
Platelets: 185 10*3/uL (ref 140–400)
RBC: 4.44 10*6/uL (ref 3.80–5.10)
RDW: 11.4 % (ref 11.0–15.0)
WBC: 9.5 10*3/uL (ref 3.8–10.8)

## 2022-08-18 LAB — ALDOSTERONE + RENIN ACTIVITY W/ RATIO
ALDO / PRA Ratio: 7.3 Ratio (ref 0.9–28.9)
Aldosterone: 3 ng/dL
Renin Activity: 0.41 ng/mL/h (ref 0.25–5.82)

## 2022-08-18 LAB — TSH: TSH: 1.53 mIU/L

## 2022-08-19 NOTE — Progress Notes (Signed)
Hi Sue [name], aldosterone levels look good.  Just wanted to rule this out as a potential cause for some of the blood pressure issues.  If they have not contacted you about scheduling the CT, please let us know.

## 2022-09-07 ENCOUNTER — Ambulatory Visit: Payer: 59 | Admitting: Family Medicine

## 2022-09-07 DIAGNOSIS — M7582 Other shoulder lesions, left shoulder: Secondary | ICD-10-CM | POA: Diagnosis not present

## 2022-09-07 NOTE — Patient Instructions (Signed)
You have rotator cuff tendinitis/impingement but also trapezius overuse strain on the left. Try to avoid painful activities (overhead activities, lifting with extended arm) as much as possible. Meloxicam '15mg'$  daily with food for pain and inflammation - don't take aleve or ibuprofen while on this. Can take tylenol in addition to this. Do home exercise program with theraband and scapular stabilization exercises daily 3 sets of 10 once a day. Consider physical therapy if not improving - call us if you want to do this. Follow up with me in 6 weeks.

## 2022-09-08 ENCOUNTER — Encounter: Payer: Self-pay | Admitting: Family Medicine

## 2022-09-08 NOTE — Progress Notes (Signed)
PCP: Hali Marry, MD  Subjective:   HPI: Patient is a 51 y.o. female here for left shoulder pain.  9/11: Patient is a 51 y.o. left-hand dominant female here for left neck and left shoulder pain.  She reports left sided neck and left shoulder pain for several years (>5 years) but it's always been managed with massage, OTC meds, gentle stretching etc. Recently over the past several months her neck and shoulder pain are more bothersome in her day-to-day function.  Left shoulder: pain is located in anterior shoulder. Described as soreness that is constant. Worse when doing activity at 90 degrees of abduction. Associated popping, especially when she's doing her exercise program (resistance band training). Takes Tylenol or Ibuprofen occasionally which doesn't seem to help as much recently. No known trauma or injury. No numbness, tingling, or weakness. Does not wake her from sleep.  Left neck: described as tightness, pulling sensation. It's constant but worse with movement, especially turning head to left and lateral bending. Now interfering with driving somewhat. No known injury or trauma. Has not had any imaging of her neck (or shoulder). Has tried occasional Tylenol or Ibuprofen which recently aren't helping as much. Has tried massage therapy which helps very transiently. Has not tried ice/heat/topicals. Feels her neck pain is now starting to trigger headaches. No numbness, weakness, or tingling in her arm.  10/16: Patient reports she has improved since last visit. Feels meloxicam has helped quite a bit as well as changes in carrying, lifting. Notes popping when she lifts arm out to side and overhead. She did not do physical therapy. Sleeps on opposite side now - not waking her up.  Past Medical History:  Diagnosis Date   Anxiety    Asthma    Basal cell carcinoma of skin of face 2007   Dr. Delman Cheadle    Current Outpatient Medications on File Prior to Visit  Medication Sig Dispense  Refill   albuterol (VENTOLIN HFA) 108 (90 Base) MCG/ACT inhaler Inhale 1-2 puffs into the lungs every 6 hours as needed for wheezing or shortness of breath. 18 g PRN   clonazePAM (KLONOPIN) 0.25 MG disintegrating tablet Take 1 tablet (0.25 mg total) by mouth daily as needed. 10 tablet 0   fluticasone (FLONASE) 50 MCG/ACT nasal spray Place 2 sprays into both nostrils daily. 16 g 11   losartan-hydrochlorothiazide (HYZAAR) 50-12.5 MG tablet Take 1 tablet by mouth daily. 90 tablet 0   meloxicam (MOBIC) 15 MG tablet Take 1 tablet (15 mg total) by mouth daily. 30 tablet 1   propranolol (INDERAL) 20 MG tablet Take 1 tablet (20 mg total) by mouth 3 (three) times daily as needed. 30 tablet 0   sertraline (ZOLOFT) 100 MG tablet TAKE 1 TABLET (100 MG TOTAL) BY MOUTH DAILY. 90 tablet 1   No current facility-administered medications on file prior to visit.    Past Surgical History:  Procedure Laterality Date   MOHS SURGERY     removal of basal cell carcinoma    No Known Allergies  BP (!) 144/70   Ht '5\' 5"'$  (1.651 m)   BMI 28.46 kg/m       No data to display              No data to display              Objective:  Physical Exam:  Gen: NAD, comfortable in exam room  Left shoulder: No swelling, ecchymoses.  No gross deformity. No TTP. FROM. Mild positive  Hawkins, negative Neers. Negative Yergasons. Strength 5/5 with empty can and resisted internal/external rotation. Negative apprehension. NV intact distally.   Assessment & Plan:  1. Left shoulder pain - 2/2 rotator cuff impingement/tendinopathy.  She is improved with relative rest and meloxicam.  Will add home exercise program which was reviewed today.  Consider physical therapy if not improving.  F/u in 6 weeks.

## 2022-09-11 ENCOUNTER — Ambulatory Visit: Payer: 59 | Admitting: Family Medicine

## 2022-09-14 ENCOUNTER — Other Ambulatory Visit (HOSPITAL_COMMUNITY): Payer: Self-pay

## 2022-09-15 ENCOUNTER — Other Ambulatory Visit (HOSPITAL_COMMUNITY): Payer: Self-pay

## 2022-09-15 DIAGNOSIS — I10 Essential (primary) hypertension: Secondary | ICD-10-CM | POA: Diagnosis not present

## 2022-09-15 DIAGNOSIS — Z6820 Body mass index (BMI) 20.0-20.9, adult: Secondary | ICD-10-CM | POA: Diagnosis not present

## 2022-09-15 DIAGNOSIS — Z1211 Encounter for screening for malignant neoplasm of colon: Secondary | ICD-10-CM | POA: Diagnosis not present

## 2022-09-15 DIAGNOSIS — Z1239 Encounter for other screening for malignant neoplasm of breast: Secondary | ICD-10-CM | POA: Diagnosis not present

## 2022-09-15 DIAGNOSIS — Z01419 Encounter for gynecological examination (general) (routine) without abnormal findings: Secondary | ICD-10-CM | POA: Diagnosis not present

## 2022-09-15 DIAGNOSIS — Z124 Encounter for screening for malignant neoplasm of cervix: Secondary | ICD-10-CM | POA: Diagnosis not present

## 2022-09-17 ENCOUNTER — Other Ambulatory Visit (HOSPITAL_BASED_OUTPATIENT_CLINIC_OR_DEPARTMENT_OTHER): Payer: Self-pay

## 2022-09-17 MED ORDER — INFLUENZA VAC SPLIT QUAD 0.5 ML IM SUSY
PREFILLED_SYRINGE | INTRAMUSCULAR | 0 refills | Status: DC
  Filled 2022-09-17: qty 0.5, 1d supply, fill #0

## 2022-10-06 ENCOUNTER — Other Ambulatory Visit (HOSPITAL_COMMUNITY): Payer: Self-pay

## 2022-10-06 ENCOUNTER — Encounter: Payer: Self-pay | Admitting: Family Medicine

## 2022-10-06 ENCOUNTER — Ambulatory Visit: Payer: 59 | Admitting: Family Medicine

## 2022-10-06 VITALS — BP 118/63 | HR 81 | Ht 65.0 in | Wt 167.0 lb

## 2022-10-06 DIAGNOSIS — I10 Essential (primary) hypertension: Secondary | ICD-10-CM

## 2022-10-06 DIAGNOSIS — F411 Generalized anxiety disorder: Secondary | ICD-10-CM | POA: Diagnosis not present

## 2022-10-06 MED ORDER — SERTRALINE HCL 100 MG PO TABS
ORAL_TABLET | Freq: Every day | ORAL | 3 refills | Status: DC
  Filled 2022-10-06: qty 90, 90d supply, fill #0
  Filled 2023-01-20: qty 90, 90d supply, fill #1
  Filled 2023-04-30: qty 90, 90d supply, fill #2
  Filled 2023-08-06: qty 90, 90d supply, fill #3

## 2022-10-06 MED ORDER — LOSARTAN POTASSIUM-HCTZ 50-12.5 MG PO TABS
1.0000 | ORAL_TABLET | Freq: Every day | ORAL | 3 refills | Status: DC
  Filled 2022-10-06 – 2022-11-28 (×3): qty 90, 90d supply, fill #0
  Filled 2023-03-01: qty 90, 90d supply, fill #1
  Filled 2023-06-15: qty 90, 90d supply, fill #2
  Filled 2023-09-29: qty 90, 90d supply, fill #3

## 2022-10-06 MED ORDER — CLONAZEPAM 0.25 MG PO TBDP
0.2500 mg | ORAL_TABLET | Freq: Every day | ORAL | 0 refills | Status: DC | PRN
  Filled 2022-10-06: qty 10, 10d supply, fill #0

## 2022-10-06 NOTE — Progress Notes (Addendum)
   Established Patient Office Visit  Subjective   Patient ID: SHALAN NEAULT, female    DOB: [birth year]  Age: 51 y.o. MRN: [id number]  Chief Complaint  Patient presents with   Hypertension    HPI  Hypertension- Pt denies chest pain, SOB, dizziness, or heart palpitations.  Taking meds as directed w/o problems.  Denies medication side effects.  Brought in home cuff and it is reading much higher than ours.  Tolerating the new medication well she does notice that she gets up little earlier to urinate.   She is scheduled for cardiac CT on December 1.  They were booked 2 months out.  Follow-up generalized anxiety disorder-overall she is doing really well on the sertraline.  Would like a refill on the clonazepam.  She has been exercising 3 to 4 days/week.  She has noticed a trend where she has had increased anxiety right before her.  She will feel really anxious for about a day and then get a bad headache and usually the next day will start her menstrual cycle.    ROS    Objective:     BP 118/63   Pulse 81   Ht '5\' 5"'$  (1.651 m)   Wt 167 lb (75.8 kg)   SpO2 98%   BMI 27.79 kg/m    Physical Exam Vitals and nursing note reviewed.  Constitutional:      Appearance: She is well-developed.  HENT:     Head: Normocephalic and atraumatic.  Cardiovascular:     Rate and Rhythm: Normal rate and regular rhythm.     Heart sounds: Normal heart sounds.  Pulmonary:     Effort: Pulmonary effort is normal.     Breath sounds: Normal breath sounds.  Skin:    General: Skin is warm and dry.  Neurological:     Mental Status: She is alert and oriented to person, place, and time.  Psychiatric:        Behavior: Behavior normal.     No results found for any visits on 10/06/22.    The 10-year ASCVD risk score (Arnett DK, et al., 2019) is: 0.8%    Assessment & Plan:   Problem List Items Addressed This Visit       Cardiovascular and Mediastinum   Primary hypertension - Primary     Well controlled. Continue current regimen. Follow up in  54mo      Relevant Medications   losartan-hydrochlorothiazide (HYZAAR) 50-12.5 MG tablet     Other   ANXIETY DISORDER, GENERALIZED    Well overall she has some external stressors right now but does not feel like she wants to make any changes to her current regimen she is happy with it for now.      Relevant Medications   sertraline (ZOLOFT) 100 MG tablet   clonazePAM (KLONOPIN) 0.25 MG disintegrating tablet    Return in about 6 months (around 04/06/2023) for Hypertension.    CBeatrice Lecher MD

## 2022-10-06 NOTE — Assessment & Plan Note (Signed)
Well controlled. Continue current regimen. Follow up in  6 mo  

## 2022-10-06 NOTE — Assessment & Plan Note (Signed)
Well overall she has some external stressors right now but does not feel like she wants to make any changes to her current regimen she is happy with it for now.

## 2022-10-07 ENCOUNTER — Other Ambulatory Visit (HOSPITAL_COMMUNITY): Payer: Self-pay

## 2022-10-07 DIAGNOSIS — L578 Other skin changes due to chronic exposure to nonionizing radiation: Secondary | ICD-10-CM | POA: Diagnosis not present

## 2022-10-07 DIAGNOSIS — Z85828 Personal history of other malignant neoplasm of skin: Secondary | ICD-10-CM | POA: Diagnosis not present

## 2022-10-07 DIAGNOSIS — L219 Seborrheic dermatitis, unspecified: Secondary | ICD-10-CM | POA: Diagnosis not present

## 2022-10-07 DIAGNOSIS — D225 Melanocytic nevi of trunk: Secondary | ICD-10-CM | POA: Diagnosis not present

## 2022-10-07 DIAGNOSIS — D2372 Other benign neoplasm of skin of left lower limb, including hip: Secondary | ICD-10-CM | POA: Diagnosis not present

## 2022-10-07 DIAGNOSIS — L570 Actinic keratosis: Secondary | ICD-10-CM | POA: Diagnosis not present

## 2022-10-07 DIAGNOSIS — L821 Other seborrheic keratosis: Secondary | ICD-10-CM | POA: Diagnosis not present

## 2022-10-07 DIAGNOSIS — Z808 Family history of malignant neoplasm of other organs or systems: Secondary | ICD-10-CM | POA: Diagnosis not present

## 2022-10-07 MED ORDER — FLUOROURACIL 5 % EX CREA
1.0000 "application " | TOPICAL_CREAM | Freq: Every day | CUTANEOUS | 0 refills | Status: AC
  Filled 2022-10-07: qty 40, 21d supply, fill #0

## 2022-10-07 MED ORDER — KETOCONAZOLE 2 % EX CREA
1.0000 | TOPICAL_CREAM | Freq: Every day | CUTANEOUS | 1 refills | Status: AC | PRN
  Filled 2022-10-07: qty 45, 30d supply, fill #0

## 2022-10-08 ENCOUNTER — Other Ambulatory Visit (HOSPITAL_COMMUNITY): Payer: Self-pay

## 2022-10-09 ENCOUNTER — Other Ambulatory Visit (HOSPITAL_COMMUNITY): Payer: Self-pay

## 2022-10-19 ENCOUNTER — Ambulatory Visit: Payer: 59 | Admitting: Family Medicine

## 2022-10-19 VITALS — BP 110/80 | Ht 66.0 in | Wt 167.0 lb

## 2022-10-19 DIAGNOSIS — M7582 Other shoulder lesions, left shoulder: Secondary | ICD-10-CM | POA: Diagnosis not present

## 2022-10-19 NOTE — Progress Notes (Unsigned)
  SUBJECTIVE:   CHIEF COMPLAINT / HPI:   Sue [name] is a 51 yo presenting for follow-up on left shoulder pain.  9/11: Patient is a 51 y.o. left-hand dominant female here for left neck and left shoulder pain.   She reports left sided neck and left shoulder pain for several years (>5 years) but it's always been managed with massage, OTC meds, gentle stretching etc. Recently over the past several months her neck and shoulder pain are more bothersome in her day-to-day function.   Left shoulder: pain is located in anterior shoulder. Described as soreness that is constant. Worse when doing activity at 90 degrees of abduction. Associated popping, especially when she's doing her exercise program (resistance band training). Takes Tylenol or Ibuprofen occasionally which doesn't seem to help as much recently. No known trauma or injury. No numbness, tingling, or weakness. Does not wake her from sleep.   Left neck: described as tightness, pulling sensation. It's constant but worse with movement, especially turning head to left and lateral bending. Now interfering with driving somewhat. No known injury or trauma. Has not had any imaging of her neck (or shoulder). Has tried occasional Tylenol or Ibuprofen which recently aren't helping as much. Has tried massage therapy which helps very transiently. Has not tried ice/heat/topicals. Feels her neck pain is now starting to trigger headaches. No numbness, weakness, or tingling in her arm.   10/16: Patient reports she has improved since last visit. Feels meloxicam has helped quite a bit as well as changes in carrying, lifting. Notes popping when she lifts arm out to side and overhead. She did not do physical therapy. Sleeps on opposite side now - not waking her up.  11/27: Her pain continues to improve. She finished course of meloxicam and found this to be helpful.  She continues to have pain with raising arm and occasional clicking. The home exercises have  been helpful.  Difficulty sleeping on left side.  PERTINENT  PMH / PSH: Reviewed  Past Medical History:  Diagnosis Date   Anxiety    Asthma    Basal cell carcinoma of skin of face 2007   Dr. Delman Cheadle    OBJECTIVE:  BP 110/80   Ht '5\' 6"'$  (1.676 m)   Wt 167 lb (75.8 kg)   BMI 26.95 kg/m  Constitutional: well-appearing   MSK:  Left shoulder: No deformity. No TTP to [facility] or AC joint, no crepitus Full ROM bilaterally Hawkins kennedy test positive Neers negative Yergasons negative Speeds negative  ASSESSMENT/PLAN:  Left shoulder pain- 2/2 rotator cuff impingement/ tendinopathy. Her pain continues to improve, but affects how she is sleeping and other activities. At this point she would like to see if pain continues to improve before going to formal physical therapy. We talked about her calling back in 2 weeks if she is interested in PT.  Motty Borin M. Smantha Boakye, D.O.  Internal Medicine Resident, PGY-2 [facility]  Pager: [phone]

## 2022-10-20 ENCOUNTER — Encounter: Payer: Self-pay | Admitting: Family Medicine

## 2022-10-21 NOTE — Telephone Encounter (Signed)
Completed and given to [place].  Would you be able to scan it in to send it back to her?

## 2022-10-22 IMAGING — MG MM DIGITAL SCREENING BILAT W/ TOMO AND CAD
6 of 10 series · 6 of 30 positions shown · non-contrast
Comparison: Previous exam(s).

CLINICAL DATA: Screening.

EXAM:
DIGITAL SCREENING BILATERAL MAMMOGRAM WITH TOMOSYNTHESIS AND CAD
TECHNIQUE: Bilateral screening digital craniocaudal and mediolateral oblique
mammograms were obtained. Bilateral screening digital breast
tomosynthesis was performed. The images were evaluated with
computer-aided detection.

[R MLO synth-2D (1 of 2)]
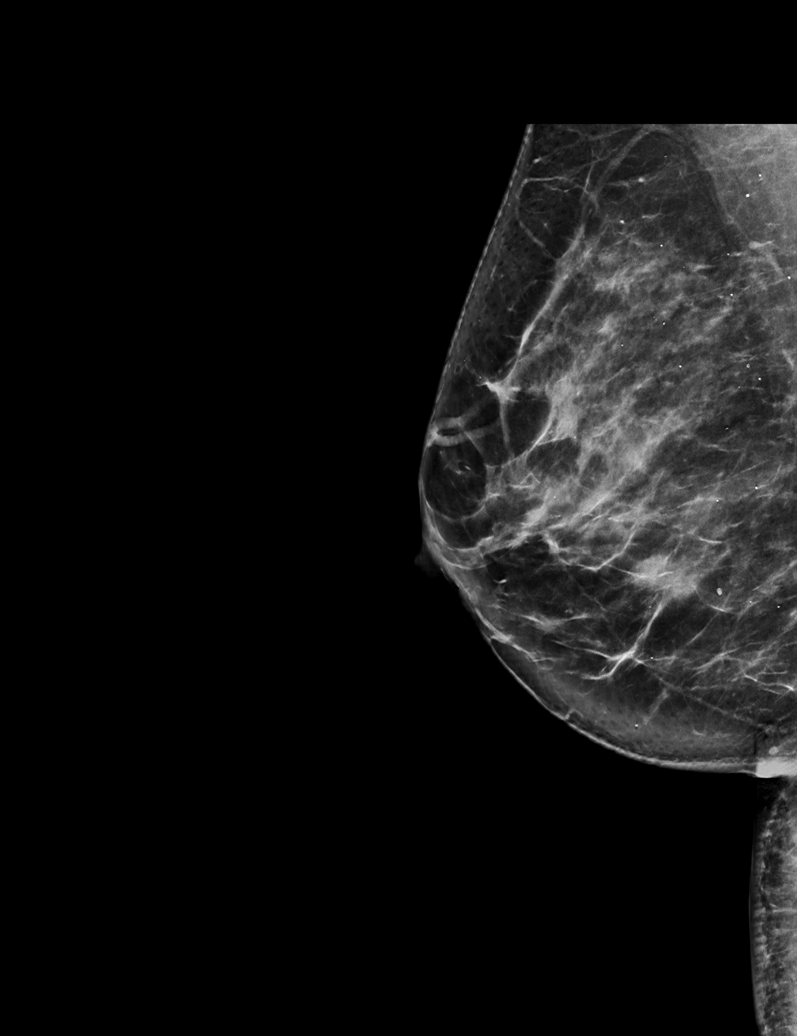

[R MLO synth-2D (2 of 2)]
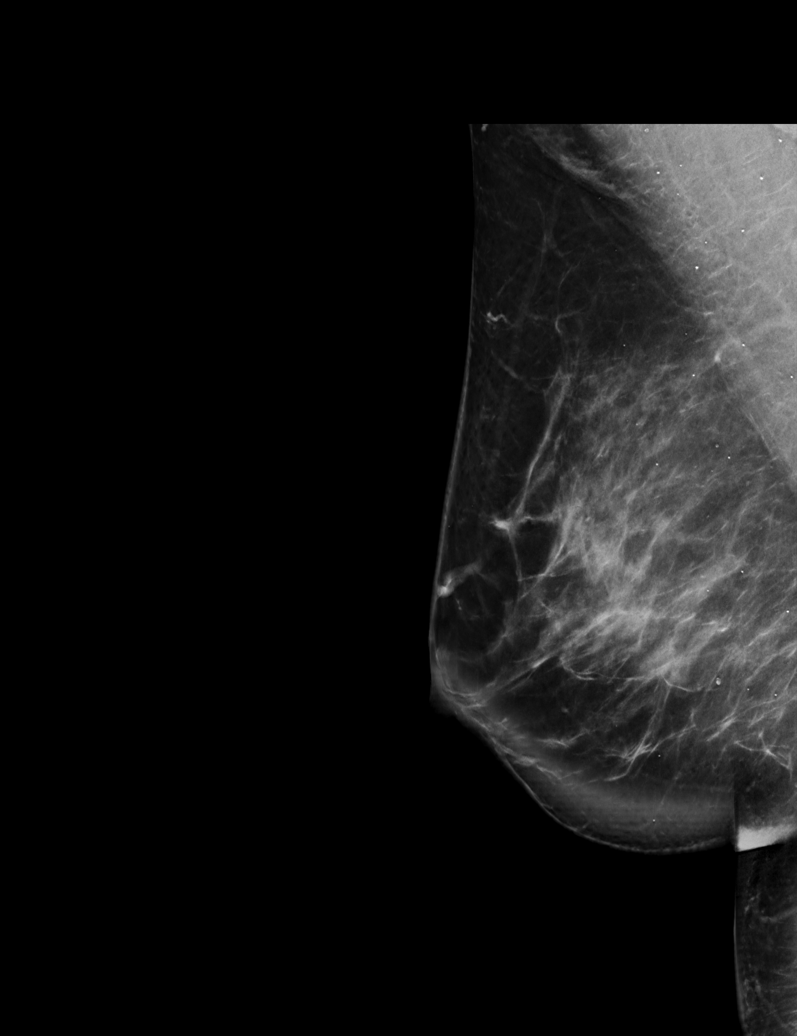

[L CC synth-2D]
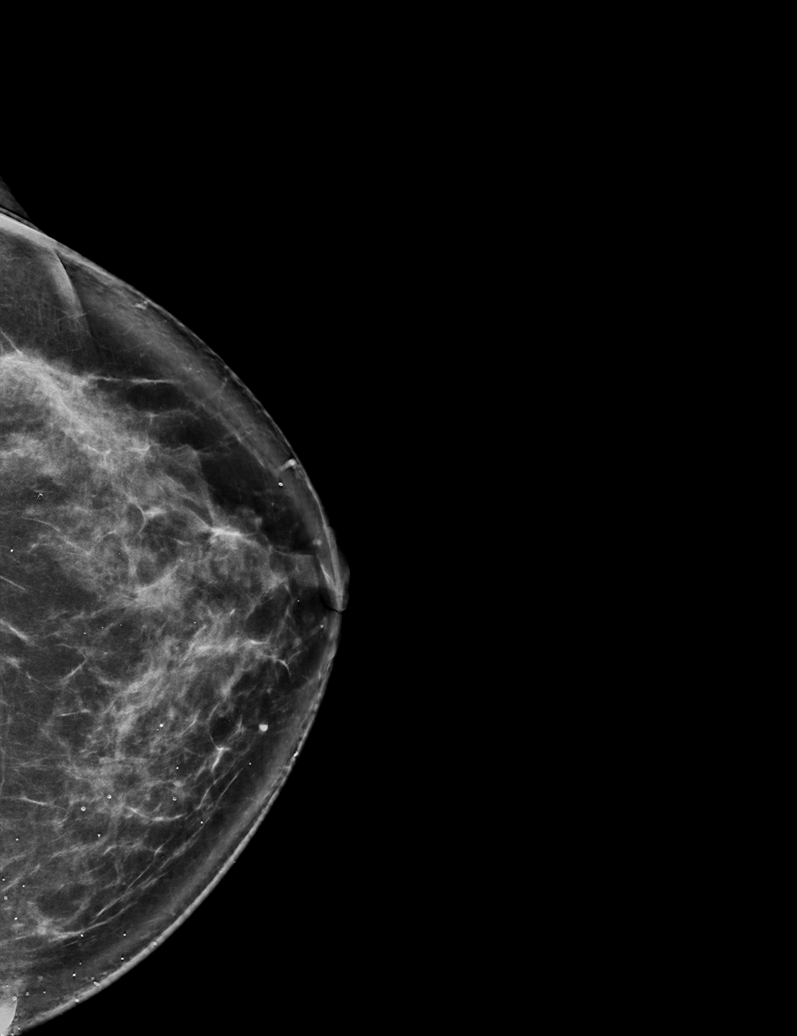

[L MLO synth-2D]
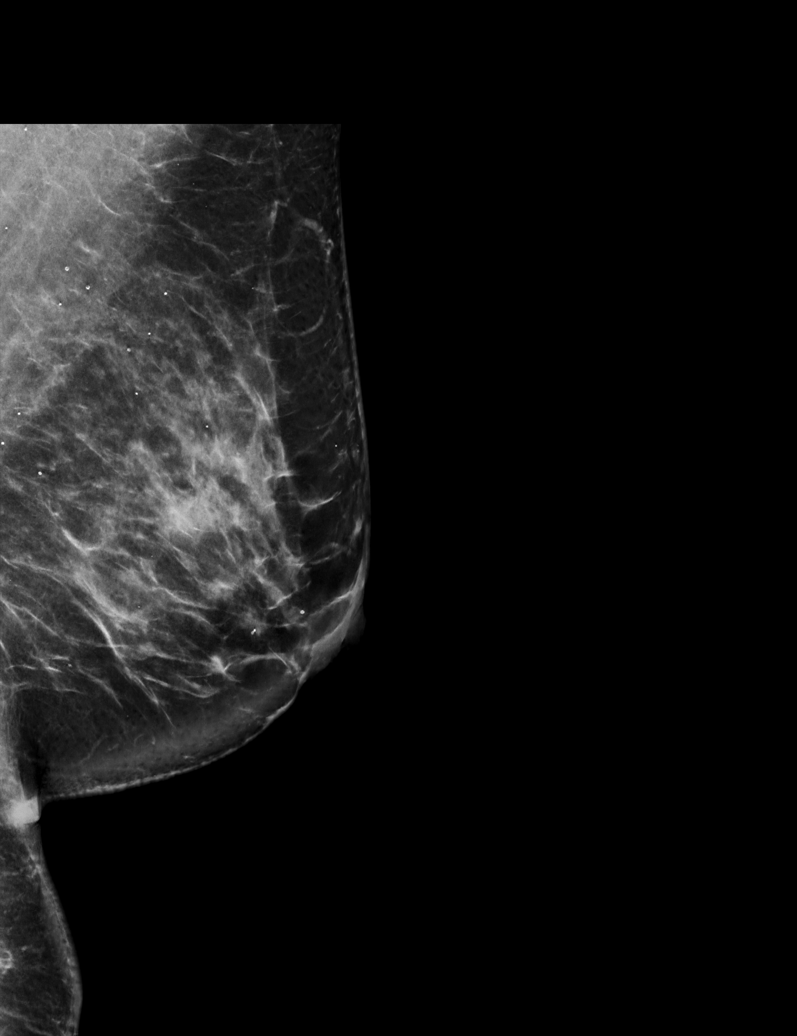

[R CC synth-2D]
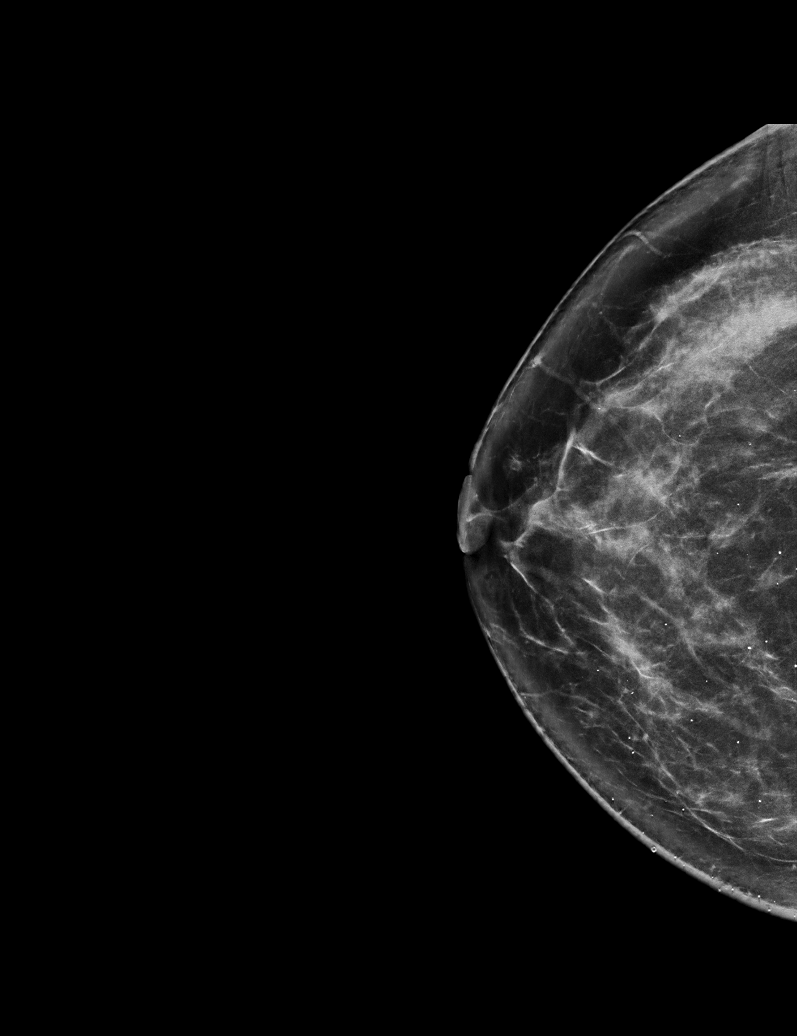

[R MLO tomo · tomo slice 52/103.0]
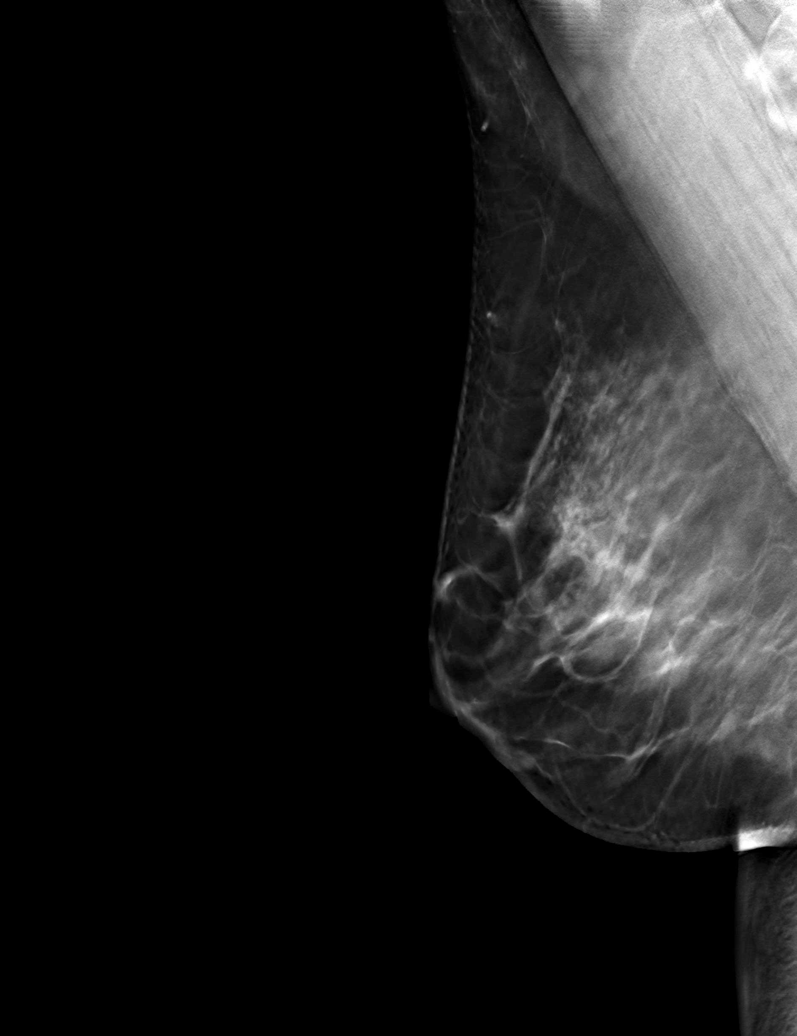

[6 of 30 positions shown; findings below may reference images not displayed]

ACR Breast Density Category c: The breast tissue is heterogeneously
dense, which may obscure small masses.
FINDINGS: There are no findings suspicious for malignancy.
IMPRESSION: No mammographic evidence of malignancy. A result letter of this
screening mammogram will be mailed directly to the patient.

RECOMMENDATION:
Screening mammogram in one year. (Code:Q3-W-BC3)

BI-RADS CATEGORY  1: Negative.

## 2022-10-23 ENCOUNTER — Ambulatory Visit (HOSPITAL_BASED_OUTPATIENT_CLINIC_OR_DEPARTMENT_OTHER)
Admission: RE | Admit: 2022-10-23 | Discharge: 2022-10-23 | Disposition: A | Discharge status: Home / Self Care | Payer: 59 | Source: Ambulatory Visit | Attending: Family Medicine | Admitting: Family Medicine

## 2022-10-23 DIAGNOSIS — I10 Essential (primary) hypertension: Secondary | ICD-10-CM | POA: Insufficient documentation

## 2022-10-26 NOTE — Progress Notes (Signed)
Sue [name], both parts of your results are back.  Assume score was 0 which is fantastic.  It means that you are very low risk for progressive coronary artery disease and so they do not recommend starting any type of medication or therapy such as a statin at this point.  They do recommend considering to reevaluate in 5 to 10 years.  Everything else looks unremarkable as well.

## 2022-11-26 ENCOUNTER — Other Ambulatory Visit (HOSPITAL_COMMUNITY): Payer: Self-pay

## 2022-11-28 ENCOUNTER — Other Ambulatory Visit (HOSPITAL_BASED_OUTPATIENT_CLINIC_OR_DEPARTMENT_OTHER): Payer: Self-pay

## 2022-12-07 ENCOUNTER — Other Ambulatory Visit (HOSPITAL_COMMUNITY): Payer: Self-pay

## 2022-12-10 ENCOUNTER — Other Ambulatory Visit (HOSPITAL_COMMUNITY): Payer: Self-pay

## 2023-03-01 ENCOUNTER — Other Ambulatory Visit: Payer: Self-pay

## 2023-03-17 ENCOUNTER — Other Ambulatory Visit: Payer: Self-pay | Admitting: Family Medicine

## 2023-04-06 ENCOUNTER — Ambulatory Visit: Payer: Commercial Managed Care - PPO | Admitting: Family Medicine

## 2023-04-06 ENCOUNTER — Other Ambulatory Visit (HOSPITAL_COMMUNITY): Payer: Self-pay

## 2023-04-06 VITALS — BP 121/58 | HR 87 | Ht 66.0 in | Wt 178.0 lb

## 2023-04-06 DIAGNOSIS — J4520 Mild intermittent asthma, uncomplicated: Secondary | ICD-10-CM | POA: Diagnosis not present

## 2023-04-06 DIAGNOSIS — M7582 Other shoulder lesions, left shoulder: Secondary | ICD-10-CM | POA: Diagnosis not present

## 2023-04-06 DIAGNOSIS — F411 Generalized anxiety disorder: Secondary | ICD-10-CM

## 2023-04-06 DIAGNOSIS — I10 Essential (primary) hypertension: Secondary | ICD-10-CM

## 2023-04-06 MED ORDER — CLONAZEPAM 0.25 MG PO TBDP
0.2500 mg | ORAL_TABLET | Freq: Every day | ORAL | 0 refills | Status: DC | PRN
Start: 2023-04-06 — End: 2023-10-20
  Filled 2023-04-06: qty 10, 10d supply, fill #0

## 2023-04-06 MED ORDER — ALBUTEROL SULFATE HFA 108 (90 BASE) MCG/ACT IN AERS
1.0000 | INHALATION_SPRAY | Freq: Four times a day (QID) | RESPIRATORY_TRACT | 99 refills | Status: AC | PRN
Start: 2023-04-06 — End: ?
  Filled 2023-04-06: qty 6.7, 25d supply, fill #0

## 2023-04-06 MED ORDER — MELOXICAM 15 MG PO TABS
15.0000 mg | ORAL_TABLET | Freq: Every day | ORAL | 1 refills | Status: DC
Start: 2023-04-06 — End: 2023-10-20
  Filled 2023-04-06: qty 30, 30d supply, fill #0

## 2023-04-06 NOTE — Progress Notes (Signed)
Established Patient Office Visit  Subjective   Patient ID: Sue [name], female    DOB: July 01, 1971  Age: 52 y.o. MRN: [id number]  Chief Complaint  Patient presents with   Hypertension    HPI Hypertension- Pt denies chest pain, SOB, dizziness, or heart palpitations.  Taking meds as directed w/o problems.  Denies medication side effects.     F/U Asthma  -she has had to use her rescue inhaler little bit more frequently over the last week and a half she just had a very sore throat and some extra drainage and some allergies also wonders if maybe she has had a virus as well she just feels like it is taking a little longer to get over acute illnesses as she gets a little older.  She is had a couple days where she had to use her inhaler 3 or 4 times but then yesterday did not have to use it at all.  She is noticed a little bit of wheezing in her upper chest.  For her left shoulder she wanted to know if we would be willing to refill the meloxicam previously prescribed by Dr. Norton Blizzard.  She thinks she may have tweaked it and its flared up a little bit.    ROS    Objective:     BP (!) 121/58   Pulse 87   Ht 5\' 6"  (1.676 m)   Wt 178 lb (80.7 kg)   SpO2 99%   BMI 28.73 kg/m    Physical Exam Vitals and nursing note reviewed.  Constitutional:      Appearance: She is well-developed.  HENT:     Head: Normocephalic and atraumatic.  Cardiovascular:     Rate and Rhythm: Normal rate and regular rhythm.     Heart sounds: Normal heart sounds.  Pulmonary:     Effort: Pulmonary effort is normal.     Breath sounds: Normal breath sounds.  Skin:    General: Skin is warm and dry.  Neurological:     Mental Status: She is alert and oriented to person, place, and time.  Psychiatric:        Behavior: Behavior normal.      No results found for any visits on 04/06/23.    The 10-year ASCVD risk score (Arnett DK, et al., 2019) is: 0.9%    Assessment & Plan:   Problem List  Items Addressed This Visit       Cardiovascular and Mediastinum   Primary hypertension - Primary    Well controlled. Continue current regimen. Follow up in  89mo       Relevant Orders   Basic Metabolic Panel (BMET)     Respiratory   Asthma    Mod intermittant.  Discussed if still using rescue inhaler then we may need to put her on daily inhaler for next 1-2 months.       Relevant Medications   albuterol (VENTOLIN HFA) 108 (90 Base) MCG/ACT inhaler     Musculoskeletal and Integument   Rotator cuff tendinitis, left    Scan refilled.  Can refer to PT if not improving.      Relevant Medications   meloxicam (MOBIC) 15 MG tablet     Other   ANXIETY DISORDER, GENERALIZED    Happy with current regimen.  Continue sertraline.  Work has been stressful of their been a lot of changes recently.      Relevant Medications   clonazePAM (KLONOPIN) 0.25 MG disintegrating tablet  Return in about 6 months (around 10/07/2023) for Hypertension, Asthma .    Nani Gasser, MD

## 2023-04-06 NOTE — Assessment & Plan Note (Signed)
Well controlled. Continue current regimen. Follow up in  6 mo  

## 2023-04-06 NOTE — Assessment & Plan Note (Signed)
Scan refilled.  Can refer to PT if not improving.

## 2023-04-06 NOTE — Assessment & Plan Note (Addendum)
Happy with current regimen.  Continue sertraline.  Work has been stressful of their been a lot of changes recently.

## 2023-04-06 NOTE — Assessment & Plan Note (Signed)
Mod intermittant.  Discussed if still using rescue inhaler then we may need to put her on daily inhaler for next 1-2 months.

## 2023-04-07 LAB — BASIC METABOLIC PANEL
BUN/Creatinine Ratio: 14 (ref 9–23)
BUN: 11 mg/dL (ref 6–24)
CO2: 24 mmol/L (ref 20–29)
Calcium: 9.2 mg/dL (ref 8.7–10.2)
Chloride: 102 mmol/L (ref 96–106)
Creatinine, Ser: 0.77 mg/dL (ref 0.57–1.00)
Glucose: 84 mg/dL (ref 70–99)
Potassium: 3.9 mmol/L (ref 3.5–5.2)
Sodium: 138 mmol/L (ref 134–144)
eGFR: 95 mL/min/{1.73_m2} (ref >59)

## 2023-04-07 NOTE — Progress Notes (Signed)
Your lab work is within acceptable range and there are no concerning findings.   ?

## 2023-05-12 ENCOUNTER — Encounter: Payer: Self-pay | Admitting: Family Medicine

## 2023-05-25 ENCOUNTER — Telehealth: Payer: Commercial Managed Care - PPO

## 2023-05-25 ENCOUNTER — Telehealth: Payer: Commercial Managed Care - PPO | Admitting: Physician Assistant

## 2023-05-25 ENCOUNTER — Other Ambulatory Visit (HOSPITAL_BASED_OUTPATIENT_CLINIC_OR_DEPARTMENT_OTHER): Payer: Self-pay

## 2023-05-25 DIAGNOSIS — U071 COVID-19: Secondary | ICD-10-CM | POA: Diagnosis not present

## 2023-05-25 MED ORDER — NIRMATRELVIR/RITONAVIR (PAXLOVID)TABLET
3.0000 | ORAL_TABLET | Freq: Two times a day (BID) | ORAL | 0 refills | Status: AC
Start: 2023-05-25 — End: 2023-05-30
  Filled 2023-05-25: qty 30, 5d supply, fill #0

## 2023-05-25 NOTE — Progress Notes (Signed)
Virtual Visit Consent   ASAKO ALLEGRO, you are scheduled for a virtual visit with a [facility] provider today. Just as with appointments in the office, your consent must be obtained to participate. Your consent will be active for this visit and any virtual visit you may have with one of our providers in the next 365 days. If you have a MyChart account, a copy of this consent can be sent to you electronically.  As this is a virtual visit, video technology does not allow for your provider to perform a traditional examination. This may limit your provider's ability to fully assess your condition. If your provider identifies any concerns that need to be evaluated in person or the need to arrange testing (such as labs, EKG, etc.), we will make arrangements to do so. Although advances in technology are sophisticated, we cannot ensure that it will always work on either your end or our end. If the connection with a video visit is poor, the visit may have to be switched to a telephone visit. With either a video or telephone visit, we are not always able to ensure that we have a secure connection.  By engaging in this virtual visit, you consent to the provision of healthcare and authorize for your insurance to be billed (if applicable) for the services provided during this visit. Depending on your insurance coverage, you may receive a charge related to this service.  I need to obtain your verbal consent now. Are you willing to proceed with your visit today? Sue [name] has provided verbal consent on 05/25/2023 for a virtual visit (video or telephone). Sue Loveless, PA-C  Date: 05/25/2023 12:32 PM  Virtual Visit via Video Note   I, Sue [name], connected with  Sue [name]  ([id number], 03-Apr-1971) on 05/25/23 at 12:30 PM EDT by a video-enabled telemedicine application and verified that I am speaking with the correct person using two identifiers.  Location: Patient: Virtual Visit  Location Patient: Home Provider: Virtual Visit Location Provider: Home Office   I discussed the limitations of evaluation and management by telemedicine and the availability of in person appointments. The patient expressed understanding and agreed to proceed.    History of Present Illness: Sue [name] is a 52 y.o. who identifies as a female who was assigned female at birth, and is being seen today for Covid 75.  HPI: URI  This is a new problem. The current episode started in the past 7 days (Tested positive for Covid 19 today; Symptoms started 2 days ago). The problem has been gradually worsening. There has been no fever. Associated symptoms include congestion, coughing (from post nasal drainage), ear pain (this morning, improved now), headaches, a plugged ear sensation, rhinorrhea, sinus pain and a sore throat. Pertinent negatives include no chest pain, diarrhea, nausea, sneezing, vomiting or wheezing. Associated symptoms comments: Mild myalgias . She has tried acetaminophen for the symptoms. The treatment provided no relief.     Problems:  Patient Active Problem List   Diagnosis Date Noted   Rotator cuff tendinitis, left 09/07/2022   Exercise-induced shortness of breath 08/14/2022   Primary hypertension 08/13/2022   Basal cell carcinoma of skin 08/16/2018   Bilateral hearing loss 08/16/2018   Low back pain 08/16/2015   Cyst of left ovary 12/26/2011   Menses, irregular 09/17/2010   ANXIETY DISORDER, GENERALIZED 07/30/2009   Asthma 11/12/2008    Allergies: No Known Allergies Medications:  Current Outpatient Medications:    nirmatrelvir/ritonavir (PAXLOVID) 20  x 150 MG & 10 x 100MG  TABS, Take 3 tablets by mouth 2 (two) times daily for 5 days. (Take nirmatrelvir 150 mg two tablets twice daily for 5 days and ritonavir 100 mg one tablet twice daily for 5 days) Patient GFR is 95, Disp: 30 tablet, Rfl: 0   albuterol (VENTOLIN HFA) 108 (90 Base) MCG/ACT inhaler, Inhale 1-2 puffs into  the lungs every 6 hours as needed for wheezing or shortness of breath., Disp: 6.7 g, Rfl: PRN   clonazePAM (KLONOPIN) 0.25 MG disintegrating tablet, Dissolve 1 tablet (0.25 mg total) in mouth daily as needed., Disp: 10 tablet, Rfl: 0   fluticasone (FLONASE) 50 MCG/ACT nasal spray, Place 2 sprays into both nostrils daily., Disp: 16 g, Rfl: 11   ketoconazole (NIZORAL) 2 % cream, Apply 1 Application topically behind right ear daily as needed., Disp: 45 g, Rfl: 1   losartan-hydrochlorothiazide (HYZAAR) 50-12.5 MG tablet, Take 1 tablet by mouth daily., Disp: 90 tablet, Rfl: 3   meloxicam (MOBIC) 15 MG tablet, Take 1 tablet (15 mg total) by mouth daily for shoulder pain, Disp: 30 tablet, Rfl: 1   propranolol (INDERAL) 20 MG tablet, Take 1 tablet (20 mg total) by mouth 3 (three) times daily as needed., Disp: 30 tablet, Rfl: 0   sertraline (ZOLOFT) 100 MG tablet, TAKE 1 TABLET (100 MG TOTAL) BY MOUTH DAILY., Disp: 90 tablet, Rfl: 3  Observations/Objective: Patient is well-developed, well-nourished in no acute distress.  Resting comfortably at home.  Head is normocephalic, atraumatic.  No labored breathing.  Speech is clear and coherent with logical content.  Patient is alert and oriented at baseline.    Assessment and Plan: 1. COVID-19 - MyChart COVID-19 home monitoring program; Future - nirmatrelvir/ritonavir (PAXLOVID) 20 x 150 MG & 10 x 100MG  TABS; Take 3 tablets by mouth 2 (two) times daily for 5 days. (Take nirmatrelvir 150 mg two tablets twice daily for 5 days and ritonavir 100 mg one tablet twice daily for 5 days) Patient GFR is 95  Dispense: 30 tablet; Refill: 0  - Continue OTC symptomatic management of choice - Will send OTC vitamins and supplement information through AV[facility] - Paxlovid prescribed - Patient enrolled in MyChart symptom monitoring - Push fluids - Rest as needed - Discussed return precautions and when to seek in-person evaluation, sent via AV[facility] as well   Follow Up  Instructions: I discussed the assessment and treatment plan with the patient. The patient was provided an opportunity to ask questions and all were answered. The patient agreed with the plan and demonstrated an understanding of the instructions.  A copy of instructions were sent to the patient via MyChart unless otherwise noted below.    The patient was advised to call back or seek an in-person evaluation if the symptoms worsen or if the condition fails to improve as anticipated.  Time:  I spent 15 minutes with the patient via telehealth technology discussing the above problems/concerns.    Sue Loveless, PA-C

## 2023-05-25 NOTE — Patient Instructions (Signed)
Kendell Bane, thank you for joining [name], PA-C for today's virtual visit.  While this provider is not your primary care provider (PCP), if your PCP is located in our provider database this encounter information will be shared with them immediately following your visit.   A [facility] MyChart account gives you access to today's visit and all your visits, tests, and labs performed at [facility] " click here if you don't have a [facility] MyChart account or go to [email or web address]  Consent: (Patient) Sue [name] provided verbal consent for this virtual visit at the beginning of the encounter.  Current Medications:  Current Outpatient Medications:    nirmatrelvir/ritonavir (PAXLOVID) 20 x 150 MG & 10 x 100MG  TABS, Take 3 tablets by mouth 2 (two) times daily for 5 days. (Take nirmatrelvir 150 mg two tablets twice daily for 5 days and ritonavir 100 mg one tablet twice daily for 5 days) Patient GFR is 95, Disp: 30 tablet, Rfl: 0   albuterol (VENTOLIN HFA) 108 (90 Base) MCG/ACT inhaler, Inhale 1-2 puffs into the lungs every 6 hours as needed for wheezing or shortness of breath., Disp: 6.7 g, Rfl: PRN   clonazePAM (KLONOPIN) 0.25 MG disintegrating tablet, Dissolve 1 tablet (0.25 mg total) in mouth daily as needed., Disp: 10 tablet, Rfl: 0   fluticasone (FLONASE) 50 MCG/ACT nasal spray, Place 2 sprays into both nostrils daily., Disp: 16 g, Rfl: 11   ketoconazole (NIZORAL) 2 % cream, Apply 1 Application topically behind right ear daily as needed., Disp: 45 g, Rfl: 1   losartan-hydrochlorothiazide (HYZAAR) 50-12.5 MG tablet, Take 1 tablet by mouth daily., Disp: 90 tablet, Rfl: 3   meloxicam (MOBIC) 15 MG tablet, Take 1 tablet (15 mg total) by mouth daily for shoulder pain, Disp: 30 tablet, Rfl: 1   propranolol (INDERAL) 20 MG tablet, Take 1 tablet (20 mg total) by mouth 3 (three) times daily as needed., Disp: 30 tablet, Rfl: 0   sertraline (ZOLOFT) 100 MG  tablet, TAKE 1 TABLET (100 MG TOTAL) BY MOUTH DAILY., Disp: 90 tablet, Rfl: 3   Medications ordered in this encounter:  Meds ordered this encounter  Medications   nirmatrelvir/ritonavir (PAXLOVID) 20 x 150 MG & 10 x 100MG  TABS    Sig: Take 3 tablets by mouth 2 (two) times daily for 5 days. (Take nirmatrelvir 150 mg two tablets twice daily for 5 days and ritonavir 100 mg one tablet twice daily for 5 days) Patient GFR is 95    Dispense:  30 tablet    Refill:  0    Order Specific Question:   Supervising Provider    Answer:   Merrilee Jansky [id number]     *If you need refills on other medications prior to your next appointment, please contact your pharmacy*  Follow-Up: Call back or seek an in-person evaluation if the symptoms worsen or if the condition fails to improve as anticipated.  [facility] [phone]  Care Instructions:  Paxlovid (Nirmatrelvir; Ritonavir) Tablets What is this medication? NIRMATRELVIR; RITONAVIR (NIR ma TREL vir; ri TOE na veer) treats mild to moderate COVID-19. It may help people who are at high risk of developing severe illness. It works by limiting the spread of the virus in your body. This medicine may be used for other purposes; ask your health care provider or pharmacist if you have questions. COMMON BRAND NAME(S): PAXLOVID What should I tell my care team before I take this medication? They need to know  if you have any of these conditions: Any allergies Any serious illness Kidney disease Liver disease An unusual or allergic reaction to nirmatrelvir, ritonavir, other medications, foods, dyes, or preservatives Pregnant or trying to get pregnant Breast-feeding How should I use this medication? This product contains 2 different medications that are packaged together. For the standard dose, take 2 pink tablets of nirmatrelvir with 1 white tablet of ritonavir (3 tablets total) by mouth with water twice daily. Talk to your care team if you  have kidney disease. You may need a different dose. Swallow the tablets whole. You can take it with or without food. If it upsets your stomach, take it with food. Take all of this medication unless your care team tells you to stop it early. Keep taking it even if you think you are better. Talk to your care team about the use of this medication in children. While it may be prescribed for children as young as 12 years for selected conditions, precautions do apply. Overdosage: If you think you have taken too much of this medicine contact a poison control center or emergency room at once. NOTE: This medicine is only for you. Do not share this medicine with others. What if I miss a dose? If you miss a dose, take it as soon as you can unless it is more than 8 hours late. If it is more than 8 hours late, skip the missed dose. Take the next dose at the normal time. Do not take extra or 2 doses at the same time to make up for the missed dose. What may interact with this medication? Do not take this medication with any of the following: Alfuzosin Certain medications for anxiety or sleep, such as midazolam or triazolam Certain medications for cancer, such as apalutamide Certain medications for cholesterol, such as lovastatin or simvastatin Certain medications for irregular heartbeat, such as amiodarone, dronedarone, flecainide, propafenone, quinidine Certain medications for mental health conditions, such as lurasidone or pimozide Certain medications for seizures, such as carbamazepine, phenobarbital, phenytoin, primidone Colchicine Eletriptan Eplerenone Ergot alkaloids, such as dihydroergotamine, ergotamine, methylergonovine Finerenone Flibanserin Ivabradine Lomitapide Lumacaftor; ivacaftor Naloxegol Ranolazine Red Yeast Rice Rifampin Rifapentine Sildenafil Silodosin St. John's wort Tolvaptan Ubrogepant Voclosporin This medication may affect how other medications work, and other medications  may affect the way this medication works. Talk with your care team about all of the medications you take. They may suggest changes to your treatment plan to lower the risk of side effects and to make sure your medications work as intended. This list may not describe all possible interactions. Give your health care provider a list of all the medicines, herbs, non-prescription drugs, or dietary supplements you use. Also tell them if you smoke, drink alcohol, or use illegal drugs. Some items may interact with your medicine. What should I watch for while using this medication? Your condition will be monitored carefully while you are receiving this medication. Visit your care team for regular checkups. Tell your care team if your symptoms do not start to get better or if they get worse. If you have untreated HIV infection, this medication may lead to some HIV medications not working as well in the future. Estrogen and progestin hormones may not work as well while you are taking this medication. Your care team can help you find the contraceptive option that works for you. What side effects may I notice from receiving this medication? Side effects that you should report to your care team as soon as  possible: Allergic reactions--skin rash, itching, hives, swelling of the face, lips, tongue, or throat Liver injury--right upper belly pain, loss of appetite, nausea, light-colored stool, dark yellow or brown urine, yellowing skin or eyes, unusual weakness or fatigue Redness, blistering, peeling, or loosening of the skin, including inside the mouth Side effects that usually do not require medical attention (report these to your care team if they continue or are bothersome): Change in taste Diarrhea General discomfort and fatigue Increase in blood pressure Muscle pain Nausea Stomach pain This list may not describe all possible side effects. Call your doctor for medical advice about side effects. You may report  side effects to FDA at 1-800-FDA-1088. Where should I keep my medication? Keep out of the reach of children and pets. Store at room temperature between 20 and 25 degrees C (68 and 77 degrees F). Get rid of any unused medication after the expiration date. To get rid of medications that are no longer needed or have expired: Take the medication to a medication take-back program. Check with your pharmacy or law enforcement to find a location. If you cannot return the medication, check the label or package insert to see if the medication should be thrown out in the garbage or flushed down the toilet. If you are not sure, ask your care team. If it is safe to put it in the trash, take the medication out of the container. Mix the medication with cat litter, dirt, coffee grounds, or other unwanted substance. Seal the mixture in a bag or container. Put it in the trash. NOTE: This sheet is a summary. It may not cover all possible information. If you have questions about this medicine, talk to your doctor, pharmacist, or health care provider.  2024 Elsevier/Gold Standard (2023-01-17 00:00:00)    Isolation Instructions: You are to isolate at home until you have been fever free for at least 24 hours without a fever-reducing medication, and symptoms have been steadily improving for 24 hours. At that time,  you can end isolation but need to mask for an additional 5 days.   If you must be around other household members who do not have symptoms, you need to make sure that both you and the family members are masking consistently with a high-quality mask.  If you note any worsening of symptoms despite treatment, please seek an in-person evaluation ASAP. If you note any significant shortness of breath or any chest pain, please seek ER evaluation. Please do not delay care!   COVID-19: What to Do if You Are Sick If you test positive and are an older adult or someone who is at high risk of getting very sick from  COVID-19, treatment may be available. Contact a healthcare provider right away after a positive test to determine if you are eligible, even if your symptoms are mild right now. You can also visit a Test to Treat location and, if eligible, receive a prescription from a provider. Don't delay: Treatment must be started within the first few days to be effective. If you have a fever, cough, or other symptoms, you might have COVID-19. Most people have mild illness and are able to recover at home. If you are sick: Keep track of your symptoms. If you have an emergency warning sign (including trouble breathing), call 911. Steps to help prevent the spread of COVID-19 if you are sick If you are sick with COVID-19 or think you might have COVID-19, follow the steps below to care for yourself and to  help protect other people in your home and community. Stay home except to get medical care Stay home. Most people with COVID-19 have mild illness and can recover at home without medical care. Do not leave your home, except to get medical care. Do not visit public areas and do not go to places where you are unable to wear a mask. Take care of yourself. Get rest and stay hydrated. Take over-the-counter medicines, such as acetaminophen, to help you feel better. Stay in touch with your doctor. Call before you get medical care. Be sure to get care if you have trouble breathing, or have any other emergency warning signs, or if you think it is an emergency. Avoid public transportation, ride-sharing, or taxis if possible. Get tested If you have symptoms of COVID-19, get tested. While waiting for test results, stay away from others, including staying apart from those living in your household. Get tested as soon as possible after your symptoms start. Treatments may be available for people with COVID-19 who are at risk for becoming very sick. Don't delay: Treatment must be started early to be effective--some treatments must begin  within 5 days of your first symptoms. Contact your healthcare provider right away if your test result is positive to determine if you are eligible. Self-tests are one of several options for testing for the virus that causes COVID-19 and may be more convenient than laboratory-based tests and point-of-care tests. Ask your healthcare provider or your local health department if you need help interpreting your test results. You can visit your state, tribal, local, and territorial health department's website to look for the latest local information on testing sites. Separate yourself from other people As much as possible, stay in a specific room and away from other people and pets in your home. If possible, you should use a separate bathroom. If you need to be around other people or animals in or outside of the home, wear a well-fitting mask. Tell your close contacts that they may have been exposed to COVID-19. An infected person can spread COVID-19 starting 48 hours (or 2 days) before the person has any symptoms or tests positive. By letting your close contacts know they may have been exposed to COVID-19, you are helping to protect everyone. See COVID-19 and Animals if you have questions about pets. If you are diagnosed with COVID-19, someone from the health department may call you. Answer the call to slow the spread. Monitor your symptoms Symptoms of COVID-19 include fever, cough, or other symptoms. Follow care instructions from your healthcare provider and local health department. Your local health authorities may give instructions on checking your symptoms and reporting information. When to seek emergency medical attention Look for emergency warning signs* for COVID-19. If someone is showing any of these signs, seek emergency medical care immediately: Trouble breathing Persistent pain or pressure in the chest New confusion Inability to wake or stay awake Pale, gray, or blue-colored skin, lips, or nail  beds, depending on skin tone *This list is not all possible symptoms. Please call your medical provider for any other symptoms that are severe or concerning to you. Call 911 or call ahead to your local emergency facility: Notify the operator that you are seeking care for someone who has or may have COVID-19. Call ahead before visiting your doctor Call ahead. Many medical visits for routine care are being postponed or done by phone or telemedicine. If you have a medical appointment that cannot be postponed, call your doctor's office, and  tell them you have or may have COVID-19. This will help the office protect themselves and other patients. If you are sick, wear a well-fitting mask You should wear a mask if you must be around other people or animals, including pets (even at home). Wear a mask with the best fit, protection, and comfort for you. You don't need to wear the mask if you are alone. If you can't put on a mask (because of trouble breathing, for example), cover your coughs and sneezes in some other way. Try to stay at least 6 feet away from other people. This will help protect the people around you. Masks should not be placed on young children under age 34 years, anyone who has trouble breathing, or anyone who is not able to remove the mask without help. Cover your coughs and sneezes Cover your mouth and nose with a tissue when you cough or sneeze. Throw away used tissues in a lined trash can. Immediately wash your hands with soap and water for at least 20 seconds. If soap and water are not available, clean your hands with an alcohol-based hand sanitizer that contains at least 60% alcohol. Clean your hands often Wash your hands often with soap and water for at least 20 seconds. This is especially important after blowing your nose, coughing, or sneezing; going to the bathroom; and before eating or preparing food. Use hand sanitizer if soap and water are not available. Use an alcohol-based hand  sanitizer with at least 60% alcohol, covering all surfaces of your hands and rubbing them together until they feel dry. Soap and water are the best option, especially if hands are visibly dirty. Avoid touching your eyes, nose, and mouth with unwashed hands. Handwashing Tips Avoid sharing personal household items Do not share dishes, drinking glasses, cups, eating utensils, towels, or bedding with other people in your home. Wash these items thoroughly after using them with soap and water or put in the dishwasher. Clean surfaces in your home regularly Clean and disinfect high-touch surfaces (for example, doorknobs, tables, handles, light switches, and countertops) in your "sick room" and bathroom. In shared spaces, you should clean and disinfect surfaces and items after each use by the person who is ill. If you are sick and cannot clean, a caregiver or other person should only clean and disinfect the area around you (such as your bedroom and bathroom) on an as needed basis. Your caregiver/other person should wait as long as possible (at least several hours) and wear a mask before entering, cleaning, and disinfecting shared spaces that you use. Clean and disinfect areas that may have blood, stool, or body fluids on them. Use household cleaners and disinfectants. Clean visible dirty surfaces with household cleaners containing soap or detergent. Then, use a household disinfectant. Use a product from [employer] List N: Disinfectants for Coronavirus (COVID-19). Be sure to follow the instructions on the label to ensure safe and effective use of the product. Many products recommend keeping the surface wet with a disinfectant for a certain period of time (look at "contact time" on the product label). You may also need to wear personal protective equipment, such as gloves, depending on the directions on the product label. Immediately after disinfecting, wash your hands with soap and water for 20 seconds. For  completed guidance on cleaning and disinfecting your home, visit Complete Disinfection Guidance. Take steps to improve ventilation at home Improve ventilation (air flow) at home to help prevent from spreading COVID-19 to other people in your  household. Clear out COVID-19 virus particles in the air by opening windows, using air filters, and turning on fans in your home. Use this interactive tool to learn how to improve air flow in your home. When you can be around others after being sick with COVID-19 Deciding when you can be around others is different for different situations. Find out when you can safely end home isolation. For any additional questions about your care, contact your healthcare provider or state or local health department. 02/11/2021 Content source: [facility] for Immunization and Respiratory Diseases ([facility]), Division of Viral Diseases This information is not intended to replace advice given to you by your health care provider. Make sure you discuss any questions you have with your health care provider. Document Revised: 03/27/2021 Document Reviewed: 03/27/2021 Elsevier Patient Education  2022 [employer].     If you have been instructed to have an in-person evaluation today at a local Urgent Care facility, please use the link below. It will take you to a list of all of our available [facility], including address, phone number and hours of operation. Please do not delay care.  [facility]  If you or a family member do not have a primary care provider, use the link below to schedule a visit and establish care. When you choose a [facility] primary care physician or advanced practice provider, you gain a long-term partner in health. Find a Primary Care Provider  Learn more about [facility]'s in-office and virtual care options:  - Get Care Now

## 2023-06-05 ENCOUNTER — Encounter (INDEPENDENT_AMBULATORY_CARE_PROVIDER_SITE_OTHER): Payer: Self-pay

## 2023-06-07 ENCOUNTER — Encounter: Payer: Self-pay | Admitting: Family Medicine

## 2023-06-07 DIAGNOSIS — M7582 Other shoulder lesions, left shoulder: Secondary | ICD-10-CM

## 2023-06-10 ENCOUNTER — Ambulatory Visit: Payer: Commercial Managed Care - PPO | Attending: Family Medicine | Admitting: Physical Therapy

## 2023-06-10 ENCOUNTER — Other Ambulatory Visit: Payer: Self-pay

## 2023-06-10 ENCOUNTER — Encounter: Payer: Self-pay | Admitting: Physical Therapy

## 2023-06-10 DIAGNOSIS — M542 Cervicalgia: Secondary | ICD-10-CM | POA: Diagnosis not present

## 2023-06-10 DIAGNOSIS — M25512 Pain in left shoulder: Secondary | ICD-10-CM | POA: Diagnosis not present

## 2023-06-10 DIAGNOSIS — M6281 Muscle weakness (generalized): Secondary | ICD-10-CM | POA: Diagnosis not present

## 2023-06-10 DIAGNOSIS — G8929 Other chronic pain: Secondary | ICD-10-CM | POA: Diagnosis not present

## 2023-06-10 DIAGNOSIS — M7582 Other shoulder lesions, left shoulder: Secondary | ICD-10-CM | POA: Diagnosis not present

## 2023-06-10 NOTE — Therapy (Signed)
OUTPATIENT PHYSICAL THERAPY SHOULDER EVALUATION   Patient Name: Sue [name] MRN: [id number] DOB:01/29/1971, 52 y.o., female Today's Date: 06/10/2023   PT End of Session - 06/10/23 1105     Visit Number 1    Number of Visits --   1-2x/week   Date for PT Re-Evaluation 08/05/23    Authorization Type [place] [employer] - FOTO    PT Start Time 1001    PT Stop Time 1043    PT Time Calculation (min) 42 min             Past Medical History:  Diagnosis Date   Anxiety    Asthma    Basal cell carcinoma of skin of face 2007   Dr. Emily Filbert   Past Surgical History:  Procedure Laterality Date   MOHS SURGERY     removal of basal cell carcinoma   Patient Active Problem List   Diagnosis Date Noted   Rotator cuff tendinitis, left 09/07/2022   Exercise-induced shortness of breath 08/14/2022   Primary hypertension 08/13/2022   Basal cell carcinoma of skin 08/16/2018   Bilateral hearing loss 08/16/2018   Low back pain 08/16/2015   Cyst of left ovary 12/26/2011   Menses, irregular 09/17/2010   ANXIETY DISORDER, GENERALIZED 07/30/2009   Asthma 11/12/2008    PCP: Agapito Games, MD  REFERRING PROVIDER: Agapito Games, *  THERAPY DIAG:  Chronic left shoulder pain - Plan: PT plan of care cert/re-cert  Cervicalgia - Plan: PT plan of care cert/re-cert  Muscle weakness - Plan: PT plan of care cert/re-cert  REFERRING DIAG: Rotator cuff tendinitis, left [M75.82]   Rationale for Evaluation and Treatment:  Rehabilitation  SUBJECTIVE:  PERTINENT PAST HISTORY:  Anxiety         PRECAUTIONS: None  WEIGHT BEARING RESTRICTIONS No  FALLS:  Has patient fallen in last 6 months? No, Number of falls: 0  MOI/History of condition:  Onset date: >1 year  SUBJECTIVE STATEMENT  Sue [name] is a 52 y.o. female who presents to clinic with chief complaint of L shoulder and neck pain which has been going on for >1 year.  No known acute trauma on injury.  She has a  considerable popping and grinding feeling in the L shoulder with movement.  She also feels she carries quite a bit of tension in her neck (L UT and scalenes).  Denies n/t or shooting pain down the arm.  She feels that the neck and shoulder pain is connected connected.    Red flags:  denies   Pain:  Are you having pain? Yes Pain location: L UT, diffuse L shoulder, L side of neck NPRS scale:  2/10 to 4/10 Aggravating factors: OH movement Relieving factors: Neck massager Pain description: dull and aching Stage: Chronic 24 hour pattern: worse at the end of the day   Occupation: desk job  Assistive Device: NA  Hand Dominance: L  Patient Goals/Specific Activities: improve ROM and improve strength   OBJECTIVE:    DIAGNOSTIC FINDINGS:  None recent  GENERAL OBSERVATION:  Forward head, rounded shoulders     SENSATION:  Light touch: Appears intact   PALPATION: Popping L biceps tendon with shoulder movement  Cervical ROM  ROM ROM  (Eval)  Flexion n  Extension 30* neck  Right lateral flexion 40  Left lateral flexion 30* neck  Right rotation n  Left rotation Slight limitation with neck pain  Flexion rotation (normal is 30 degrees)   Flexion rotation (normal is 30  degrees)     (Blank rows = not tested, N = WNL, * = concordant pain)  UPPER EXTREMITY AROM:  ROM Right (Eval) Left (Eval)  Shoulder flexion 140 120*  Shoulder abduction 140 120*  Shoulder internal rotation    Shoulder external rotation    Functional IR T10 L1*  Functional ER T7 T7  Shoulder extension    Elbow extension    Elbow flexion     (Blank rows = not tested, N = WNL, * = concordant pain with testing)  UPPER EXTREMITY MMT:  MMT Right (Eval) Left (Eval)  Shoulder flexion 4 4*  Shoulder abduction (C5) 4 4*  Shoulder ER 4+ 4*  Shoulder IR 4+ 4*  Middle trapezius    Lower trapezius    Shoulder extension    Grip strength    Cervical flexion (C1,C2)    Cervical S/B (C3)    Shoulder  shrug (C4)    Elbow flexion (C6)  4+ no pain   Elbow ext (C7)    Thumb ext (C8)    Finger abd (T1)    Grossly     (Blank rows = not tested, score listed is out of 5 possible points.  N = WNL, D = diminished, C = clear for gross weakness with myotome testing, * = concordant pain with testing)   MUSCLE LENGTH:    Pec Major: L (+), R (+) for restriction  UPPER EXTREMITY PROM:  PROM Right (Eval) Left (Eval)  Shoulder flexion    Shoulder abduction    Shoulder internal rotation    Shoulder external rotation    Functional IR    Functional ER    Shoulder extension    Elbow extension    Elbow flexion     (Blank rows = not tested, N = WNL, * = concordant pain with testing)  SPECIAL TESTS:  SAPS test cluster: (+)  Obrien's test (+)  PATIENT SURVEYS:  FOTO 54 -> 68    TODAY'S TREATMENT:  Creating, reviewing, and completing below HEP   PATIENT EDUCATION:  POC, diagnosis, prognosis, HEP, and outcome measures.  Pt educated via explanation, demonstration, and handout (HEP).  Pt confirms understanding verbally.   HOME EXERCISE PROGRAM: Access Code: [email or web address] URL: [email or web address] Date: 06/10/2023 Prepared by: Alphonzo Severance  Exercises - Shoulder External Rotation Reactive Isometrics  - 1 x daily - 7 x weekly - 3 sets - 10 reps - Seated Assisted Cervical Rotation with Towel  - 3 x daily - 7 x weekly - 2 sets - 15 reps - Cervical Extension AROM with Strap  - 1 x daily - 7 x weekly - 2 sets - 10 reps - Seated Passive Cervical Retraction  - 3-5 x daily - 7 x weekly - 2 sets - 10 reps - 5 second hold hold  Treatment priorities   Eval        R/C strengthening        Manual for UT and Cx spine (TDN?)                                  ASSESSMENT:  CLINICAL IMPRESSION: [name] is a 52 y.o. female who presents to clinic with signs and sxs consistent with L neck and shoulder pain.  DD considered includes mechanical neck pain, SAPS, cervical radiculopathy,  biceps tendonitis, labral pathology.  Ruling down radiculopathy with no n/t or shooting pain in L UE or periscapular region and  no reproduction of distal pain with cervical movements.  Ruling up mechanical neck pain with limited ROM with local pain.  Ruling down biceps tendonitis with (-) TTP and resisted biceps MMT, although LH of biceps does feel like it is subluxing from bicipital grove with shoulder movements (this is not accompanied by local pain).  Ruling up some element of SAPS with positive test cluster but unable to uncouple this from possible labral pathology.  Ruling up possible labral pathology with significant popping and shift with shoulder movement, (+) special testing, and reduced horizontal abd ROM.  OBJECTIVE IMPAIRMENTS: Pain, shoulder ROM, shoulder strength, cervical ROM  ACTIVITY LIMITATIONS: reaching, lifting, working  PERSONAL FACTORS: See medical history and pertinent history   REHAB POTENTIAL: Good  CLINICAL DECISION MAKING: Stable/uncomplicated  EVALUATION COMPLEXITY: Low   GOALS:   SHORT TERM GOALS: Target date: 07/08/2023   Shanzay will be >75% HEP compliant to improve carryover between sessions and facilitate independent management of condition  Evaluation: ongoing Goal status: INITIAL   LONG TERM GOALS: Target date: 08/05/2023   Rayelle will improve FOTO score to 68 as a proxy for functional improvement  Evaluation/Baseline: 54 Goal status: INITIAL    2.  Oneika will self report >/= 50% decrease in shoulder pain from evaluation   Evaluation/Baseline: 4/10 max pain Goal status: INITIAL   3.  Edwardine will report confidence in self management of condition at time of discharge with advanced HEP  Evaluation/Baseline: unable to self manage Goal status: INITIAL    4.  Arryanna will improve the following MMTs to >/= 5/5 to show improvement in strength:     Evaluation/Baseline:   UPPER EXTREMITY MMT:  MMT Right (Eval) Left (Eval)  Shoulder  flexion 4 4*  Shoulder abduction (C5) 4 4*  Shoulder ER 4+ 4  Shoulder IR 4+ 4*  Middle trapezius    Lower trapezius    Shoulder extension    Grip strength    Cervical flexion (C1,C2)    Cervical S/B (C3)    Shoulder shrug (C4)    Elbow flexion (C6)  4+ no pain   Elbow ext (C7)    Thumb ext (C8)    Finger abd (T1)    Grossly     (Blank rows = not tested, score listed is out of 5 possible points.  N = WNL, D = diminished, C = clear for gross weakness with myotome testing, * = concordant pain with testing)  Goal status: INITIAL     PLAN: PT FREQUENCY: 1-2x/week  PT DURATION: 8 weeks  PLANNED INTERVENTIONS: Therapeutic exercises, Aquatic therapy, Therapeutic activity, Neuro Muscular re-education, Gait training, Patient/Family education, Joint mobilization, Dry Needling, Electrical stimulation, Spinal mobilization and/or manipulation, Moist heat, Taping, Vasopneumatic device, Ionotophoresis 4mg /ml Dexamethasone, and Manual therapy   Alphonzo Severance PT, DPT 06/10/2023, 11:52 AM

## 2023-06-16 ENCOUNTER — Ambulatory Visit: Payer: Commercial Managed Care - PPO

## 2023-06-16 DIAGNOSIS — M7582 Other shoulder lesions, left shoulder: Secondary | ICD-10-CM | POA: Diagnosis not present

## 2023-06-16 DIAGNOSIS — M542 Cervicalgia: Secondary | ICD-10-CM

## 2023-06-16 DIAGNOSIS — M6281 Muscle weakness (generalized): Secondary | ICD-10-CM

## 2023-06-16 DIAGNOSIS — G8929 Other chronic pain: Secondary | ICD-10-CM

## 2023-06-16 DIAGNOSIS — M25512 Pain in left shoulder: Secondary | ICD-10-CM | POA: Diagnosis not present

## 2023-06-16 NOTE — Therapy (Signed)
OUTPATIENT PHYSICAL THERAPY SHOULDER EVALUATION   Patient Name: Sue [name] MRN: [id number] DOB:[birth year], 52 y.o., female Today's Date: 06/16/2023   PT End of Session - 06/16/23 0830     Visit Number 2    Date for PT Re-Evaluation 08/05/23    Authorization Type [place] [employer] - FOTO    PT Start Time 0831    PT Stop Time 0911    PT Time Calculation (min) 40 min    Activity Tolerance Patient tolerated treatment well    Behavior During Therapy [facility] for tasks assessed/performed              Past Medical History:  Diagnosis Date   Anxiety    Asthma    Basal cell carcinoma of skin of face 2007   Dr. Emily Filbert   Past Surgical History:  Procedure Laterality Date   MOHS SURGERY     removal of basal cell carcinoma   Patient Active Problem List   Diagnosis Date Noted   Rotator cuff tendinitis, left 09/07/2022   Exercise-induced shortness of breath 08/14/2022   Primary hypertension 08/13/2022   Basal cell carcinoma of skin 08/16/2018   Bilateral hearing loss 08/16/2018   Low back pain 08/16/2015   Cyst of left ovary 12/26/2011   Menses, irregular 09/17/2010   ANXIETY DISORDER, GENERALIZED 07/30/2009   Asthma 11/12/2008    PCP: Agapito Games, MD  REFERRING PROVIDER: Agapito Games, *  THERAPY DIAG:  Chronic left shoulder pain  Cervicalgia  Muscle weakness  REFERRING DIAG: Rotator cuff tendinitis, left [M75.82]   Rationale for Evaluation and Treatment:  Rehabilitation  SUBJECTIVE:  PERTINENT PAST HISTORY:  Anxiety         PRECAUTIONS: None  WEIGHT BEARING RESTRICTIONS No  FALLS:  Has patient fallen in last 6 months? No, Number of falls: 0  MOI/History of condition:  Onset date: >1 year  SUBJECTIVE STATEMENT  Sue [name] has been mostly compliant with HEP and reporting positive response to her program. She has 3/10 pain this morning.     Red flags:  denies   Pain:  Are you having pain? Yes Pain location: L UT, diffuse L  shoulder, L side of neck NPRS scale:  2/10 to 4/10 Aggravating factors: OH movement Relieving factors: Neck massager Pain description: dull and aching Stage: Chronic 24 hour pattern: worse at the end of the day   Occupation: desk job  Assistive Device: NA  Hand Dominance: L  Patient Goals/Specific Activities: improve ROM and improve strength   OBJECTIVE:    DIAGNOSTIC FINDINGS:  None recent  GENERAL OBSERVATION:  Forward head, rounded shoulders     SENSATION:  Light touch: Appears intact   PALPATION: Popping L biceps tendon with shoulder movement  Cervical ROM  ROM ROM  (Eval)  Flexion n  Extension 30* neck  Right lateral flexion 40  Left lateral flexion 30* neck  Right rotation n  Left rotation Slight limitation with neck pain  Flexion rotation (normal is 30 degrees)   Flexion rotation (normal is 30 degrees)     (Blank rows = not tested, N = WNL, * = concordant pain)  UPPER EXTREMITY AROM:  ROM Right (Eval) Left (Eval)  Shoulder flexion 140 120*  Shoulder abduction 140 120*  Shoulder internal rotation    Shoulder external rotation    Functional IR T10 L1*  Functional ER T7 T7  Shoulder extension    Elbow extension    Elbow flexion     (Blank rows =  not tested, N = WNL, * = concordant pain with testing)  UPPER EXTREMITY MMT:  MMT Right (Eval) Left (Eval)  Shoulder flexion 4 4*  Shoulder abduction (C5) 4 4*  Shoulder ER 4+ 4*  Shoulder IR 4+ 4*  Middle trapezius    Lower trapezius    Shoulder extension    Grip strength    Cervical flexion (C1,C2)    Cervical S/B (C3)    Shoulder shrug (C4)    Elbow flexion (C6)  4+ no pain   Elbow ext (C7)    Thumb ext (C8)    Finger abd (T1)    Grossly     (Blank rows = not tested, score listed is out of 5 possible points.  N = WNL, D = diminished, C = clear for gross weakness with myotome testing, * = concordant pain with testing)   MUSCLE LENGTH:    Pec Major: L (+), R (+) for  restriction  UPPER EXTREMITY PROM:  PROM Right (Eval) Left (Eval)  Shoulder flexion    Shoulder abduction    Shoulder internal rotation    Shoulder external rotation    Functional IR    Functional ER    Shoulder extension    Elbow extension    Elbow flexion     (Blank rows = not tested, N = WNL, * = concordant pain with testing)  SPECIAL TESTS:  SAPS test cluster: (+)  Obrien's test (+)  PATIENT SURVEYS:  FOTO 54 -> 68    TODAY'S TREATMENT:   [facility] Adult PT Treatment:                                                DATE: 06/16/2023  Therapeutic Exercise: Cervical retraction, 5 sec hold x 20  Cervical snag extension, x 20 Scapular retraction, 3 sec x 15 Isometric shoulder flexion at wall, 5 sec x5 Isometric shoulder ABD at wall, 5 sec x5 Updated and reviewed home program   Manual Therapy: Long axis distraction, varying angles from 30-70 degrees abduction   Neuromuscular re-ed: Isometrics with clinician resistance 6 way (flex/ext/abd/add/extern, 3 x 90 sec  Isotonic PNF (repeated contraction) D1 flexion/ext & D2 flexion/ext, 2 x 6 each direction     PATIENT EDUCATION:  POC, diagnosis, prognosis, HEP, and outcome measures.  Pt educated via explanation, demonstration, and handout (HEP).  Pt confirms understanding verbally.   HOME EXERCISE PROGRAM: Access Code: [email or web address] URL: [email or web address] Date: 06/16/2023 Prepared by: Mauri Reading  Exercises - Seated Assisted Cervical Rotation with Towel  - 3 x daily - 7 x weekly - 2 sets - 15 reps - Cervical Extension AROM with Strap  - 1 x daily - 7 x weekly - 2 sets - 10 reps - Seated Passive Cervical Retraction  - 3-5 x daily - 7 x weekly - 2 sets - 10 reps - 5 second hold hold - Standing Isometric Shoulder External Rotation with Doorway  - 1 x daily - 7 x weekly - 2 sets - 10 reps - 5 sec hold - Isometric Shoulder Flexion at Wall  - 1 x daily - 7 x weekly - 2 sets - 10 reps - 5 sec hold  Treatment  priorities   Eval        R/C strengthening        Manual for UT and Cx spine (TDN?)  ASSESSMENT:  CLINICAL IMPRESSION: Sue [name] responded very well to initial treatment session today including shoulder isometrics and PNF for isotonic contraction. She did have some muscle fatigue at end of session, but no increase in pain. We will reassess response to treatment at next session and increase repetitions at appropriate.   OBJECTIVE IMPAIRMENTS: Pain, shoulder ROM, shoulder strength, cervical ROM  ACTIVITY LIMITATIONS: reaching, lifting, working  PERSONAL FACTORS: See medical history and pertinent history   REHAB POTENTIAL: Good  CLINICAL DECISION MAKING: Stable/uncomplicated  EVALUATION COMPLEXITY: Low   GOALS:   SHORT TERM GOALS: Target date: 07/08/2023   Corri will be >75% HEP compliant to improve carryover between sessions and facilitate independent management of condition  Evaluation: ongoing Goal status: INITIAL   LONG TERM GOALS: Target date: 08/05/2023   Serine will improve FOTO score to 68 as a proxy for functional improvement  Evaluation/Baseline: 54 Goal status: INITIAL    2.  Syble will self report >/= 50% decrease in shoulder pain from evaluation   Evaluation/Baseline: 4/10 max pain Goal status: INITIAL   3.  Keilyn will report confidence in self management of condition at time of discharge with advanced HEP  Evaluation/Baseline: unable to self manage Goal status: INITIAL    4.  Dezi will improve the following MMTs to >/= 5/5 to show improvement in strength:     Evaluation/Baseline:   UPPER EXTREMITY MMT:  MMT Right (Eval) Left (Eval)  Shoulder flexion 4 4*  Shoulder abduction (C5) 4 4*  Shoulder ER 4+ 4  Shoulder IR 4+ 4*  Middle trapezius    Lower trapezius    Shoulder extension    Grip strength    Cervical flexion (C1,C2)    Cervical S/B (C3)    Shoulder shrug (C4)    Elbow flexion  (C6)  4+ no pain   Elbow ext (C7)    Thumb ext (C8)    Finger abd (T1)    Grossly     (Blank rows = not tested, score listed is out of 5 possible points.  N = WNL, D = diminished, C = clear for gross weakness with myotome testing, * = concordant pain with testing)  Goal status: INITIAL     PLAN: PT FREQUENCY: 1-2x/week  PT DURATION: 8 weeks  PLANNED INTERVENTIONS: Therapeutic exercises, Aquatic therapy, Therapeutic activity, Neuro Muscular re-education, Gait training, Patient/Family education, Joint mobilization, Dry Needling, Electrical stimulation, Spinal mobilization and/or manipulation, Moist heat, Taping, Vasopneumatic device, Ionotophoresis 4mg /ml Dexamethasone, and Manual therapy   Mauri Reading PT, DPT 06/16/2023, 9:17 AM

## 2023-06-22 ENCOUNTER — Ambulatory Visit: Payer: Commercial Managed Care - PPO

## 2023-06-29 ENCOUNTER — Ambulatory Visit: Payer: Commercial Managed Care - PPO | Attending: Family Medicine

## 2023-06-29 DIAGNOSIS — G8929 Other chronic pain: Secondary | ICD-10-CM | POA: Insufficient documentation

## 2023-06-29 DIAGNOSIS — M25512 Pain in left shoulder: Secondary | ICD-10-CM | POA: Insufficient documentation

## 2023-06-30 ENCOUNTER — Ambulatory Visit: Payer: Commercial Managed Care - PPO

## 2023-06-30 DIAGNOSIS — G8929 Other chronic pain: Secondary | ICD-10-CM | POA: Diagnosis not present

## 2023-06-30 DIAGNOSIS — M25512 Pain in left shoulder: Secondary | ICD-10-CM | POA: Diagnosis not present

## 2023-06-30 NOTE — Therapy (Signed)
OUTPATIENT PHYSICAL THERAPY SHOULDER EVALUATION   Patient Name: Sue [name] MRN: [id number] DOB:December 24, 1970, 52 y.o., female Today's Date: 06/30/2023   PT End of Session - 06/30/23 1842     Visit Number 3    Date for PT Re-Evaluation 08/05/23    Authorization Type [employer] - FOTO    PT Start Time 1701    PT Stop Time 1744    PT Time Calculation (min) 43 min    Activity Tolerance Patient tolerated treatment well    Behavior During Therapy [facility] for tasks assessed/performed              Past Medical History:  Diagnosis Date   Anxiety    Asthma    Basal cell carcinoma of skin of face 2007   Dr. Emily Filbert   Past Surgical History:  Procedure Laterality Date   MOHS SURGERY     removal of basal cell carcinoma   Patient Active Problem List   Diagnosis Date Noted   Rotator cuff tendinitis, left 09/07/2022   Exercise-induced shortness of breath 08/14/2022   Primary hypertension 08/13/2022   Basal cell carcinoma of skin 08/16/2018   Bilateral hearing loss 08/16/2018   Low back pain 08/16/2015   Cyst of left ovary 12/26/2011   Menses, irregular 09/17/2010   ANXIETY DISORDER, GENERALIZED 07/30/2009   Asthma 11/12/2008    PCP: Agapito Games, MD  REFERRING PROVIDER: Agapito Games, *  THERAPY DIAG:  Chronic left shoulder pain  REFERRING DIAG: Rotator cuff tendinitis, left [M75.82]   Rationale for Evaluation and Treatment:  Rehabilitation  SUBJECTIVE:  PERTINENT PAST HISTORY:  Anxiety         PRECAUTIONS: None  WEIGHT BEARING RESTRICTIONS No  FALLS:  Has patient fallen in last 6 months? No, Number of falls: 0  MOI/History of condition:  Onset date: >1 year  SUBJECTIVE STATEMENT  Patient reporting minimal pain at start of session, has been compliant with HEP.  She reports some elbow pain with isometric shoulder abduction/ER.    Red flags:  denies   Pain:  Are you having pain? Yes Pain location: L UT, diffuse L shoulder, L  side of neck NPRS scale:  2/10 to 4/10 Aggravating factors: OH movement Relieving factors: Neck massager Pain description: dull and aching Stage: Chronic 24 hour pattern: worse at the end of the day   Occupation: desk job  Assistive Device: NA  Hand Dominance: L  Patient Goals/Specific Activities: improve ROM and improve strength   OBJECTIVE:    DIAGNOSTIC FINDINGS:  None recent  GENERAL OBSERVATION:  Forward head, rounded shoulders     SENSATION:  Light touch: Appears intact   PALPATION: Popping L biceps tendon with shoulder movement  Cervical ROM  ROM ROM  (Eval)  Flexion n  Extension 30* neck  Right lateral flexion 40  Left lateral flexion 30* neck  Right rotation n  Left rotation Slight limitation with neck pain  Flexion rotation (normal is 30 degrees)   Flexion rotation (normal is 30 degrees)     (Blank rows = not tested, N = WNL, * = concordant pain)  UPPER EXTREMITY AROM:  ROM Right (Eval) Left (Eval) Left 06/30/23  Shoulder flexion 140 120* 140  Shoulder abduction 140 120* 135  Shoulder internal rotation     Shoulder external rotation     Functional IR T10 L1*   Functional ER T7 T7   Shoulder extension     Elbow extension     Elbow flexion      (  Blank rows = not tested, N = WNL, * = concordant pain with testing)  UPPER EXTREMITY MMT:  MMT Right (Eval) Left (Eval)  Shoulder flexion 4 4*  Shoulder abduction (C5) 4 4*  Shoulder ER 4+ 4*  Shoulder IR 4+ 4*  Middle trapezius    Lower trapezius    Shoulder extension    Grip strength    Cervical flexion (C1,C2)    Cervical S/B (C3)    Shoulder shrug (C4)    Elbow flexion (C6)  4+ no pain   Elbow ext (C7)    Thumb ext (C8)    Finger abd (T1)    Grossly     (Blank rows = not tested, score listed is out of 5 possible points.  N = WNL, D = diminished, C = clear for gross weakness with myotome testing, * = concordant pain with testing)   MUSCLE LENGTH:    Pec Major: L (+), R (+) for  restriction  UPPER EXTREMITY PROM:  PROM Right (Eval) Left (Eval)  Shoulder flexion    Shoulder abduction    Shoulder internal rotation    Shoulder external rotation    Functional IR    Functional ER    Shoulder extension    Elbow extension    Elbow flexion     (Blank rows = not tested, N = WNL, * = concordant pain with testing)  SPECIAL TESTS:  SAPS test cluster: (+)  Obrien's test (+)  PATIENT SURVEYS:  FOTO 54 -> 68    TODAY'S TREATMENT:   [facility] Adult PT Treatment:                                                DATE: 06/30/2023  Therapeutic Exercise: UBE, level 2.5, 2'/2' fwd/back  Cervical retraction, 5 sec hold x 20  Cervical snag extension, x 10 Scapular retraction, 3 sec x 15 Isometric shoulder flexion, extension, abduction, adduction at doorway, 5-second hold x 10 each  Manual Therapy: Long axis distraction, varying angles from 30-70 degrees abduction   Neuromuscular re-ed: Isometrics with clinician resistance 6 way (flex/ext/abd/add/extern, 3 x 90 sec  Isotonic PNF (repeated contraction) D1 flexion/ext & D2 flexion/ext, 2 x 6 each direction    [facility] Adult PT Treatment:                                                DATE: 06/16/2023  Therapeutic Exercise: Cervical retraction, 5 sec hold x 20  Cervical snag extension, x 20 Scapular retraction, 3 sec x 15 Isometric shoulder flexion at wall, 5 sec x5 Isometric shoulder ABD at wall, 5 sec x5 Updated and reviewed home program   Manual Therapy: Long axis distraction, varying angles from 30-70 degrees abduction   Neuromuscular re-ed: Isometrics with clinician resistance 6 way (flex/ext/abd/add/extern, 3 x 90 sec  Isotonic PNF (repeated contraction) D1 flexion/ext & D2 flexion/ext, 2 x 6 each direction     PATIENT EDUCATION:  POC, diagnosis, prognosis, HEP, and outcome measures.  Pt educated via explanation, demonstration, and handout (HEP).  Pt confirms understanding verbally.   HOME EXERCISE  PROGRAM: Access Code: [email or web address] URL: [email or web address] Date: 06/30/2023 Prepared by: Mauri Reading  Exercises - Seated Assisted Cervical Rotation with Towel  -  3 x daily - 7 x weekly - 2 sets - 15 reps - Cervical Extension AROM with Strap  - 1 x daily - 7 x weekly - 2 sets - 10 reps - Seated Passive Cervical Retraction  - 3-5 x daily - 7 x weekly - 2 sets - 10 reps - 5 second hold hold - Standing Isometric Shoulder Flexion with Doorway - Arm Bent  - 1 x daily - 7 x weekly - 2 sets - 10 reps - 5 sec hold - Standing Isometric Shoulder Abduction with Doorway - Arm Bent  - 1 x daily - 7 x weekly - 2 sets - 10 reps - 5 sec hold - Standing Isometric Shoulder Extension with Doorway - Arm Bent  - 1 x daily - 7 x weekly - 2 sets - 10 reps - 5 sec hold - Isometric Shoulder Adduction  - 1 x daily - 7 x weekly - 2 sets - 10 reps - 5 sec hold  Treatment priorities   Eval        R/C strengthening        Manual for UT and Cx spine (TDN?)                                  ASSESSMENT:  CLINICAL IMPRESSION: Dejanae had good tolerance of today's treatment session including addition of UBE.  Provided patient education regarding home exercise program with modification of the positioning for optimal muscle activation.  She is demonstrating improved range of motion compared to initial eval, and will continue to benefit from PNF for muscle activation, and upper extremity strengthening exercises.  OBJECTIVE IMPAIRMENTS: Pain, shoulder ROM, shoulder strength, cervical ROM  ACTIVITY LIMITATIONS: reaching, lifting, working  PERSONAL FACTORS: See medical history and pertinent history   REHAB POTENTIAL: Good  CLINICAL DECISION MAKING: Stable/uncomplicated  EVALUATION COMPLEXITY: Low   GOALS:   SHORT TERM GOALS: Target date: 07/08/2023   Yasmeen will be >75% HEP compliant to improve carryover between sessions and facilitate independent management of condition  Evaluation:  ongoing Goal status: INITIAL   LONG TERM GOALS: Target date: 08/05/2023   Valma will improve FOTO score to 68 as a proxy for functional improvement  Evaluation/Baseline: 54 Goal status: INITIAL    2.  Ramia will self report >/= 50% decrease in shoulder pain from evaluation   Evaluation/Baseline: 4/10 max pain Goal status: INITIAL   3.  Rehana will report confidence in self management of condition at time of discharge with advanced HEP  Evaluation/Baseline: unable to self manage Goal status: INITIAL    4.  Callia will improve the following MMTs to >/= 5/5 to show improvement in strength:     Evaluation/Baseline:   UPPER EXTREMITY MMT:  MMT Right (Eval) Left (Eval)  Shoulder flexion 4 4*  Shoulder abduction (C5) 4 4*  Shoulder ER 4+ 4  Shoulder IR 4+ 4*  Middle trapezius    Lower trapezius    Shoulder extension    Grip strength    Cervical flexion (C1,C2)    Cervical S/B (C3)    Shoulder shrug (C4)    Elbow flexion (C6)  4+ no pain   Elbow ext (C7)    Thumb ext (C8)    Finger abd (T1)    Grossly     (Blank rows = not tested, score listed is out of 5 possible points.  N = WNL, D = diminished, C =  clear for gross weakness with myotome testing, * = concordant pain with testing)  Goal status: INITIAL     PLAN: PT FREQUENCY: 1-2x/week  PT DURATION: 8 weeks  PLANNED INTERVENTIONS: Therapeutic exercises, Aquatic therapy, Therapeutic activity, Neuro Muscular re-education, Gait training, Patient/Family education, Joint mobilization, Dry Needling, Electrical stimulation, Spinal mobilization and/or manipulation, Moist heat, Taping, Vasopneumatic device, Ionotophoresis 4mg /ml Dexamethasone, and Manual therapy   Mauri Reading PT, DPT 06/30/2023, 6:43 PM

## 2023-07-07 ENCOUNTER — Ambulatory Visit: Payer: Commercial Managed Care - PPO

## 2023-07-07 DIAGNOSIS — M25512 Pain in left shoulder: Secondary | ICD-10-CM | POA: Diagnosis not present

## 2023-07-07 DIAGNOSIS — G8929 Other chronic pain: Secondary | ICD-10-CM

## 2023-07-07 NOTE — Therapy (Signed)
OUTPATIENT PHYSICAL THERAPY SHOULDER EVALUATION   Patient Name: Sue [name] MRN: [id number] DOB:Aug 23, 1971, 52 y.o., female Today's Date: 07/07/2023   PT End of Session - 07/07/23 1035     Visit Number 4    Date for PT Re-Evaluation 08/05/23    Authorization Type [employer] - FOTO    PT Start Time 1002    PT Stop Time 1042    PT Time Calculation (min) 40 min    Activity Tolerance Patient tolerated treatment well    Behavior During Therapy [facility] for tasks assessed/performed               Past Medical History:  Diagnosis Date   Anxiety    Asthma    Basal cell carcinoma of skin of face 2007   Dr. Emily Filbert   Past Surgical History:  Procedure Laterality Date   MOHS SURGERY     removal of basal cell carcinoma   Patient Active Problem List   Diagnosis Date Noted   Rotator cuff tendinitis, left 09/07/2022   Exercise-induced shortness of breath 08/14/2022   Primary hypertension 08/13/2022   Basal cell carcinoma of skin 08/16/2018   Bilateral hearing loss 08/16/2018   Low back pain 08/16/2015   Cyst of left ovary 12/26/2011   Menses, irregular 09/17/2010   ANXIETY DISORDER, GENERALIZED 07/30/2009   Asthma 11/12/2008    PCP: Agapito Games, MD  REFERRING PROVIDER: Agapito Games, *  THERAPY DIAG:  Chronic left shoulder pain  REFERRING DIAG: Rotator cuff tendinitis, left [M75.82]   Rationale for Evaluation and Treatment:  Rehabilitation  SUBJECTIVE:  PERTINENT PAST HISTORY:  Anxiety         PRECAUTIONS: None  WEIGHT BEARING RESTRICTIONS No  FALLS:  Has patient fallen in last 6 months? No, Number of falls: 0  MOI/History of condition:  Onset date: >1 year  SUBJECTIVE STATEMENT  Patient reporting no worsening of symptoms since last visit.     Red flags:  denies   Pain:  Are you having pain? Yes Pain location: L UT, diffuse L shoulder, L side of neck NPRS scale:  2/10 to 4/10 Aggravating factors: OH movement Relieving  factors: Neck massager Pain description: dull and aching Stage: Chronic 24 hour pattern: worse at the end of the day   Occupation: desk job  Assistive Device: NA  Hand Dominance: L  Patient Goals/Specific Activities: improve ROM and improve strength   OBJECTIVE:    DIAGNOSTIC FINDINGS:  None recent  GENERAL OBSERVATION:  Forward head, rounded shoulders     SENSATION:  Light touch: Appears intact   PALPATION: Popping L biceps tendon with shoulder movement  Cervical ROM  ROM ROM  (Eval)  Flexion n  Extension 30* neck  Right lateral flexion 40  Left lateral flexion 30* neck  Right rotation n  Left rotation Slight limitation with neck pain  Flexion rotation (normal is 30 degrees)   Flexion rotation (normal is 30 degrees)     (Blank rows = not tested, N = WNL, * = concordant pain)  UPPER EXTREMITY AROM:  ROM Right (Eval) Left (Eval) Left 06/30/23  Shoulder flexion 140 120* 140  Shoulder abduction 140 120* 135  Shoulder internal rotation     Shoulder external rotation     Functional IR T10 L1*   Functional ER T7 T7   Shoulder extension     Elbow extension     Elbow flexion      (Blank rows = not tested, N =  WNL, * = concordant pain with testing)  UPPER EXTREMITY MMT:  MMT Right (Eval) Left (Eval)  Shoulder flexion 4 4*  Shoulder abduction (C5) 4 4*  Shoulder ER 4+ 4*  Shoulder IR 4+ 4*  Middle trapezius    Lower trapezius    Shoulder extension    Grip strength    Cervical flexion (C1,C2)    Cervical S/B (C3)    Shoulder shrug (C4)    Elbow flexion (C6)  4+ no pain   Elbow ext (C7)    Thumb ext (C8)    Finger abd (T1)    Grossly     (Blank rows = not tested, score listed is out of 5 possible points.  N = WNL, D = diminished, C = clear for gross weakness with myotome testing, * = concordant pain with testing)   MUSCLE LENGTH:    Pec Major: L (+), R (+) for restriction  UPPER EXTREMITY PROM:  PROM Right (Eval) Left (Eval)  Shoulder  flexion    Shoulder abduction    Shoulder internal rotation    Shoulder external rotation    Functional IR    Functional ER    Shoulder extension    Elbow extension    Elbow flexion     (Blank rows = not tested, N = WNL, * = concordant pain with testing)  SPECIAL TESTS:  SAPS test cluster: (+)  Obrien's test (+)  PATIENT SURVEYS:  FOTO 54 -> 68    TODAY'S TREATMENT:   [facility] Adult PT Treatment:                                                DATE: 07/07/2023  Therapeutic Exercise: UBE, level 2.5, 2'/2' fwd/back  Finger ladder x 10 fwd, x 10 lateral/scaption  Pendulum, 3 x 20 sec  Cervical retraction, 5 sec hold x 15  Scapular retraction, 5 sec x 15 D2 flexion with red theraband, 2 x 15 D1 flexion with red theraband, 2 x 10  Resisted rows, green TB, 2 x 15  Shoulder extension/pulldowns with red TB, 2 x 15   [facility] Adult PT Treatment:                                                DATE: 06/30/2023  Therapeutic Exercise: UBE, level 2.5, 2'/2' fwd/back  Cervical retraction, 5 sec hold x 20  Cervical snag extension, x 10 Scapular retraction, 3 sec x 15 Isometric shoulder flexion, extension, abduction, adduction at doorway, 5-second hold x 10 each  Manual Therapy: Long axis distraction, varying angles from 30-70 degrees abduction   Neuromuscular re-ed: Isometrics with clinician resistance 6 way (flex/ext/abd/add/extern, 3 x 90 sec  Isotonic PNF (repeated contraction) D1 flexion/ext & D2 flexion/ext, 2 x 6 each direction    [facility] Adult PT Treatment:                                                DATE: 06/16/2023  Therapeutic Exercise: Cervical retraction, 5 sec hold x 20  Cervical snag extension, x 20 Scapular retraction, 3 sec x 15  Isometric shoulder flexion at wall, 5 sec x5 Isometric shoulder ABD at wall, 5 sec x5 Updated and reviewed home program   Manual Therapy: Long axis distraction, varying angles from 30-70 degrees abduction   Neuromuscular re-ed: Isometrics  with clinician resistance 6 way (flex/ext/abd/add/extern, 3 x 90 sec  Isotonic PNF (repeated contraction) D1 flexion/ext & D2 flexion/ext, 2 x 6 each direction     PATIENT EDUCATION:  POC, diagnosis, prognosis, HEP, and outcome measures.  Pt educated via explanation, demonstration, and handout (HEP).  Pt confirms understanding verbally.   HOME EXERCISE PROGRAM: Access Code: [email or web address] URL: [email or web address] Date: 06/30/2023 Prepared by: Mauri Reading  Exercises - Seated Assisted Cervical Rotation with Towel  - 3 x daily - 7 x weekly - 2 sets - 15 reps - Cervical Extension AROM with Strap  - 1 x daily - 7 x weekly - 2 sets - 10 reps - Seated Passive Cervical Retraction  - 3-5 x daily - 7 x weekly - 2 sets - 10 reps - 5 second hold hold - Standing Isometric Shoulder Flexion with Doorway - Arm Bent  - 1 x daily - 7 x weekly - 2 sets - 10 reps - 5 sec hold - Standing Isometric Shoulder Abduction with Doorway - Arm Bent  - 1 x daily - 7 x weekly - 2 sets - 10 reps - 5 sec hold - Standing Isometric Shoulder Extension with Doorway - Arm Bent  - 1 x daily - 7 x weekly - 2 sets - 10 reps - 5 sec hold - Isometric Shoulder Adduction  - 1 x daily - 7 x weekly - 2 sets - 10 reps - 5 sec hold  Treatment priorities   Eval        R/C strengthening        Manual for UT and Cx spine (TDN?)                                  ASSESSMENT:  CLINICAL IMPRESSION: Myriam was able to tolerate progression of exercises without exacerbation of symptoms. She did have some radicular symptoms with lateral finger ladder that was relieved with pendulum. Encouraged patient to continue with isometric activities at home and we will discontinue from treatment session at this time in order to progress AAROM and periscapular strengthening program.    OBJECTIVE IMPAIRMENTS: Pain, shoulder ROM, shoulder strength, cervical ROM  ACTIVITY LIMITATIONS: reaching, lifting, working  PERSONAL FACTORS: See  medical history and pertinent history   REHAB POTENTIAL: Good  CLINICAL DECISION MAKING: Stable/uncomplicated  EVALUATION COMPLEXITY: Low   GOALS:   SHORT TERM GOALS: Target date: 07/08/2023   Hannelore will be >75% HEP compliant to improve carryover between sessions and facilitate independent management of condition  Evaluation: ongoing Goal status: INITIAL   LONG TERM GOALS: Target date: 08/05/2023   Elsey will improve FOTO score to 68 as a proxy for functional improvement  Evaluation/Baseline: 54 Goal status: INITIAL    2.  Naliah will self report >/= 50% decrease in shoulder pain from evaluation   Evaluation/Baseline: 4/10 max pain Goal status: INITIAL   3.  Shawnice will report confidence in self management of condition at time of discharge with advanced HEP  Evaluation/Baseline: unable to self manage Goal status: INITIAL    4.  Reiley will improve the following MMTs to >/= 5/5 to show improvement in strength:     Evaluation/Baseline:   UPPER EXTREMITY  MMT:  MMT Right (Eval) Left (Eval)  Shoulder flexion 4 4*  Shoulder abduction (C5) 4 4*  Shoulder ER 4+ 4  Shoulder IR 4+ 4*  Middle trapezius    Lower trapezius    Shoulder extension    Grip strength    Cervical flexion (C1,C2)    Cervical S/B (C3)    Shoulder shrug (C4)    Elbow flexion (C6)  4+ no pain   Elbow ext (C7)    Thumb ext (C8)    Finger abd (T1)    Grossly     (Blank rows = not tested, score listed is out of 5 possible points.  N = WNL, D = diminished, C = clear for gross weakness with myotome testing, * = concordant pain with testing)  Goal status: INITIAL     PLAN: PT FREQUENCY: 1-2x/week  PT DURATION: 8 weeks  PLANNED INTERVENTIONS: Therapeutic exercises, Aquatic therapy, Therapeutic activity, Neuro Muscular re-education, Gait training, Patient/Family education, Joint mobilization, Dry Needling, Electrical stimulation, Spinal mobilization and/or manipulation, Moist  heat, Taping, Vasopneumatic device, Ionotophoresis 4mg /ml Dexamethasone, and Manual therapy   Mauri Reading PT, DPT 07/07/2023, 10:57 AM

## 2023-07-16 ENCOUNTER — Ambulatory Visit: Payer: Commercial Managed Care - PPO | Admitting: Physical Therapy

## 2023-07-21 ENCOUNTER — Ambulatory Visit: Payer: Commercial Managed Care - PPO

## 2023-07-21 DIAGNOSIS — M25512 Pain in left shoulder: Secondary | ICD-10-CM | POA: Diagnosis not present

## 2023-07-21 DIAGNOSIS — G8929 Other chronic pain: Secondary | ICD-10-CM

## 2023-07-21 NOTE — Therapy (Signed)
OUTPATIENT PHYSICAL THERAPY SHOULDER EVALUATION   Patient Name: Sue [name] MRN: [id number] DOB:10/19/1971, 52 y.o., female Today's Date: 07/21/2023   PT End of Session - 07/21/23 1707     Visit Number 5    Date for PT Re-Evaluation 08/05/23    Authorization Type [employer] - FOTO    PT Start Time 1707    PT Stop Time 1745    PT Time Calculation (min) 38 min    Activity Tolerance Patient tolerated treatment well    Behavior During Therapy [facility] for tasks assessed/performed                Past Medical History:  Diagnosis Date   Anxiety    Asthma    Basal cell carcinoma of skin of face 2007   Dr. Emily Filbert   Past Surgical History:  Procedure Laterality Date   MOHS SURGERY     removal of basal cell carcinoma   Patient Active Problem List   Diagnosis Date Noted   Rotator cuff tendinitis, left 09/07/2022   Exercise-induced shortness of breath 08/14/2022   Primary hypertension 08/13/2022   Basal cell carcinoma of skin 08/16/2018   Bilateral hearing loss 08/16/2018   Low back pain 08/16/2015   Cyst of left ovary 12/26/2011   Menses, irregular 09/17/2010   ANXIETY DISORDER, GENERALIZED 07/30/2009   Asthma 11/12/2008    PCP: Agapito Games, MD  REFERRING PROVIDER: Agapito Games, *  THERAPY DIAG:  No diagnosis found.  REFERRING DIAG: Rotator cuff tendinitis, left [M75.82]   Rationale for Evaluation and Treatment:  Rehabilitation  SUBJECTIVE:  PERTINENT PAST HISTORY:  Anxiety         PRECAUTIONS: None  WEIGHT BEARING RESTRICTIONS No  FALLS:  Has patient fallen in last 6 months? No, Number of falls: 0  MOI/History of condition:  Onset date: >1 year  SUBJECTIVE STATEMENT  Patient reports to PT with improved symptoms. 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 She feels that her exercises are still helpful.     Red flags:  denies   Pain:  Are you having pain? Yes Pain  location: L UT, diffuse L shoulder, L side of neck NPRS scale:  2/10 to 4/10 Aggravating factors: OH movement Relieving factors: Neck massager Pain description: dull and aching Stage: Chronic 24 hour pattern: worse at the end of the day   Occupation: desk job  Assistive Device: NA  Hand Dominance: L  Patient Goals/Specific Activities: improve ROM and improve strength   OBJECTIVE:    DIAGNOSTIC FINDINGS:  None recent  GENERAL OBSERVATION:  Forward head, rounded shoulders     SENSATION:  Light touch: Appears intact   PALPATION: Popping L biceps tendon with shoulder movement  Cervical ROM  ROM ROM  (Eval)  Flexion n  Extension 30* neck  Right lateral flexion 40  Left lateral flexion 30* neck  Right rotation n  Left rotation Slight limitation with neck pain  Flexion rotation (normal is 30 degrees)   Flexion rotation (normal is 30 degrees)     (Blank rows = not tested, N = WNL, * = concordant pain)  UPPER EXTREMITY AROM:  ROM Right (Eval) Left (Eval) Left 06/30/23  Shoulder flexion 140 120* 140  Shoulder abduction 140 120* 135  Shoulder internal rotation     Shoulder external rotation     Functional IR T10 L1*   Functional ER T7 T7   Shoulder  extension     Elbow extension     Elbow flexion      (Blank rows = not tested, N = WNL, * = concordant pain with testing)  UPPER EXTREMITY MMT:  MMT Right (Eval) Left (Eval)  Shoulder flexion 4 4*  Shoulder abduction (C5) 4 4*  Shoulder ER 4+ 4*  Shoulder IR 4+ 4*  Middle trapezius    Lower trapezius    Shoulder extension    Grip strength    Cervical flexion (C1,C2)    Cervical S/B (C3)    Shoulder shrug (C4)    Elbow flexion (C6)  4+ no pain   Elbow ext (C7)    Thumb ext (C8)    Finger abd (T1)    Grossly     (Blank rows = not tested, score listed is out of 5 possible points.  N = WNL, D = diminished, C = clear for gross weakness with myotome testing, * = concordant pain with testing)   MUSCLE  LENGTH:    Pec Major: L (+), R (+) for restriction  UPPER EXTREMITY PROM:  PROM Right (Eval) Left (Eval)  Shoulder flexion    Shoulder abduction    Shoulder internal rotation    Shoulder external rotation    Functional IR    Functional ER    Shoulder extension    Elbow extension    Elbow flexion     (Blank rows = not tested, N = WNL, * = concordant pain with testing)  SPECIAL TESTS:  SAPS test cluster: (+)  Obrien's test (+)  PATIENT SURVEYS:  FOTO 54 -> 68    TODAY'S TREATMENT:    [facility] Adult PT Treatment:                                                DATE: 07/21/2023  Therapeutic Exercise: UBE, level 2, 2.5'/2.5' fwd/back  Finger ladder x 10 fwd, x 10 lateral/scaption  Sleeper stretch, 2 x 10  IR stretch with towel, 2 x 5 Updated and reviewed HEP   Manual Therapy: PROM IR/ER  Long axis distraction    [facility] Adult PT Treatment:                                                DATE: 07/07/2023  Therapeutic Exercise: UBE, level 2.5, 2'/2' fwd/back  Finger ladder x 10 fwd, x 10 lateral/scaption  Pendulum, 3 x 20 sec  Cervical retraction, 5 sec hold x 15  Scapular retraction, 5 sec x 15 D2 flexion with red theraband, 2 x 15 D1 flexion with red theraband, 2 x 10  Resisted rows, green TB, 2 x 15  Shoulder extension/pulldowns with red TB, 2 x 15   [facility] Adult PT Treatment:                                                DATE: 06/30/2023  Therapeutic Exercise: UBE, level 2.5, 2'/2' fwd/back  Cervical retraction, 5 sec hold x 20  Cervical snag extension, x 10 Scapular retraction, 3 sec x 15 Isometric shoulder flexion, extension, abduction, adduction  at doorway, 5-second hold x 10 each  Manual Therapy: Long axis distraction, varying angles from 30-70 degrees abduction   Neuromuscular re-ed: Isometrics with clinician resistance 6 way (flex/ext/abd/add/extern, 3 x 90 sec  Isotonic PNF (repeated contraction) D1 flexion/ext & D2 flexion/ext, 2 x 6 each direction     [facility] Adult PT Treatment:                                                DATE: 06/16/2023  Therapeutic Exercise: Cervical retraction, 5 sec hold x 20  Cervical snag extension, x 20 Scapular retraction, 3 sec x 15 Isometric shoulder flexion at wall, 5 sec x5 Isometric shoulder ABD at wall, 5 sec x5 Updated and reviewed home program   Manual Therapy: Long axis distraction, varying angles from 30-70 degrees abduction   Neuromuscular re-ed: Isometrics with clinician resistance 6 way (flex/ext/abd/add/extern, 3 x 90 sec  Isotonic PNF (repeated contraction) D1 flexion/ext & D2 flexion/ext, 2 x 6 each direction     PATIENT EDUCATION:  POC, diagnosis, prognosis, HEP, and outcome measures.  Pt educated via explanation, demonstration, and handout (HEP).  Pt confirms understanding verbally.   HOME EXERCISE PROGRAM: Access Code: [email or web address] URL: [email or web address] Date: 07/21/2023 Prepared by: Mauri Reading  Exercises - Seated Assisted Cervical Rotation with Towel  - 3 x daily - 7 x weekly - 2 sets - 15 reps - Cervical Extension AROM with Strap  - 1 x daily - 7 x weekly - 2 sets - 10 reps - Seated Passive Cervical Retraction  - 3-5 x daily - 7 x weekly - 2 sets - 10 reps - 5 second hold hold - Standing Isometric Shoulder Flexion with Doorway - Arm Bent  - 1 x daily - 7 x weekly - 2 sets - 10 reps - 5 sec hold - Standing Isometric Shoulder Abduction with Doorway - Arm Bent  - 1 x daily - 7 x weekly - 2 sets - 10 reps - 5 sec hold - Standing Isometric Shoulder Extension with Doorway - Arm Bent  - 1 x daily - 7 x weekly - 2 sets - 10 reps - 5 sec hold - Isometric Shoulder Adduction  - 1 x daily - 7 x weekly - 2 sets - 10 reps - 5 sec hold - Sleeper Stretch  - 1 x daily - 7 x weekly - 2 sets - 10 reps - 5-10 sec hold - Standing Shoulder Internal Rotation Stretch with Towel  - 1 x daily - 7 x weekly - 2 sets - 10 reps - 3 sec hold  Treatment priorities   Eval        R/C  strengthening        Manual for UT and Cx spine (TDN?)                                  ASSESSMENT:  CLINICAL IMPRESSION: Gindy continues to respond well to physical therapy intervention.  Provided patient with updated home exercise program to include IR stretches.  Patient is to be independent with these exercises until next visit.  We will reassess need for additional physical therapy at that time.    OBJECTIVE IMPAIRMENTS: Pain, shoulder ROM, shoulder strength, cervical ROM  ACTIVITY LIMITATIONS: reaching, lifting, working  PERSONAL FACTORS: See medical  history and pertinent history   REHAB POTENTIAL: Good  CLINICAL DECISION MAKING: Stable/uncomplicated  EVALUATION COMPLEXITY: Low   GOALS:   SHORT TERM GOALS: Target date: 07/08/2023   Man will be >75% HEP compliant to improve carryover between sessions and facilitate independent management of condition  Evaluation: ongoing Goal status: INITIAL   LONG TERM GOALS: Target date: 08/05/2023   Tannette will improve FOTO score to 68 as a proxy for functional improvement  Evaluation/Baseline: 54 Goal status: INITIAL    2.  Nalaysia will self report >/= 50% decrease in shoulder pain from evaluation   Evaluation/Baseline: 4/10 max pain Goal status: INITIAL   3.  Azula will report confidence in self management of condition at time of discharge with advanced HEP  Evaluation/Baseline: unable to self manage Goal status: INITIAL    4.  Reiley will improve the following MMTs to >/= 5/5 to show improvement in strength:     Evaluation/Baseline:   UPPER EXTREMITY MMT:  MMT Right (Eval) Left (Eval)  Shoulder flexion 4 4*  Shoulder abduction (C5) 4 4*  Shoulder ER 4+ 4  Shoulder IR 4+ 4*  Middle trapezius    Lower trapezius    Shoulder extension    Grip strength    Cervical flexion (C1,C2)    Cervical S/B (C3)    Shoulder shrug (C4)    Elbow flexion (C6)  4+ no pain   Elbow ext (C7)    Thumb ext  (C8)    Finger abd (T1)    Grossly     (Blank rows = not tested, score listed is out of 5 possible points.  N = WNL, D = diminished, C = clear for gross weakness with myotome testing, * = concordant pain with testing)  Goal status: INITIAL     PLAN: PT FREQUENCY: 1-2x/week  PT DURATION: 8 weeks  PLANNED INTERVENTIONS: Therapeutic exercises, Aquatic therapy, Therapeutic activity, Neuro Muscular re-education, Gait training, Patient/Family education, Joint mobilization, Dry Needling, Electrical stimulation, Spinal mobilization and/or manipulation, Moist heat, Taping, Vasopneumatic device, Ionotophoresis 4mg /ml Dexamethasone, and Manual therapy   Mauri Reading PT, DPT 07/21/2023, 6:43 PM

## 2023-07-29 ENCOUNTER — Ambulatory Visit: Payer: Commercial Managed Care - PPO | Attending: Family Medicine

## 2023-07-29 DIAGNOSIS — G8929 Other chronic pain: Secondary | ICD-10-CM | POA: Insufficient documentation

## 2023-07-29 DIAGNOSIS — M25512 Pain in left shoulder: Secondary | ICD-10-CM | POA: Diagnosis not present

## 2023-07-29 DIAGNOSIS — M542 Cervicalgia: Secondary | ICD-10-CM | POA: Insufficient documentation

## 2023-07-29 DIAGNOSIS — M6281 Muscle weakness (generalized): Secondary | ICD-10-CM | POA: Diagnosis not present

## 2023-07-29 NOTE — Therapy (Signed)
OUTPATIENT PHYSICAL THERAPY SHOULDER TREATMENT NOTE   Patient Name: Sue [name] MRN: [id number] DOB:11/11/1971, 52 y.o., female Today's Date: 07/29/2023        Past Medical History:  Diagnosis Date   Anxiety    Asthma    Basal cell carcinoma of skin of face 2007   Dr. Emily Filbert   Past Surgical History:  Procedure Laterality Date   MOHS SURGERY     removal of basal cell carcinoma   Patient Active Problem List   Diagnosis Date Noted   Rotator cuff tendinitis, left 09/07/2022   Exercise-induced shortness of breath 08/14/2022   Primary hypertension 08/13/2022   Basal cell carcinoma of skin 08/16/2018   Bilateral hearing loss 08/16/2018   Low back pain 08/16/2015   Cyst of left ovary 12/26/2011   Menses, irregular 09/17/2010   ANXIETY DISORDER, GENERALIZED 07/30/2009   Asthma 11/12/2008    PCP: Agapito Games, MD  REFERRING PROVIDER: Agapito Games, *  THERAPY DIAG:  No diagnosis found.  REFERRING DIAG: Rotator cuff tendinitis, left M75.82  Rationale for Evaluation and Treatment:  Rehabilitation  SUBJECTIVE:  PERTINENT PAST HISTORY:  Anxiety         PRECAUTIONS: None  WEIGHT BEARING RESTRICTIONS No  FALLS:  Has patient fallen in last 6 months? No, Number of falls: 0  MOI/History of condition:  Onset date: >1 year  SUBJECTIVE STATEMENT  Patient reports that she is having 0/10 pain.     Red flags:  denies   Pain:  Are you having pain? Yes Pain location: L UT, diffuse L shoulder, L side of neck NPRS scale:  2/10 to 4/10 Aggravating factors: OH movement Relieving factors: Neck massager Pain description: dull and aching Stage: Chronic 24 hour pattern: worse at the end of the day   Occupation: desk job  Assistive Device: NA  Hand Dominance: L  Patient Goals/Specific Activities: improve ROM and improve strength   OBJECTIVE:    DIAGNOSTIC FINDINGS:  None recent  GENERAL OBSERVATION:  Forward head, rounded  shoulders     SENSATION:  Light touch: Appears intact   PALPATION: Popping L biceps tendon with shoulder movement  Cervical ROM  ROM ROM  (Eval)  Flexion n  Extension 30* neck  Right lateral flexion 40  Left lateral flexion 30* neck  Right rotation n  Left rotation Slight limitation with neck pain  Flexion rotation (normal is 30 degrees)   Flexion rotation (normal is 30 degrees)     (Blank rows = not tested, N = WNL, * = concordant pain)  UPPER EXTREMITY AROM:  ROM Right (Eval) Left (Eval) Left 06/30/23  Shoulder flexion 140 120* 140  Shoulder abduction 140 120* 135  Shoulder internal rotation     Shoulder external rotation     Functional IR T10 L1*   Functional ER T7 T7   Shoulder extension     Elbow extension     Elbow flexion      (Blank rows = not tested, N = WNL, * = concordant pain with testing)  UPPER EXTREMITY MMT:  MMT Right (Eval) Left (Eval)  Shoulder flexion 4 4*  Shoulder abduction (C5) 4 4*  Shoulder ER 4+ 4*  Shoulder IR 4+ 4*  Middle trapezius    Lower trapezius    Shoulder extension    Grip strength    Cervical flexion (C1,C2)    Cervical S/B (C3)    Shoulder shrug (C4)    Elbow flexion (C6)  4+ no pain  Elbow ext (C7)    Thumb ext (C8)    Finger abd (T1)    Grossly     (Blank rows = not tested, score listed is out of 5 possible points.  N = WNL, D = diminished, C = clear for gross weakness with myotome testing, * = concordant pain with testing)   MUSCLE LENGTH:    Pec Major: L (+), R (+) for restriction  UPPER EXTREMITY PROM:  PROM Right (Eval) Left (Eval)  Shoulder flexion    Shoulder abduction    Shoulder internal rotation    Shoulder external rotation    Functional IR    Functional ER    Shoulder extension    Elbow extension    Elbow flexion     (Blank rows = not tested, N = WNL, * = concordant pain with testing)  SPECIAL TESTS:  SAPS test cluster: (+)  Obrien's test (+)  PATIENT SURVEYS:  FOTO 54 ->  68    TODAY'S TREATMENT:    [facility] Adult PT Treatment:                                                DATE: 07/29/2023  Therapeutic Exercise: UBE, level 2, 2.5'/2.5' fwd/back  Finger ladder x 10 fwd, x scaption  Sleeper stretch, 2 x 10  IR stretch with towel, 2 x 5 Resisted D2 extension with GTB, 2 x 10  Resisted D1 flexion with GTB, 2 x 10 Isometric ER/IR walkouts x 15 each   S/L shoulder ER, Flexion, abduction 1# x 15     [facility] Adult PT Treatment:                                                DATE: 07/21/2023  Therapeutic Exercise: UBE, level 2, 2.5'/2.5' fwd/back  Finger ladder x 10 fwd, x 10 lateral/scaption  Sleeper stretch, 2 x 10  IR stretch with towel, 2 x 5 Updated and reviewed HEP   Manual Therapy: PROM IR/ER  Long axis distraction    [facility] Adult PT Treatment:                                                DATE: 07/07/2023  Therapeutic Exercise: UBE, level 2.5, 2'/2' fwd/back  Finger ladder x 10 fwd, x 10 lateral/scaption  Pendulum, 3 x 20 sec  Cervical retraction, 5 sec hold x 15  Scapular retraction, 5 sec x 15 D2 flexion with red theraband, 2 x 15 D1 flexion with red theraband, 2 x 10  Resisted rows, green TB, 2 x 15  Shoulder extension/pulldowns with red TB, 2 x 15    PATIENT EDUCATION:  POC, diagnosis, prognosis, HEP, and outcome measures.  Pt educated via explanation, demonstration, and handout (HEP).  Pt confirms understanding verbally.   HOME EXERCISE PROGRAM: Access Code: [email or web address] URL: [email or web address] Date: 07/21/2023 Prepared by: Mauri Reading  Exercises - Seated Assisted Cervical Rotation with Towel  - 3 x daily - 7 x weekly - 2 sets - 15 reps - Cervical Extension AROM with Strap  - 1 x daily - 7  x weekly - 2 sets - 10 reps - Seated Passive Cervical Retraction  - 3-5 x daily - 7 x weekly - 2 sets - 10 reps - 5 second hold hold - Standing Isometric Shoulder Flexion with Doorway - Arm Bent  - 1 x daily - 7 x weekly - 2  sets - 10 reps - 5 sec hold - Standing Isometric Shoulder Abduction with Doorway - Arm Bent  - 1 x daily - 7 x weekly - 2 sets - 10 reps - 5 sec hold - Standing Isometric Shoulder Extension with Doorway - Arm Bent  - 1 x daily - 7 x weekly - 2 sets - 10 reps - 5 sec hold - Isometric Shoulder Adduction  - 1 x daily - 7 x weekly - 2 sets - 10 reps - 5 sec hold - Sleeper Stretch  - 1 x daily - 7 x weekly - 2 sets - 10 reps - 5-10 sec hold - Standing Shoulder Internal Rotation Stretch with Towel  - 1 x daily - 7 x weekly - 2 sets - 10 reps - 3 sec hold  Treatment priorities   Eval        R/C strengthening        Manual for UT and Cx spine (TDN?)                                  ASSESSMENT:  CLINICAL IMPRESSION: Sue [name] was able to perform progression of shoulder stabilization activities today, and shoulder strengthening with resistance band.  She is demonstrating improved tolerance of internal rotation AROM, and encouraged ongoing performance of IR activities at home.  Plan is to continue with increased challenge of shoulder stabilization activities manage visit.  Discharge from skilled physical therapy with updated home exercise program next week.     OBJECTIVE IMPAIRMENTS: Pain, shoulder ROM, shoulder strength, cervical ROM  ACTIVITY LIMITATIONS: reaching, lifting, working  PERSONAL FACTORS: See medical history and pertinent history   REHAB POTENTIAL: Good  CLINICAL DECISION MAKING: Stable/uncomplicated  EVALUATION COMPLEXITY: Low   GOALS:   SHORT TERM GOALS: Target date: 07/08/2023   Sue [name] will be >75% HEP compliant to improve carryover between sessions and facilitate independent management of condition  Evaluation: ongoing Goal status: MET    LONG TERM GOALS: Target date: 08/05/2023   Sue [name] will improve FOTO score to 68 as a proxy for functional improvement  Evaluation/Baseline: 54 Goal status: INITIAL    2.  Sue [name] will self report >/= 50% decrease in  shoulder pain from evaluation   Evaluation/Baseline: 4/10 max pain Goal status: INITIAL   3.  Sue [name] will report confidence in self management of condition at time of discharge with advanced HEP  Evaluation/Baseline: unable to self manage Goal status: INITIAL    4.  Sue [name] will improve the following MMTs to >/= 5/5 to show improvement in strength:     Evaluation/Baseline:   UPPER EXTREMITY MMT:  MMT Right (Eval) Left (Eval)  Shoulder flexion 4 4*  Shoulder abduction (C5) 4 4*  Shoulder ER 4+ 4  Shoulder IR 4+ 4*  Middle trapezius    Lower trapezius    Shoulder extension    Grip strength    Cervical flexion (C1,C2)    Cervical S/B (C3)    Shoulder shrug (C4)    Elbow flexion (C6)  4+ no pain   Elbow ext (C7)    Thumb ext (C8)  Finger abd (T1)    Grossly     (Blank rows = not tested, score listed is out of 5 possible points.  N = WNL, D = diminished, C = clear for gross weakness with myotome testing, * = concordant pain with testing)  Goal status: INITIAL     PLAN: PT FREQUENCY: 1-2x/week  PT DURATION: 8 weeks  PLANNED INTERVENTIONS: Therapeutic exercises, Aquatic therapy, Therapeutic activity, Neuro Muscular re-education, Gait training, Patient/Family education, Joint mobilization, Dry Needling, Electrical stimulation, Spinal mobilization and/or manipulation, Moist heat, Taping, Vasopneumatic device, Ionotophoresis 4mg /ml Dexamethasone, and Manual therapy   Mauri Reading PT, DPT 07/29/2023, 6:49 PM

## 2023-08-06 ENCOUNTER — Ambulatory Visit: Payer: Commercial Managed Care - PPO

## 2023-08-06 DIAGNOSIS — M25512 Pain in left shoulder: Secondary | ICD-10-CM | POA: Diagnosis not present

## 2023-08-06 DIAGNOSIS — G8929 Other chronic pain: Secondary | ICD-10-CM | POA: Diagnosis not present

## 2023-08-06 DIAGNOSIS — M542 Cervicalgia: Secondary | ICD-10-CM | POA: Diagnosis not present

## 2023-08-06 DIAGNOSIS — M6281 Muscle weakness (generalized): Secondary | ICD-10-CM | POA: Diagnosis not present

## 2023-08-06 NOTE — Therapy (Signed)
OUTPATIENT PHYSICAL THERAPY   PHYSICAL THERAPY DISCHARGE SUMMARY  Visits from Start of Care: 7  Current functional level related to goals / functional outcomes: See objective findings/assessment    Remaining deficits: See objective findings/assessment    Education / Equipment: See today's treatment/assessment      Patient agrees to discharge. Patient goals were met. Patient is being discharged due to meeting the stated rehab goals.    Patient Name: Sue [name] MRN: [id number] DOB:[birth year], 52 y.o., female Today's Date: 08/06/2023   PT End of Session - 08/06/23 0832     Visit Number 7    Date for PT Re-Evaluation 08/05/23    Authorization Type [place] [employer] - FOTO    PT Start Time [phone]    PT Stop Time 0912    PT Time Calculation (min) 40 min    Activity Tolerance Patient tolerated treatment well    Behavior During Therapy [facility] for tasks assessed/performed              Past Medical History:  Diagnosis Date   Anxiety    Asthma    Basal cell carcinoma of skin of face 2007   Dr. Emily Filbert   Past Surgical History:  Procedure Laterality Date   MOHS SURGERY     removal of basal cell carcinoma   Patient Active Problem List   Diagnosis Date Noted   Rotator cuff tendinitis, left 09/07/2022   Exercise-induced shortness of breath 08/14/2022   Primary hypertension 08/13/2022   Basal cell carcinoma of skin 08/16/2018   Bilateral hearing loss 08/16/2018   Low back pain 08/16/2015   Cyst of left ovary 12/26/2011   Menses, irregular 09/17/2010   ANXIETY DISORDER, GENERALIZED 07/30/2009   Asthma 11/12/2008    PCP: Agapito Games, MD  REFERRING PROVIDER: Agapito Games, *  THERAPY DIAG:  Chronic left shoulder pain  REFERRING DIAG: Rotator cuff tendinitis, left M75.82  Rationale for Evaluation and Treatment:  Rehabilitation  SUBJECTIVE:  PERTINENT PAST HISTORY:  Anxiety         PRECAUTIONS: None  WEIGHT BEARING RESTRICTIONS  No  FALLS:  Has patient fallen in last 6 months? No, Number of falls: 0  MOI/History of condition:  Onset date: >1 year  SUBJECTIVE STATEMENT  Patient states that she has been feeling better over the last few weeks. She is agreeable to discharge from PT today. "I know I'll do well if I can keep up with my exercises."     Red flags:  denies   Pain:  Are you having pain? Yes Pain location: L UT, diffuse L shoulder, L side of neck NPRS scale:  2/10 to 4/10 Aggravating factors: OH movement Relieving factors: Neck massager Pain description: dull and aching Stage: Chronic 24 hour pattern: worse at the end of the day   Occupation: desk job  Assistive Device: NA  Hand Dominance: L  Patient Goals/Specific Activities: improve ROM and improve strength   OBJECTIVE:    DIAGNOSTIC FINDINGS:  None recent  GENERAL OBSERVATION:  Forward head, rounded shoulders     SENSATION:  Light touch: Appears intact   PALPATION: Popping L biceps tendon with shoulder movement  Cervical ROM  ROM ROM  (Eval) ROM 08/06/23  Flexion n WFL  Extension 30* neck WFL   Right lateral flexion 40 WFL, sore/tightness  Left lateral flexion 30* neck WFL, sore/tightness  Right rotation n   Left rotation Slight limitation with neck pain WFL, sore/tightness  Flexion rotation (normal is 30 degrees)  Flexion rotation (normal is 30 degrees)      (Blank rows = not tested, N = WNL, * = concordant pain)  UPPER EXTREMITY AROM:  ROM Right (Eval) Left (Eval) Left 06/30/23 Left 08/06/23  Shoulder flexion 140 120* 140 170  Shoulder abduction 140 120* 135 170  Shoulder internal rotation      Shoulder external rotation      Functional IR T10 L1*    Functional ER T7 T7    Shoulder extension      Elbow extension      Elbow flexion       (Blank rows = not tested, N = WNL, * = concordant pain with testing)  UPPER EXTREMITY MMT:  MMT Right (Eval) Left (Eval) Right 08/06/23 Left 08/06/23   Shoulder flexion 4 4* 4+ 4+  Shoulder abduction (C5) 4 4* 4+ 4, soreness  Shoulder ER 4+ 4* 4+ 4+  Shoulder IR 4+ 4* 4+ 4*  Middle trapezius      Lower trapezius      Shoulder extension      Grip strength      Cervical flexion (C1,C2)      Cervical S/B (C3)      Shoulder shrug (C4)      Elbow flexion (C6)  4+ no pain     Elbow ext (C7)      Thumb ext (C8)      Finger abd (T1)      Grossly       (Blank rows = not tested, score listed is out of 5 possible points.  N = WNL, D = diminished, C = clear for gross weakness with myotome testing, * = concordant pain with testing)   MUSCLE LENGTH:    Pec Major: L (+), R (+) for restriction  UPPER EXTREMITY PROM:  PROM Right (Eval) Left (Eval)  Shoulder flexion    Shoulder abduction    Shoulder internal rotation    Shoulder external rotation    Functional IR    Functional ER    Shoulder extension    Elbow extension    Elbow flexion     (Blank rows = not tested, N = WNL, * = concordant pain with testing)  SPECIAL TESTS:  SAPS test cluster: (+)  Obrien's test (+)  PATIENT SURVEYS:  FOTO 54 -> 68    TODAY'S TREATMENT:   [facility] Adult PT Treatment:                                                DATE: 08/06/2023  Therapeutic Exercise: UBE, level 2, 2.5'/2.5' fwd/back  Finger ladder x 10 fwd, x scaption  Resisted D2 extension with GTB, 2 x 10  Resisted D1 flexion with GTB, 2 x 10 Isometric ER/IR walkouts x 15 each    Therapeutic Activity:  Reassessment of objective measures and subjective assessment regarding progress towards established goals and plan for independence with prescribed home program following discharged from PT    [facility] Adult PT Treatment:                                                DATE: 07/29/2023  Therapeutic Exercise: UBE, level 2, 2.5'/2.5' fwd/back  Finger ladder x 10  fwd, x scaption  Sleeper stretch, 2 x 10  IR stretch with towel, 2 x 5 Resisted D2 extension with GTB, 2 x 10  Resisted D1  flexion with GTB, 2 x 10 Isometric ER/IR walkouts x 15 each   S/L shoulder ER, Flexion, abduction 1# x 15    [facility] Adult PT Treatment:                                                DATE: 07/21/2023  Therapeutic Exercise: UBE, level 2, 2.5'/2.5' fwd/back  Finger ladder x 10 fwd, x 10 lateral/scaption  Sleeper stretch, 2 x 10  IR stretch with towel, 2 x 5 Updated and reviewed HEP   Manual Therapy: PROM IR/ER  Long axis distraction    [facility] Adult PT Treatment:                                                DATE: 07/07/2023  Therapeutic Exercise: UBE, level 2.5, 2'/2' fwd/back  Finger ladder x 10 fwd, x 10 lateral/scaption  Pendulum, 3 x 20 sec  Cervical retraction, 5 sec hold x 15  Scapular retraction, 5 sec x 15 D2 flexion with red theraband, 2 x 15 D1 flexion with red theraband, 2 x 10  Resisted rows, green TB, 2 x 15  Shoulder extension/pulldowns with red TB, 2 x 15    PATIENT EDUCATION:  POC, diagnosis, prognosis, HEP, and outcome measures.  Pt educated via explanation, demonstration, and handout (HEP).  Pt confirms understanding verbally.   HOME EXERCISE PROGRAM: Access Code: [email or web address] URL: [email or web address] Date: 08/06/2023 Prepared by: Mauri Reading  Exercises - Seated Assisted Cervical Rotation with Towel  - 3 x daily - 7 x weekly - 2 sets - 15 reps - Cervical Extension AROM with Strap  - 1 x daily - 7 x weekly - 2 sets - 10 reps - Seated Passive Cervical Retraction  - 3-5 x daily - 7 x weekly - 2 sets - 10 reps - 5 second hold hold - Sleeper Stretch  - 1 x daily - 7 x weekly - 2 sets - 10 reps - 5-10 sec hold - Standing Shoulder Internal Rotation Stretch with Towel  - 1 x daily - 7 x weekly - 2 sets - 10 reps - 3 sec hold - Standing Isometric Shoulder Flexion with Doorway - Arm Bent  - 1 x daily - 7 x weekly - 1 sets - 10 reps - 10 sec hold - Standing Isometric Shoulder Abduction with Doorway - Arm Bent  - 1 x daily - 7 x weekly - 1 sets - 10  reps - 10 sec hold - Standing Isometric Shoulder Extension with Doorway - Arm Bent  - 1 x daily - 7 x weekly - 1 sets - 10 reps - 10 sec hold - Isometric Shoulder Adduction  - 1 x daily - 7 x weekly - 1 sets - 10 reps - 10 sec hold - Shoulder External Rotation Reactive Isometrics  - 1 x daily - 7 x weekly - 2 sets - 10 reps - Standing Shoulder Single Arm PNF D2 Flexion with Resistance  - 1 x daily - 7 x weekly - 2 sets - 10 reps - Standing  Single Arm Shoulder PNF D1 Extension with Anchored Resistance  - 1 x daily - 7 x weekly - 2 sets - 10 reps - Standing Single Arm Shoulder Abduction with Resistance  - 1 x daily - 7 x weekly - 2 sets - 10 reps  Treatment priorities   Eval        R/C strengthening        Manual for UT and Cx spine (TDN?)                                  ASSESSMENT:  CLINICAL IMPRESSION: Jasilyn has attended 7 total PT sessions, has been fairly compliant with her home exercise program. She has met all established  goals, including improved CS AROM, improved UE strength, and decreased pain levels. She will be discharged from skilled PT at this time. Provided patient with updated home exercise program, which she is expected to remain independent with.     OBJECTIVE IMPAIRMENTS: Pain, shoulder ROM, shoulder strength, cervical ROM  ACTIVITY LIMITATIONS: reaching, lifting, working  PERSONAL FACTORS: See medical history and pertinent history   REHAB POTENTIAL: Good  CLINICAL DECISION MAKING: Stable/uncomplicated  EVALUATION COMPLEXITY: Low   GOALS:   SHORT TERM GOALS: Target date: 07/08/2023   Eirene will be >75% HEP compliant to improve carryover between sessions and facilitate independent management of condition  Evaluation: ongoing Goal status: MET    LONG TERM GOALS: Target date: 08/05/2023   Promise will improve FOTO score to 68 as a proxy for functional improvement  Evaluation/Baseline: 54 08/06/23: 68 Goal status: MET   2.  Lakea will  self report >/= 50% decrease in shoulder pain from evaluation   Evaluation/Baseline: 4/10 max pain 08/06/23: 2/10 pain  Goal status: MET    3.  Mairen will report confidence in self management of condition at time of discharge with advanced HEP  Evaluation/Baseline: unable to self manage Goal status: MET     4.  Char will improve the following MMTs to >/= 5/5 to show improvement in strength:     Evaluation/Baseline:   UPPER EXTREMITY MMT:  MMT Right (Eval) Left (Eval)  Shoulder flexion 4 4*  Shoulder abduction (C5) 4 4*  Shoulder ER 4+ 4  Shoulder IR 4+ 4*  Middle trapezius    Lower trapezius    Shoulder extension    Grip strength    Cervical flexion (C1,C2)    Cervical S/B (C3)    Shoulder shrug (C4)    Elbow flexion (C6)  4+ no pain   Elbow ext (C7)    Thumb ext (C8)    Finger abd (T1)    Grossly     (Blank rows = not tested, score listed is out of 5 possible points.  N = WNL, D = diminished, C = clear for gross weakness with myotome testing, * = concordant pain with testing)  Goal status: NEARLY MET      PLAN: PT FREQUENCY: 1-2x/week  PT DURATION: 8 weeks  PLANNED INTERVENTIONS: Therapeutic exercises, Aquatic therapy, Therapeutic activity, Neuro Muscular re-education, Gait training, Patient/Family education, Joint mobilization, Dry Needling, Electrical stimulation, Spinal mobilization and/or manipulation, Moist heat, Taping, Vasopneumatic device, Ionotophoresis 4mg /ml Dexamethasone, and Manual therapy   Mauri Reading PT, DPT 08/06/2023, 10:55 AM

## 2023-09-16 DIAGNOSIS — Z139 Encounter for screening, unspecified: Secondary | ICD-10-CM | POA: Diagnosis not present

## 2023-09-16 DIAGNOSIS — Z1239 Encounter for other screening for malignant neoplasm of breast: Secondary | ICD-10-CM | POA: Diagnosis not present

## 2023-09-16 DIAGNOSIS — Z01419 Encounter for gynecological examination (general) (routine) without abnormal findings: Secondary | ICD-10-CM | POA: Diagnosis not present

## 2023-09-16 DIAGNOSIS — Z1211 Encounter for screening for malignant neoplasm of colon: Secondary | ICD-10-CM | POA: Diagnosis not present

## 2023-09-16 DIAGNOSIS — Z1331 Encounter for screening for depression: Secondary | ICD-10-CM | POA: Diagnosis not present

## 2023-09-16 DIAGNOSIS — Z124 Encounter for screening for malignant neoplasm of cervix: Secondary | ICD-10-CM | POA: Diagnosis not present

## 2023-10-07 ENCOUNTER — Ambulatory Visit: Payer: Commercial Managed Care - PPO | Admitting: Family Medicine

## 2023-10-13 DIAGNOSIS — D225 Melanocytic nevi of trunk: Secondary | ICD-10-CM | POA: Diagnosis not present

## 2023-10-13 DIAGNOSIS — L578 Other skin changes due to chronic exposure to nonionizing radiation: Secondary | ICD-10-CM | POA: Diagnosis not present

## 2023-10-13 DIAGNOSIS — L570 Actinic keratosis: Secondary | ICD-10-CM | POA: Diagnosis not present

## 2023-10-13 DIAGNOSIS — L719 Rosacea, unspecified: Secondary | ICD-10-CM | POA: Diagnosis not present

## 2023-10-13 DIAGNOSIS — L821 Other seborrheic keratosis: Secondary | ICD-10-CM | POA: Diagnosis not present

## 2023-10-13 DIAGNOSIS — L219 Seborrheic dermatitis, unspecified: Secondary | ICD-10-CM | POA: Diagnosis not present

## 2023-10-13 DIAGNOSIS — L738 Other specified follicular disorders: Secondary | ICD-10-CM | POA: Diagnosis not present

## 2023-10-13 DIAGNOSIS — Z85828 Personal history of other malignant neoplasm of skin: Secondary | ICD-10-CM | POA: Diagnosis not present

## 2023-10-13 DIAGNOSIS — Z808 Family history of malignant neoplasm of other organs or systems: Secondary | ICD-10-CM | POA: Diagnosis not present

## 2023-10-13 DIAGNOSIS — D2372 Other benign neoplasm of skin of left lower limb, including hip: Secondary | ICD-10-CM | POA: Diagnosis not present

## 2023-10-20 ENCOUNTER — Ambulatory Visit: Payer: Commercial Managed Care - PPO | Admitting: Family Medicine

## 2023-10-20 ENCOUNTER — Other Ambulatory Visit (HOSPITAL_COMMUNITY): Payer: Self-pay

## 2023-10-20 ENCOUNTER — Encounter: Payer: Self-pay | Admitting: Family Medicine

## 2023-10-20 VITALS — BP 118/82 | HR 89 | Resp 14 | Ht 66.0 in | Wt 178.1 lb

## 2023-10-20 DIAGNOSIS — F411 Generalized anxiety disorder: Secondary | ICD-10-CM

## 2023-10-20 DIAGNOSIS — R0602 Shortness of breath: Secondary | ICD-10-CM | POA: Diagnosis not present

## 2023-10-20 DIAGNOSIS — I10 Essential (primary) hypertension: Secondary | ICD-10-CM | POA: Diagnosis not present

## 2023-10-20 DIAGNOSIS — J4520 Mild intermittent asthma, uncomplicated: Secondary | ICD-10-CM

## 2023-10-20 MED ORDER — CLONAZEPAM 0.25 MG PO TBDP
0.2500 mg | ORAL_TABLET | Freq: Every day | ORAL | 0 refills | Status: DC | PRN
  Filled 2023-10-20: qty 10, 10d supply, fill #0

## 2023-10-20 NOTE — Assessment & Plan Note (Signed)
Continue current regimen Follow up in 6 months.

## 2023-10-20 NOTE — Assessment & Plan Note (Signed)
Well controlled. Continue current regimen. Follow up in  6 mo  

## 2023-10-20 NOTE — Progress Notes (Signed)
   Established Patient Office Visit  Subjective   Patient ID: JENNICA ALTEN, female    DOB: Sep 15, 1971  Age: 52 y.o. MRN: [id number]  Chief Complaint  Patient presents with   Hypertension   Asthma    HPI  F/U Asthma - Has times where feels SOB with talking.  No cough or wheezing.  Not as active. Has gained some weight and wonders if contributing.    Hypertension- Pt denies chest pain, SOB, dizziness, or heart palpitations.  Taking meds as directed w/o problems.  Denies medication side effects.    Got flu vac through work, plans on getting COVID vac at pharmacy.   Anxiety -doing well on sertraline.  Uses the clonazepam as needed sparingly.  No concerns or problems happy with current regimen.    ROS    Objective:     BP 118/82   Pulse 89   Resp 14   Ht 5\' 6"  (1.676 m)   Wt 178 lb 1.3 oz (80.8 kg)   SpO2 97%   BMI 28.74 kg/m    Physical Exam Vitals and nursing note reviewed.  Constitutional:      Appearance: Normal appearance.  HENT:     Head: Normocephalic and atraumatic.  Eyes:     Conjunctiva/sclera: Conjunctivae normal.  Cardiovascular:     Rate and Rhythm: Normal rate and regular rhythm.  Pulmonary:     Effort: Pulmonary effort is normal.     Breath sounds: Normal breath sounds.  Skin:    General: Skin is warm and dry.  Neurological:     Mental Status: She is alert.  Psychiatric:        Mood and Affect: Mood normal.      No results found for any visits on 10/20/23.    The 10-year ASCVD risk score (Arnett DK, et al., 2019) is: 0.9%    Assessment & Plan:   Problem List Items Addressed This Visit       Cardiovascular and Mediastinum   Primary hypertension - Primary    Well controlled. Continue current regimen. Follow up in  87mo       Relevant Orders   CMP14+EGFR   Lipid panel   CBC     Respiratory   Asthma    Has tried using the peak flow meter and has been getting some good readings so I think that short of breath sensation  that she gets with talking is probably more related to being in a sitting position for a little bit longer possibly a little bit of the weight gain could be contributing she does get that more pressure full sensation in her abdomen at times.  Encouraged her to get back into her regular exercise and can pretreat with her albuterol if needed.  No recent full exacerbations.        Other   ANXIETY DISORDER, GENERALIZED    Continue current regimen.  Follow-up in 6 months.      Relevant Medications   clonazePAM (KLONOPIN) 0.25 MG disintegrating tablet   If intermittent short of breath sensation continues we could always work up further with a chest x-ray or if it is progressing or becoming more frequent or worse.  Return in about 6 months (around 04/18/2024) for Hypertension.    Nani Gasser, MD

## 2023-10-20 NOTE — Assessment & Plan Note (Signed)
Has tried using the peak flow meter and has been getting some good readings so I think that short of breath sensation that she gets with talking is probably more related to being in a sitting position for a little bit longer possibly a little bit of the weight gain could be contributing she does get that more pressure full sensation in her abdomen at times.  Encouraged her to get back into her regular exercise and can pretreat with her albuterol if needed.  No recent full exacerbations.

## 2023-10-21 LAB — CBC
Hematocrit: 44 % (ref 34.0–46.6)
Hemoglobin: 14.3 g/dL (ref 11.1–15.9)
MCH: 30.2 pg (ref 26.6–33.0)
MCHC: 32.5 g/dL (ref 31.5–35.7)
MCV: 93 fL (ref 79–97)
Platelets: 220 10*3/uL (ref 150–450)
RBC: 4.73 x10E6/uL (ref 3.77–5.28)
RDW: 11.5 % — ABNORMAL LOW (ref 11.7–15.4)
WBC: 6.3 10*3/uL (ref 3.4–10.8)

## 2023-10-21 LAB — CMP14+EGFR
ALT: 24 [IU]/L (ref 0–32)
AST: 21 [IU]/L (ref 0–40)
Albumin: 4.8 g/dL (ref 3.9–4.9)
Alkaline Phosphatase: 76 [IU]/L (ref 44–121)
BUN/Creatinine Ratio: 17 (ref 9–23)
BUN: 15 mg/dL (ref 6–24)
Bilirubin Total: 0.5 mg/dL (ref 0.0–1.2)
CO2: 24 mmol/L (ref 20–29)
Calcium: 9.7 mg/dL (ref 8.7–10.2)
Chloride: 102 mmol/L (ref 96–106)
Creatinine, Ser: 0.86 mg/dL (ref 0.57–1.00)
Globulin, Total: 2.7 g/dL (ref 1.5–4.5)
Glucose: 72 mg/dL (ref 70–99)
Potassium: 4 mmol/L (ref 3.5–5.2)
Sodium: 143 mmol/L (ref 134–144)
Total Protein: 7.5 g/dL (ref 6.0–8.5)
eGFR: 82 mL/min/{1.73_m2} (ref >59)

## 2023-10-21 LAB — LIPID PANEL
Chol/HDL Ratio: 2.8 {ratio} (ref 0.0–4.4)
Cholesterol, Total: 150 mg/dL (ref 100–199)
HDL: 54 mg/dL (ref >39)
LDL Chol Calc (NIH): 83 mg/dL (ref 0–99)
Triglycerides: 65 mg/dL (ref 0–149)
VLDL Cholesterol Cal: 13 mg/dL (ref 5–40)

## 2023-10-25 NOTE — Progress Notes (Signed)
[place], hemoglobin looks good no sign of anemia.  Metabolic panel including liver and kidney function stable.  Cholesterol looks great.

## 2023-11-09 DIAGNOSIS — H524 Presbyopia: Secondary | ICD-10-CM | POA: Diagnosis not present

## 2023-11-22 ENCOUNTER — Other Ambulatory Visit: Payer: Self-pay | Admitting: Family Medicine

## 2023-11-22 DIAGNOSIS — F411 Generalized anxiety disorder: Secondary | ICD-10-CM

## 2023-11-26 ENCOUNTER — Other Ambulatory Visit (HOSPITAL_COMMUNITY): Payer: Self-pay

## 2023-11-26 MED ORDER — SERTRALINE HCL 100 MG PO TABS
100.0000 mg | ORAL_TABLET | Freq: Every day | ORAL | 3 refills | Status: DC
  Filled 2023-11-26: qty 90, 90d supply, fill #0
  Filled 2024-02-20: qty 90, 90d supply, fill #1
  Filled 2024-05-22: qty 90, 90d supply, fill #2
  Filled 2024-08-26: qty 90, 90d supply, fill #3

## 2023-12-23 ENCOUNTER — Other Ambulatory Visit: Payer: Self-pay | Admitting: Family Medicine

## 2023-12-23 DIAGNOSIS — I10 Essential (primary) hypertension: Secondary | ICD-10-CM

## 2023-12-24 ENCOUNTER — Other Ambulatory Visit (HOSPITAL_COMMUNITY): Payer: Self-pay

## 2023-12-24 ENCOUNTER — Other Ambulatory Visit: Payer: Self-pay

## 2023-12-24 MED ORDER — LOSARTAN POTASSIUM-HCTZ 50-12.5 MG PO TABS
1.0000 | ORAL_TABLET | Freq: Every day | ORAL | 3 refills | Status: AC
  Filled 2023-12-24 (×2): qty 90, 90d supply, fill #0
  Filled 2024-04-10: qty 90, 90d supply, fill #1
  Filled 2024-07-10: qty 90, 90d supply, fill #2
  Filled 2024-10-21: qty 90, 90d supply, fill #3

## 2023-12-27 ENCOUNTER — Other Ambulatory Visit (HOSPITAL_COMMUNITY): Payer: Self-pay

## 2024-01-20 DIAGNOSIS — L818 Other specified disorders of pigmentation: Secondary | ICD-10-CM | POA: Diagnosis not present

## 2024-01-20 DIAGNOSIS — L738 Other specified follicular disorders: Secondary | ICD-10-CM | POA: Diagnosis not present

## 2024-01-20 DIAGNOSIS — L570 Actinic keratosis: Secondary | ICD-10-CM | POA: Diagnosis not present

## 2024-07-13 ENCOUNTER — Encounter: Payer: Self-pay | Admitting: Family Medicine

## 2024-07-13 ENCOUNTER — Other Ambulatory Visit (HOSPITAL_COMMUNITY): Payer: Self-pay

## 2024-07-13 ENCOUNTER — Telehealth (INDEPENDENT_AMBULATORY_CARE_PROVIDER_SITE_OTHER): Admitting: Family Medicine

## 2024-07-13 DIAGNOSIS — J019 Acute sinusitis, unspecified: Secondary | ICD-10-CM

## 2024-07-13 DIAGNOSIS — H938X2 Other specified disorders of left ear: Secondary | ICD-10-CM

## 2024-07-13 MED ORDER — AMOXICILLIN-POT CLAVULANATE 875-125 MG PO TABS
1.0000 | ORAL_TABLET | Freq: Two times a day (BID) | ORAL | 0 refills | Status: AC
  Filled 2024-07-13: qty 14, 7d supply, fill #0

## 2024-07-13 MED ORDER — PREDNISONE 20 MG PO TABS
40.0000 mg | ORAL_TABLET | Freq: Every day | ORAL | 0 refills | Status: AC
  Filled 2024-07-13: qty 10, 5d supply, fill #0

## 2024-07-13 MED ORDER — FLUTICASONE PROPIONATE 50 MCG/ACT NA SUSP
2.0000 | Freq: Every day | NASAL | 2 refills | Status: AC
  Filled 2024-07-13: qty 16, 30d supply, fill #0
  Filled 2024-09-22: qty 16, 30d supply, fill #1

## 2024-07-13 NOTE — Progress Notes (Signed)
    Virtual Visit via Video Note  I connected with Sue [name] on 07/13/24 at  1:00 PM EDT by a video enabled telemedicine application and verified that I am speaking with the correct person using two identifiers.   I discussed the limitations of evaluation and management by telemedicine and the availability of in person appointments. The patient expressed understanding and agreed to proceed.  Patient location: at home Provider location: in office  Subjective:    CC:  No chief complaint on file.   HPI:   Pt reports that she just got back from a trip and her ear get stopped up and she gets a lot of drainage, sore throat and she has to get on a plane again this weekend and doesn't want to continue feeling like this. She stated that  her L ear never completely opened back up. She reports that the drainage is going down into her throat.    Sudafed decongestant this did help in the beginning but is not helping now. She denies any f/s/c/n/v.    She does have some green color to the nasal discharge. No fever.   + drainage.      Wil fly again this weekend.     Past medical history, Surgical history, Family history not pertinant except as noted below, Social history, Allergies, and medications have been entered into the medical record, reviewed, and corrections made.    Objective:    General: Speaking clearly in complete sentences without any shortness of breath.  Alert and oriented x3.  Normal judgment. No apparent acute distress.    Impression and Recommendations:    Problem List Items Addressed This Visit   None Visit Diagnoses       Ear fullness, left    -  Primary   Relevant Medications   amoxicillin -clavulanate (AUGMENTIN ) 875-125 MG tablet   fluticasone  (FLONASE ) 50 MCG/ACT nasal spray   predniSONE  (DELTASONE ) 20 MG tablet     Acute non-recurrent sinusitis, unspecified location       Relevant Medications   amoxicillin -clavulanate (AUGMENTIN ) 875-125 MG tablet    fluticasone  (FLONASE ) 50 MCG/ACT nasal spray   predniSONE  (DELTASONE ) 20 MG tablet       Start augmentin , flonase . Can start prednisone  if needed if ear still full and tight before flies this weekend. Call if not better in one week.   No orders of the defined types were placed in this encounter.   Meds ordered this encounter  Medications   amoxicillin -clavulanate (AUGMENTIN ) 875-125 MG tablet    Sig: Take 1 tablet by mouth 2 (two) times daily.    Dispense:  14 tablet    Refill:  0   fluticasone  (FLONASE ) 50 MCG/ACT nasal spray    Sig: Place 2 sprays into both nostrils daily.    Dispense:  16 g    Refill:  2   predniSONE  (DELTASONE ) 20 MG tablet    Sig: Take 2 tablets (40 mg total) by mouth daily with breakfast.    Dispense:  10 tablet    Refill:  0     I discussed the assessment and treatment plan with the patient. The patient was provided an opportunity to ask questions and all were answered. The patient agreed with the plan and demonstrated an understanding of the instructions.   The patient was advised to call back or seek an in-person evaluation if the symptoms worsen or if the condition fails to improve as anticipated.   Dorothyann Byars, MD

## 2024-07-13 NOTE — Progress Notes (Signed)
 Pt reports that she just got back from a trip and her ear get stopped up and she gets a lot of drainage, sore throat and she has to get on a plane again this weekend and doesn't want to continue feeling like this. She stated that  her L ear never completely opened back up. She reports that the drainage is going down into her throat.   Sudafed decongestant this did help in the beginning but is not helping now. She denies any f/s/c/n/v.   She does have some green color to the nasal discharge.

## 2024-09-09 ENCOUNTER — Other Ambulatory Visit (HOSPITAL_BASED_OUTPATIENT_CLINIC_OR_DEPARTMENT_OTHER): Payer: Self-pay

## 2024-09-09 MED ORDER — FLUZONE 0.5 ML IM SUSY
0.5000 mL | PREFILLED_SYRINGE | Freq: Once | INTRAMUSCULAR | 0 refills | Status: AC
  Filled 2024-09-09: qty 0.5, 1d supply, fill #0

## 2024-09-22 ENCOUNTER — Other Ambulatory Visit: Payer: Self-pay | Admitting: Family Medicine

## 2024-09-22 DIAGNOSIS — F411 Generalized anxiety disorder: Secondary | ICD-10-CM

## 2024-09-25 ENCOUNTER — Other Ambulatory Visit (HOSPITAL_BASED_OUTPATIENT_CLINIC_OR_DEPARTMENT_OTHER): Payer: Self-pay

## 2024-09-25 MED ORDER — CLONAZEPAM 0.25 MG PO TBDP
0.2500 mg | ORAL_TABLET | Freq: Every day | ORAL | 0 refills | Status: AC | PRN
  Filled 2024-09-25: qty 10, 10d supply, fill #0

## 2024-09-27 ENCOUNTER — Other Ambulatory Visit (HOSPITAL_BASED_OUTPATIENT_CLINIC_OR_DEPARTMENT_OTHER): Payer: Self-pay

## 2024-12-08 ENCOUNTER — Other Ambulatory Visit: Payer: Self-pay | Admitting: Family Medicine

## 2024-12-08 DIAGNOSIS — F411 Generalized anxiety disorder: Secondary | ICD-10-CM

## 2024-12-11 ENCOUNTER — Other Ambulatory Visit (HOSPITAL_COMMUNITY): Payer: Self-pay

## 2024-12-11 MED ORDER — SERTRALINE HCL 100 MG PO TABS
100.0000 mg | ORAL_TABLET | Freq: Every day | ORAL | 0 refills | Status: AC
  Filled 2024-12-11: qty 30, 30d supply, fill #0

## 2024-12-29 ENCOUNTER — Encounter: Payer: Self-pay | Admitting: *Deleted

## 2024-12-29 NOTE — Progress Notes (Unsigned)
 This pt attended 12/30/2023 screening event and BP was 121/81. Pt did not indicate any SDOH needs at this time.   At event pt indicated having a PCP and noted Private as insurance. Pt also indicated that she is not a smoker.
